# Patient Record
Sex: Male | Born: 1945 | Race: White | Hispanic: No | Marital: Married | Smoking: Former smoker
Health system: Southern US, Community
[De-identification: ages and names within clinical notes are randomized; demographics above are authoritative.]

## PROBLEM LIST (undated history)

## (undated) DIAGNOSIS — I509 Heart failure, unspecified: Secondary | ICD-10-CM

## (undated) DIAGNOSIS — E119 Type 2 diabetes mellitus without complications: Secondary | ICD-10-CM

## (undated) DIAGNOSIS — Z85528 Personal history of other malignant neoplasm of kidney: Secondary | ICD-10-CM

## (undated) DIAGNOSIS — N189 Chronic kidney disease, unspecified: Secondary | ICD-10-CM

## (undated) DIAGNOSIS — Z86718 Personal history of other venous thrombosis and embolism: Secondary | ICD-10-CM

## (undated) HISTORY — PX: NEPHRECTOMY: SHX65

## (undated) HISTORY — DX: Personal history of other venous thrombosis and embolism: Z86.718

## (undated) HISTORY — PX: APPENDECTOMY: SHX54

## (undated) HISTORY — DX: Chronic kidney disease, unspecified: N18.9

## (undated) HISTORY — DX: Personal history of other malignant neoplasm of kidney: Z85.528

## (undated) HISTORY — DX: Type 2 diabetes mellitus without complications: E11.9

---

## 1999-05-23 ENCOUNTER — Encounter: Payer: Self-pay | Admitting: Urology

## 1999-05-23 ENCOUNTER — Ambulatory Visit (HOSPITAL_COMMUNITY): Admission: RE | Admit: 1999-05-23 | Discharge: 1999-05-23 | Payer: Self-pay | Admitting: Urology

## 2002-04-09 ENCOUNTER — Encounter: Admission: RE | Admit: 2002-04-09 | Discharge: 2002-04-09 | Payer: Self-pay | Admitting: Urology

## 2002-04-09 ENCOUNTER — Encounter: Payer: Self-pay | Admitting: Urology

## 2005-12-22 ENCOUNTER — Emergency Department (HOSPITAL_COMMUNITY): Admission: EM | Admit: 2005-12-22 | Discharge: 2005-12-22 | Payer: Self-pay | Admitting: Emergency Medicine

## 2010-06-08 DEATH — deceased

## 2016-05-09 ENCOUNTER — Other Ambulatory Visit: Payer: Self-pay | Admitting: *Deleted

## 2016-05-09 DIAGNOSIS — R202 Paresthesia of skin: Secondary | ICD-10-CM

## 2016-05-29 ENCOUNTER — Ambulatory Visit (INDEPENDENT_AMBULATORY_CARE_PROVIDER_SITE_OTHER): Payer: 59 | Admitting: Neurology

## 2016-05-29 DIAGNOSIS — G629 Polyneuropathy, unspecified: Secondary | ICD-10-CM

## 2016-05-29 DIAGNOSIS — R202 Paresthesia of skin: Secondary | ICD-10-CM | POA: Diagnosis not present

## 2016-05-29 NOTE — Procedures (Signed)
[facility]  [place], [place]  [place], [place] [address] Tel: [phone] Fax:  [phone] Test Date:  05/29/2016  Patient: Melvin Ramirez DOB: [birth year] Physician: Narda Amber, DO  Sex: Male Height: 6\' 2"  Ref Phys: [name]  ID#: [id number] Temp: 32.0C Technician:    Patient Complaints: This is a 70 year-old male referred for paresthesias of the feet.   NCV & EMG Findings: Extensive electrodiagnostic testing of the right lower extremity and additional studies of the left shows: 1. Bilateral superficial peroneal sensory responses are absent. Bilateral sural sensory responses are within normal limits. 2. Right peroneal and tibial motor responses are within normal limits. Left peroneal and tibial motor responses show modest conduction velocity slowing; the left peroneal motor amplitude is also reduced. 3. Right tibial H reflex study is absent. Left tibial H reflex study is prolonged. 4. Modest chronic motor axon loss changes are seen affecting the tibialis anterior and flexor digitorum longus muscles, without accompanied active denervation.  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 Overall, these findings are moderate in degree electrically.    ___________________________ Narda Amber, DO    Nerve Conduction Studies Anti Sensory Summary Table   Site NR Peak (ms) Norm Peak (ms) P-T Amp (V) Norm P-T Amp  Left Sup Peroneal Anti Sensory (Ant Lat Mall)  12 cm NR  <4.6  >3  Right Sup Peroneal Anti Sensory (Ant Lat Mall)  12 cm NR  <4.6  >3  Left Sural Anti Sensory (Lat Mall)  Calf    4.8 <4.6 3.7 >3  Right Sural Anti Sensory (Lat Mall)  Calf    4.2 <4.6 3.5 >3   Motor Summary Table   Site NR Onset (ms) Norm Onset (ms) O-P Amp (mV) Norm O-P Amp Site1 Site2 Delta-0 (ms) Dist (cm) Vel (m/s) Norm Vel (m/s)    Left Peroneal Motor (Ext Dig Brev)  Ankle    4.6 <6.0 1.3 >2.5 B Fib Ankle 12.3 38.0 31 >40  B Fib    16.9  1.4  Poplt B Fib 1.9 8.0 42 >40  Poplt    18.8  1.1         Right Peroneal Motor (Ext Dig Brev)  Ankle    3.0 <6.0 5.2 >2.5 B Fib Ankle 10.4 38.0 37 >40  B Fib    13.4  4.2  Poplt B Fib 0.9 8.0 89 >40  Poplt    14.3  4.0         Left Tibial Motor (Abd Hall Brev)  Ankle    4.6 <6.0 2.5 >4 Knee Ankle 0.5 0.0  >40  Knee    4.1  5.0         Site 3    15.8  2.9         Right Tibial Motor (Abd Hall Brev)  Ankle    4.2 <6.0 4.3 >4 Knee Ankle 11.0 45.0 41 >40  Knee    15.2  2.8          H Reflex Studies   NR H-Lat (ms) Lat Norm (ms) L-R H-Lat (ms)  Left Tibial (Gastroc)     52.11 <35   Right Tibial (Gastroc)  NR  <35    EMG   Side Muscle Ins Act Fibs Psw Fasc Number Recrt Dur Dur. Amp Amp. Poly Poly. Comment  Right AntTibialis Nml Nml Nml Nml 1- Rapid  Few 1+ Few 1+ Nml Nml N/A  Right Gastroc Nml Nml Nml Nml Nml Nml Nml Nml Nml Nml Nml Nml N/A  Right Flex Dig Long Nml Nml Nml Nml 1- Rapid Some 1+ Some 1+ Nml Nml N/A  Right RectFemoris Nml Nml Nml Nml Nml Nml Nml Nml Nml Nml Nml Nml N/A  Right GluteusMed Nml Nml Nml Nml Nml Nml Nml Nml Nml Nml Nml Nml N/A      Waveforms:

## 2016-07-05 ENCOUNTER — Ambulatory Visit (INDEPENDENT_AMBULATORY_CARE_PROVIDER_SITE_OTHER): Payer: 59 | Admitting: Neurology

## 2016-07-05 ENCOUNTER — Encounter: Payer: Self-pay | Admitting: Neurology

## 2016-07-05 ENCOUNTER — Other Ambulatory Visit: Payer: Self-pay | Admitting: Neurology

## 2016-07-05 ENCOUNTER — Other Ambulatory Visit (INDEPENDENT_AMBULATORY_CARE_PROVIDER_SITE_OTHER): Payer: 59

## 2016-07-05 VITALS — BP 110/80 | HR 57 | Ht 74.0 in | Wt 256.5 lb

## 2016-07-05 DIAGNOSIS — E0842 Diabetes mellitus due to underlying condition with diabetic polyneuropathy: Secondary | ICD-10-CM | POA: Insufficient documentation

## 2016-07-05 LAB — VITAMIN B12: Vitamin B-12: 274 pg/mL (ref 211–911)

## 2016-07-05 NOTE — Progress Notes (Signed)
Note routed

## 2016-07-05 NOTE — Patient Instructions (Addendum)
1.  Check blood work  2.  Continue with your diet and exercise plan to control diabetes  Return to clinic as needed, if your symptoms get worse

## 2016-07-05 NOTE — Progress Notes (Signed)
[facility] Clinic Note - Initial Visit   Date: 07/05/16  Melvin Ramirez MRN: [id number] DOB: May 30, 1946   Dear Dr. Moreen Fowler:  Thank you for your kind referral of Melvin Ramirez for consultation of neuropathy. Although his history is well known to you, please allow Melvin Ramirez to reiterate it for the purpose of our medical record. The patient was accompanied to the clinic by wife who also provides collateral information.     History of Present Illness: Melvin Ramirez is a 70 y.o. right-handed Caucasian male with history of renal cell carcinoma s/p left nephrectomy (1999), former smoker, and diet controlled diabetes mellitus (HbA1c 6.5) presenting for evaluation of neuropathy.    Starting around 2015, he began feeling needle sensation over the soles which was worse in the morning.  Within about 15 minutes, they would improve.  He no longer has tingling sensation, but now has constant feeling of always wearing sock. Sensation of numbness does not involve the dorsum of the foot or above the ankles.  He does not have weakness or low back pain.  He walk independently, but endorses imbalance.  Fortunately, he has not had any stumbles or falls.  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 Overall, these findings are moderate in degree electrically.  Labs 05/07/2016:  TSH 1.98, HbA1c 6.5  Past Medical History:  Diagnosis Date  . Diabetes mellitus ([place])   . History of kidney cancer     Past Surgical History:  Procedure Laterality Date  . NEPHRECTOMY       Medications:  Outpatient Encounter Prescriptions as of 07/05/2016  Medication Sig Note  . Ascorbic Acid (VITAMIN C) 100 MG tablet as directed  07/05/2016: Received from: [facility]  . calcium gluconate 1 g, magnesium sulfate 1 g in sodium chloride 0.9 % 100 mL 1 tab 07/05/2016: Received from: [facility]  . esomeprazole (NEXIUM 24HR) 20 MG capsule one capsule 30 minutes before dinner 07/05/2016: Received from: [facility]  . Garlic 409 MG CAPS as directed 07/05/2016: Received from: [facility]  . Glucosamine 500 MG CAPS as directed 07/05/2016: Received from: [facility]  . Multiple Vitamin (MULTIVITAMIN) capsule as directed 07/05/2016: Received from: [facility]  . Omega-3 Fatty Acids (FISH OIL) 1000 MG CAPS 1 capsule 07/05/2016: Received from: [facility]  . Saw Palmetto 500 MG CAPS as directed 07/05/2016: Received from: [facility]   No facility-administered encounter medications on file as of 07/05/2016.      Allergies:  Allergies  Allergen Reactions  . Other     Other reaction(s): passes out    Family History: Family History  Problem Relation Age of Onset  . Cancer Mother   . Heart attack Father   . Heart attack Brother     Social History: Social History  Substance Use Topics  . Smoking status: Former Smoker    Packs/day: 2.00    Years: 35.00    Types: Cigarettes    Quit date: 2000  . Smokeless tobacco: Never Used  . Alcohol use Yes     Comment: Socially   Social History   Social History Narrative   Lives  with wife in a one story home.  Has 3 children.  Works as a Administrator.  Education: 12th grade.    Review of Systems:  CONSTITUTIONAL: No fevers, chills, night sweats, or weight loss.   EYES: No visual changes or eye pain ENT: No hearing changes.  No history of nose bleeds.   RESPIRATORY: No cough, wheezing and shortness of breath.   CARDIOVASCULAR: Negative for chest pain, and palpitations.   GI: Negative for abdominal  discomfort, blood in stools or black stools.  No recent change in bowel habits.   GU:  No history of incontinence.   MUSCLOSKELETAL: No history of joint pain or swelling.  No myalgias.   SKIN: Negative for lesions, rash, and itching.   HEMATOLOGY/ONCOLOGY: Negative for prolonged bleeding, bruising easily, and swollen nodes.  +history of cancer.   ENDOCRINE: Negative for cold or heat intolerance, polydipsia or goiter.   PSYCH:  No depression or anxiety symptoms.   NEURO: As Above.   Vital Signs:  BP 110/80   Pulse (!) 57   Ht 6\' 2"  (1.88 m) Comment: 2  Wt 256 lb 8 oz (116.3 kg)   SpO2 95%   BMI 32.93 kg/m    General Medical Exam:   General:  Well appearing, comfortable.   Eyes/ENT: see cranial nerve examination.   Neck: No masses appreciated.  Full range of motion without tenderness.  No carotid bruits. Respiratory:  Clear to auscultation, good air entry bilaterally.   Cardiac:  Regular rate and rhythm, no murmur.   Extremities:  No deformities, edema, or skin discoloration.  Skin:  No rashes or lesions.  Neurological Exam: MENTAL STATUS including orientation to time, place, person, recent and remote memory, attention span and concentration, language, and fund of knowledge is normal.  Speech is not dysarthric.  CRANIAL NERVES: II:  No visual field defects.  Unremarkable fundi.   III-IV-VI: Pupils equal round and reactive to light.  Normal conjugate, extra-ocular eye movements in all directions of gaze.  No nystagmus.  No ptosis.   V:  Normal facial sensation.    VII:  Normal facial symmetry and movements.  No pathologic facial reflexes.  VIII:  Normal hearing and vestibular function.   IX-X:  Normal palatal movement.   XI:  Normal shoulder shrug and head rotation.   XII:  Normal tongue strength and range of motion, no deviation or fasciculation.  MOTOR:  No atrophy, fasciculations or abnormal movements.  No pronator drift.  Tone is normal.    Right Upper Extremity:    Left  Upper Extremity:    Deltoid  5/5   Deltoid  5/5   Biceps  5/5   Biceps  5/5   Triceps  5/5   Triceps  5/5   Wrist extensors  5/5   Wrist extensors  5/5   Wrist flexors  5/5   Wrist flexors  5/5   Finger extensors  5/5   Finger extensors  5/5   Finger flexors  5/5   Finger flexors  5/5   Dorsal interossei  5/5   Dorsal interossei  5/5   Abductor pollicis  5/5   Abductor pollicis  5/5   Tone (Ashworth scale)  0  Tone (Ashworth scale)  0   Right Lower Extremity:    Left Lower Extremity:    Hip flexors  5/5   Hip flexors  5/5   Hip extensors  5/5   Hip extensors  5/5   Knee flexors  5/5  Knee flexors  5/5   Knee extensors  5/5   Knee extensors  5/5   Dorsiflexors  5/5   Dorsiflexors  5/5   Plantarflexors  5/5   Plantarflexors  5/5   Toe extensors  5/5   Toe extensors  5/5   Toe flexors  5/5   Toe flexors  5/5   Tone (Ashworth scale)  0  Tone (Ashworth scale)  0   MSRs:  Right                                                                 Left brachioradialis 2+  brachioradialis 2+  biceps 2+  biceps 2+  triceps 2+  triceps 2+  patellar 2+  patellar 2+  ankle jerk 1+  ankle jerk 1+  Hoffman no  Hoffman no  plantar response down  plantar response down   SENSORY:  Diminished temperature distal to ankles and markedly reduced vibration at the great toe (worse on the left).  Pin prick, light touch, and proprioception intact.  Romberg's sign is present.   COORDINATION/GAIT: Normal finger-to- nose-finger.  Intact rapid alternating movements bilaterally.  Able to rise from a chair without using arms.  Gait narrow based and stable. Mild unsteadiness with tandem gait, but able to perform.  Stressed gait intact.   IMPRESSION: Mr. Self is a 70 year-old male with bilateral feet paresthesias. His neurological examination shows a distal predominant large fiber peripheral neuropathy.  He was recently diagnosed with diabetes which he is trying to manage with diet and lifestyle modification, and I  suspect this is the primary cause for neuropathy. I had extensive discussion with the patient regarding the pathogenesis, etiology, management, and natural course of neuropathy. Neuropathy tends to be slowly progressive, especially if underlying etiology is adequately managed.  At this time, he does not have any painful paresthesias and gait is not severe where he needs therapy.  Certainly, if symptoms progress, this can be readdressed.   PLAN/RECOMMENDATIONS:  1.  Check vitamin B12, vitamin B1, copper, SPEP with IFE 2.  Lifestyle modification for diabetes encouraged and is scheduled to follow-up with his PCP next week 3.  Fall precautions discussed  Return to clinic as needed.   The duration of this appointment visit was 50 minutes of face-to-face time with the patient.  Greater than 50% of this time was spent in counseling, explanation of diagnosis, planning of further management, and coordination of care.   Thank you for allowing me to participate in patient's care.  If I can answer any additional questions, I would be pleased to do so.    Sincerely,    Donika K. Posey Pronto, DO

## 2016-07-07 LAB — COPPER, SERUM: COPPER: 78 ug/dL (ref 72–166)

## 2016-07-08 LAB — VITAMIN B1: VITAMIN B1 (THIAMINE): 13 nmol/L (ref 8–30)

## 2016-07-09 LAB — PROTEIN ELECTROPHORESIS, SERUM
ALBUMIN ELP: 4 g/dL (ref 3.8–4.8)
ALPHA-2-GLOBULIN: 0.6 g/dL (ref 0.5–0.9)
Alpha-1-Globulin: 0.2 g/dL (ref 0.2–0.3)
Beta 2: 0.3 g/dL (ref 0.2–0.5)
Beta Globulin: 0.4 g/dL (ref 0.4–0.6)
Gamma Globulin: 1.1 g/dL (ref 0.8–1.7)
Total Protein, Serum Electrophoresis: 6.6 g/dL (ref 6.1–8.1)

## 2016-07-10 LAB — IMMUNOFIXATION ELECTROPHORESIS
IGA: 269 mg/dL (ref 81–463)
IGG (IMMUNOGLOBIN G), SERUM: 1400 mg/dL (ref 694–1618)
IgM, Serum: 56 mg/dL (ref 48–271)

## 2016-07-11 ENCOUNTER — Telehealth: Payer: Self-pay | Admitting: Neurology

## 2016-07-11 NOTE — Telephone Encounter (Signed)
-----   Message from Alda Berthold, DO sent at 07/11/2016 12:47 PM EDT ----- Melvin Ramirez, please inform patient that his vitamin B12 level is low-normal at 274 (normal range 211-911) and with his neuropathy I would like for him to start vitamin B12 1079mcg oral supplements daily.  Thanks.

## 2016-07-11 NOTE — Telephone Encounter (Signed)
Left message on machine for patient to call back.

## 2016-07-12 NOTE — Telephone Encounter (Signed)
Mychart message sent to patient.

## 2016-07-13 NOTE — Telephone Encounter (Signed)
Wife called back and I made her aware.

## 2016-08-06 ENCOUNTER — Ambulatory Visit: Payer: 59 | Admitting: Neurology

## 2017-05-08 DEATH — deceased

## 2018-06-23 DIAGNOSIS — N401 Enlarged prostate with lower urinary tract symptoms: Secondary | ICD-10-CM | POA: Diagnosis not present

## 2018-06-23 DIAGNOSIS — Z0000 Encounter for general adult medical examination without abnormal findings: Secondary | ICD-10-CM | POA: Diagnosis not present

## 2018-06-23 DIAGNOSIS — Z125 Encounter for screening for malignant neoplasm of prostate: Secondary | ICD-10-CM | POA: Diagnosis not present

## 2018-06-23 DIAGNOSIS — E1140 Type 2 diabetes mellitus with diabetic neuropathy, unspecified: Secondary | ICD-10-CM | POA: Diagnosis not present

## 2018-06-23 DIAGNOSIS — E7800 Pure hypercholesterolemia, unspecified: Secondary | ICD-10-CM | POA: Diagnosis not present

## 2018-06-23 DIAGNOSIS — R202 Paresthesia of skin: Secondary | ICD-10-CM | POA: Diagnosis not present

## 2018-06-23 DIAGNOSIS — Z1211 Encounter for screening for malignant neoplasm of colon: Secondary | ICD-10-CM | POA: Diagnosis not present

## 2018-06-23 DIAGNOSIS — K219 Gastro-esophageal reflux disease without esophagitis: Secondary | ICD-10-CM | POA: Diagnosis not present

## 2018-06-23 DIAGNOSIS — Z85528 Personal history of other malignant neoplasm of kidney: Secondary | ICD-10-CM | POA: Diagnosis not present

## 2018-11-25 DIAGNOSIS — J0110 Acute frontal sinusitis, unspecified: Secondary | ICD-10-CM | POA: Diagnosis not present

## 2019-07-16 DIAGNOSIS — N529 Male erectile dysfunction, unspecified: Secondary | ICD-10-CM | POA: Diagnosis not present

## 2019-07-16 DIAGNOSIS — R202 Paresthesia of skin: Secondary | ICD-10-CM | POA: Diagnosis not present

## 2019-07-16 DIAGNOSIS — Z1211 Encounter for screening for malignant neoplasm of colon: Secondary | ICD-10-CM | POA: Diagnosis not present

## 2019-07-16 DIAGNOSIS — N401 Enlarged prostate with lower urinary tract symptoms: Secondary | ICD-10-CM | POA: Diagnosis not present

## 2019-07-16 DIAGNOSIS — Z85528 Personal history of other malignant neoplasm of kidney: Secondary | ICD-10-CM | POA: Diagnosis not present

## 2019-07-16 DIAGNOSIS — E1140 Type 2 diabetes mellitus with diabetic neuropathy, unspecified: Secondary | ICD-10-CM | POA: Diagnosis not present

## 2019-07-16 DIAGNOSIS — Z125 Encounter for screening for malignant neoplasm of prostate: Secondary | ICD-10-CM | POA: Diagnosis not present

## 2019-07-16 DIAGNOSIS — Z0000 Encounter for general adult medical examination without abnormal findings: Secondary | ICD-10-CM | POA: Diagnosis not present

## 2019-07-16 DIAGNOSIS — K219 Gastro-esophageal reflux disease without esophagitis: Secondary | ICD-10-CM | POA: Diagnosis not present

## 2019-07-16 DIAGNOSIS — D692 Other nonthrombocytopenic purpura: Secondary | ICD-10-CM | POA: Diagnosis not present

## 2019-07-16 DIAGNOSIS — E7800 Pure hypercholesterolemia, unspecified: Secondary | ICD-10-CM | POA: Diagnosis not present

## 2019-08-18 DIAGNOSIS — N401 Enlarged prostate with lower urinary tract symptoms: Secondary | ICD-10-CM | POA: Diagnosis not present

## 2019-08-18 DIAGNOSIS — R350 Frequency of micturition: Secondary | ICD-10-CM | POA: Diagnosis not present

## 2019-10-22 DIAGNOSIS — B349 Viral infection, unspecified: Secondary | ICD-10-CM | POA: Diagnosis not present

## 2019-10-23 ENCOUNTER — Other Ambulatory Visit: Payer: Self-pay | Admitting: Unknown Physician Specialty

## 2019-10-23 ENCOUNTER — Encounter (HOSPITAL_COMMUNITY): Payer: Self-pay

## 2019-10-23 ENCOUNTER — Telehealth: Payer: Self-pay | Admitting: Nurse Practitioner

## 2019-10-23 ENCOUNTER — Telehealth: Payer: Self-pay | Admitting: Unknown Physician Specialty

## 2019-10-23 ENCOUNTER — Ambulatory Visit (HOSPITAL_COMMUNITY)
Admission: RE | Admit: 2019-10-23 | Discharge: 2019-10-23 | Disposition: A | Discharge status: Home / Self Care | Payer: Commercial Managed Care - PPO | Source: Ambulatory Visit | Attending: Pulmonary Disease | Admitting: Pulmonary Disease

## 2019-10-23 DIAGNOSIS — E0842 Diabetes mellitus due to underlying condition with diabetic polyneuropathy: Secondary | ICD-10-CM

## 2019-10-23 DIAGNOSIS — E1169 Type 2 diabetes mellitus with other specified complication: Secondary | ICD-10-CM | POA: Insufficient documentation

## 2019-10-23 DIAGNOSIS — Z23 Encounter for immunization: Secondary | ICD-10-CM | POA: Diagnosis not present

## 2019-10-23 DIAGNOSIS — U071 COVID-19: Secondary | ICD-10-CM | POA: Insufficient documentation

## 2019-10-23 MED ORDER — DIPHENHYDRAMINE HCL 50 MG/ML IJ SOLN: 50.0000 mg | Freq: Once | INTRAMUSCULAR | Status: DC | PRN

## 2019-10-23 MED ORDER — FAMOTIDINE IN NACL 20-0.9 MG/50ML-% IV SOLN: 20.0000 mg | Freq: Once | INTRAVENOUS | Status: DC | PRN

## 2019-10-23 MED ORDER — METHYLPREDNISOLONE SODIUM SUCC 125 MG IJ SOLR: 125.0000 mg | Freq: Once | INTRAMUSCULAR | Status: DC | PRN

## 2019-10-23 MED ORDER — SODIUM CHLORIDE 0.9 % IV SOLN
700.0000 mg | Freq: Once | INTRAVENOUS | Status: AC
  Administered 2019-10-23: 700 mg via INTRAVENOUS
  Filled 2019-10-23: qty 20

## 2019-10-23 MED ORDER — EPINEPHRINE 0.3 MG/0.3ML IJ SOAJ: 0.3000 mg | Freq: Once | INTRAMUSCULAR | Status: DC | PRN

## 2019-10-23 MED ORDER — ALBUTEROL SULFATE HFA 108 (90 BASE) MCG/ACT IN AERS: 2.0000 | INHALATION_SPRAY | Freq: Once | RESPIRATORY_TRACT | Status: DC | PRN

## 2019-10-23 MED ORDER — ALBUTEROL SULFATE HFA 108 (90 BASE) MCG/ACT IN AERS
INHALATION_SPRAY | RESPIRATORY_TRACT | Status: AC
  Filled 2019-10-23: qty 6.7

## 2019-10-23 MED ORDER — SODIUM CHLORIDE 0.9 % IV SOLN
INTRAVENOUS | Status: DC | PRN
  Administered 2019-10-23: 250 mL via INTRAVENOUS

## 2019-10-23 MED ORDER — ACETAMINOPHEN 325 MG PO TABS
650.0000 mg | ORAL_TABLET | Freq: Once | ORAL | Status: AC
  Administered 2019-10-23: 650 mg via ORAL

## 2019-10-23 NOTE — Telephone Encounter (Signed)
Daughter Melvin Ramirez called to discuss with patient about Covid symptoms and the use of bamlanivimab, a monoclonal antibody infusion for those with mild to moderate Covid symptoms and at a high risk of hospitalization.  Pt is qualified for this infusion at the [facility] infusion center due to Age > 62 with co morbid conditions managed by primary care  Wife had infusion today and daughter would like infusion for daughter  Sx onset 10/14/2018.  Will put on the wait list for the weekend.

## 2019-10-23 NOTE — Progress Notes (Signed)
  I connected by phone with Melvin Ramirez on 10/23/2019 at 4:10 PM to discuss the potential use of an new treatment for mild to moderate COVID-19 viral infection in non-hospitalized patients.  This patient is a 74 y.o. male that meets the FDA criteria for Emergency Use Authorization of bamlanivimab or casirivimab\imdevimab.  Has a (+) direct SARS-CoV-2 viral test result  Has mild or moderate COVID-19   Is ? 72 years of age and weighs ? 40 kg  Is NOT hospitalized due to COVID-19  Is NOT requiring oxygen therapy or requiring an increase in baseline oxygen flow rate due to COVID-19  Is within 10 days of symptom onset  Has at least one of the high risk factor(s) for progression to severe COVID-19 and/or hospitalization as defined in EUA.  Specific high risk criteria : >/= 72 yo   I have spoken and communicated the following to the patient or parent/caregiver:  1. FDA has authorized the emergency use of bamlanivimab and casirivimab\imdevimab for the treatment of mild to moderate COVID-19 in adults and pediatric patients with positive results of direct SARS-CoV-2 viral testing who are 25 years of age and older weighing at least 40 kg, and who are at high risk for progressing to severe COVID-19 and/or hospitalization.  2. The significant known and potential risks and benefits of bamlanivimab and casirivimab\imdevimab, and the extent to which such potential risks and benefits are unknown.  3. Information on available alternative treatments and the risks and benefits of those alternatives, including clinical trials.  4. Patients treated with bamlanivimab and casirivimab\imdevimab should continue to self-isolate and use infection control measures (e.g., wear mask, isolate, social distance, avoid sharing personal items, clean and disinfect "high touch" surfaces, and frequent handwashing) according to CDC guidelines.   5. The patient or parent/caregiver has the option to accept or refuse  bamlanivimab or casirivimab\imdevimab .  After reviewing this information with the patient, The patient agreed to proceed with receiving the bamlanimivab infusion and will be provided a copy of the Fact sheet prior to receiving the infusion.[name] 10/23/2019 4:10 PM

## 2019-10-23 NOTE — Discharge Instructions (Signed)

## 2019-10-23 NOTE — Progress Notes (Signed)
  Diagnosis: COVID-19  Physician: Joya Gaskins  Procedure: Covid Infusion Clinic Med: bamlanivimab infusion - Provided patient with bamlanimivab fact sheet for patients, parents and caregivers prior to infusion.  Complications: No immediate complications noted.  Discharge: Discharged home   Mason, [place] C 10/23/2019

## 2019-10-23 NOTE — Telephone Encounter (Signed)
Called to Discuss with patient about Covid symptoms and the use of bamlanivimab, a monoclonal antibody infusion for those with mild to moderate Covid symptoms and at a high risk of hospitalization.     Placed patient on the cancellation list for infusion for 10/24/19. His symptoms started 10/15/19

## 2019-10-23 NOTE — Progress Notes (Signed)
  Diagnosis: COVID-19  Physician: Dr. Joya Gaskins  Procedure: Covid Infusion Clinic Med: bamlanivimab infusion - Provided patient with bamlanimivab fact sheet for patients, parents and caregivers prior to infusion.  Complications: No immediate complications noted.  Discharge: Discharged home   Melvin Ramirez 10/23/2019

## 2019-10-26 ENCOUNTER — Emergency Department (HOSPITAL_COMMUNITY): Payer: Commercial Managed Care - PPO

## 2019-10-26 ENCOUNTER — Encounter (HOSPITAL_COMMUNITY): Payer: Self-pay | Admitting: Emergency Medicine

## 2019-10-26 ENCOUNTER — Other Ambulatory Visit: Payer: Self-pay

## 2019-10-26 ENCOUNTER — Emergency Department (HOSPITAL_COMMUNITY)
Admission: EM | Admit: 2019-10-26 | Discharge: 2019-10-26 | Disposition: A | Discharge status: Home / Self Care | Payer: Commercial Managed Care - PPO | Attending: Emergency Medicine | Admitting: Emergency Medicine

## 2019-10-26 DIAGNOSIS — R079 Chest pain, unspecified: Secondary | ICD-10-CM | POA: Diagnosis not present

## 2019-10-26 DIAGNOSIS — U071 COVID-19: Secondary | ICD-10-CM | POA: Diagnosis not present

## 2019-10-26 DIAGNOSIS — Z20822 Contact with and (suspected) exposure to covid-19: Secondary | ICD-10-CM | POA: Diagnosis not present

## 2019-10-26 DIAGNOSIS — R0602 Shortness of breath: Secondary | ICD-10-CM | POA: Insufficient documentation

## 2019-10-26 DIAGNOSIS — Z85528 Personal history of other malignant neoplasm of kidney: Secondary | ICD-10-CM | POA: Diagnosis not present

## 2019-10-26 DIAGNOSIS — R000 Tachycardia, unspecified: Secondary | ICD-10-CM | POA: Diagnosis not present

## 2019-10-26 DIAGNOSIS — E119 Type 2 diabetes mellitus without complications: Secondary | ICD-10-CM | POA: Insufficient documentation

## 2019-10-26 DIAGNOSIS — Z87891 Personal history of nicotine dependence: Secondary | ICD-10-CM | POA: Diagnosis not present

## 2019-10-26 DIAGNOSIS — I493 Ventricular premature depolarization: Secondary | ICD-10-CM | POA: Diagnosis not present

## 2019-10-26 DIAGNOSIS — R05 Cough: Secondary | ICD-10-CM | POA: Diagnosis not present

## 2019-10-26 LAB — COMPREHENSIVE METABOLIC PANEL
ALT: 46 U/L — ABNORMAL HIGH (ref 0–44)
AST: 65 U/L — ABNORMAL HIGH (ref 15–41)
Albumin: 2.8 g/dL — ABNORMAL LOW (ref 3.5–5.0)
Alkaline Phosphatase: 91 U/L (ref 38–126)
Anion gap: 11 (ref 5–15)
BUN: 24 mg/dL — ABNORMAL HIGH (ref 8–23)
CO2: 21 mmol/L — ABNORMAL LOW (ref 22–32)
Calcium: 8.5 mg/dL — ABNORMAL LOW (ref 8.9–10.3)
Chloride: 105 mmol/L (ref 98–111)
Creatinine, Ser: 1.61 mg/dL — ABNORMAL HIGH (ref 0.61–1.24)
GFR calc Af Amer: 49 mL/min — ABNORMAL LOW (ref >60)
GFR calc non Af Amer: 42 mL/min — ABNORMAL LOW (ref >60)
Glucose, Bld: 150 mg/dL — ABNORMAL HIGH (ref 70–99)
Potassium: 4.4 mmol/L (ref 3.5–5.1)
Sodium: 137 mmol/L (ref 135–145)
Total Bilirubin: 1.6 mg/dL — ABNORMAL HIGH (ref 0.3–1.2)
Total Protein: 6.1 g/dL — ABNORMAL LOW (ref 6.5–8.1)

## 2019-10-26 LAB — CBC WITH DIFFERENTIAL/PLATELET
Abs Immature Granulocytes: 0.05 10*3/uL (ref 0.00–0.07)
Basophils Absolute: 0 10*3/uL (ref 0.0–0.1)
Basophils Relative: 0 %
Eosinophils Absolute: 0.1 10*3/uL (ref 0.0–0.5)
Eosinophils Relative: 1 %
HCT: 45.6 % (ref 39.0–52.0)
Hemoglobin: 15.4 g/dL (ref 13.0–17.0)
Immature Granulocytes: 1 %
Lymphocytes Relative: 9 %
Lymphs Abs: 0.7 10*3/uL (ref 0.7–4.0)
MCH: 31.4 pg (ref 26.0–34.0)
MCHC: 33.8 g/dL (ref 30.0–36.0)
MCV: 92.9 fL (ref 80.0–100.0)
Monocytes Absolute: 0.6 10*3/uL (ref 0.1–1.0)
Monocytes Relative: 8 %
Neutro Abs: 6.4 10*3/uL (ref 1.7–7.7)
Neutrophils Relative %: 81 %
Platelets: 169 10*3/uL (ref 150–400)
RBC: 4.91 MIL/uL (ref 4.22–5.81)
RDW: 12.5 % (ref 11.5–15.5)
WBC: 7.9 10*3/uL (ref 4.0–10.5)
nRBC: 0 % (ref 0.0–0.2)

## 2019-10-26 LAB — POC SARS CORONAVIRUS 2 AG -  ED: SARS Coronavirus 2 Ag: NEGATIVE

## 2019-10-26 LAB — TROPONIN I (HIGH SENSITIVITY)
Troponin I (High Sensitivity): 24 ng/L — ABNORMAL HIGH (ref <18)
Troponin I (High Sensitivity): 26 ng/L — ABNORMAL HIGH (ref <18)

## 2019-10-26 LAB — D-DIMER, QUANTITATIVE: D-Dimer, Quant: 1.34 ug/mL-FEU — ABNORMAL HIGH (ref 0.00–0.50)

## 2019-10-26 MED ORDER — IOHEXOL 350 MG/ML SOLN
80.0000 mL | Freq: Once | INTRAVENOUS | Status: AC | PRN
  Administered 2019-10-26: 80 mL via INTRAVENOUS

## 2019-10-26 MED ORDER — SODIUM CHLORIDE 0.9 % IV BOLUS
1000.0000 mL | Freq: Once | INTRAVENOUS | Status: AC
  Administered 2019-10-26: 21:00:00 1000 mL via INTRAVENOUS

## 2019-10-26 NOTE — ED Triage Notes (Signed)
Pt st's he tested positive for Covid on Fri.  St's he went back to [facility] on Friday evening and received a infusion.  Pt st's he has continued to have cough and shortness of breath and his MD told him to come to ED for CXR and possible steroids.

## 2019-10-26 NOTE — ED Provider Notes (Signed)
Medical screening examination/treatment/procedure(s) were conducted as a shared visit with non-physician practitioner(s) and myself.  I personally evaluated the patient during the encounter.    74 year old male with chest pain.  He is having chest pain on exhalation.  He has been coughing.  Recently tested positive for Covid.  He was treated with bamlanivimab on 10/23/2019.  Last few days she has developed this pain when he exhales or coughs. No fevers or chills.  No unusual leg pain or swelling.  Pain sounds pleuritic to me.  Not unsurprising given his recent Covid diagnosis.  CTA without evidence of PE.  Infiltrates as expected with Covid.  He is afebrile.  O2 sats are normal on room air.  He does not look distressed.  Troponin is just minimally elevated.  Will repeat.  He is having a lot of ectopy and sometimes short runs of nonsustained ventricular tachycardia.  He is asymptomatic in this regard though and does not require further management at this time.  Melvin Ramirez was evaluated in Emergency Department on 10/26/2019 for the symptoms described in the history of present illness. He was evaluated in the context of the global COVID-19 pandemic, which necessitated consideration that the patient might be at risk for infection with the SARS-CoV-2 virus that causes COVID-19. Institutional protocols and algorithms that pertain to the evaluation of patients at risk for COVID-19 are in a state of rapid change based on information released by regulatory bodies including the CDC and federal and state organizations. These policies and algorithms were followed during the patient's care in the ED.    Virgel Manifold, MD 10/26/19 [id number]

## 2019-10-26 NOTE — Discharge Instructions (Addendum)
Today your CT scan on your chest did not show any blood clots. Your heart enzyme is slightly elevated, however this is most likely due to Covid.   You did have multiple abnormal heart rhythms today.  Given that you do not have significant symptoms from this we will still discharge you however you need to schedule an appointment with cardiology to follow-up as an outpatient.  If your chest pain changes, you develop worsening symptoms, or have additional concerns please seek additional medical care and evaluation.  If you are using stimulants such as caffeine, decongesting medications, or energy drinks it is important that you slowly stop these over the next week.  Please make sure you are drinking plenty of water.  Your renal function is slightly low.  Please follow up with your primary care doctor.

## 2019-10-26 NOTE — ED Notes (Signed)
Melvin Ramirez wife [phone]

## 2019-10-26 NOTE — ED Notes (Signed)
Patient transported to CT 

## 2019-10-26 NOTE — ED Provider Notes (Signed)
[facility] EMERGENCY DEPARTMENT Provider Note   CSN: [id number] Arrival date & time: 10/26/19  1642     History Chief Complaint  Patient presents with  . Covid Positive  . Cough    Melvin Ramirez is a 74 y.o. male with past medical history of diabetes, kidney cancer, who presents today for evaluation of cough and shortness of breath.  He tested positive for coronavirus on Friday.  He developed symptoms on 10/15/2019.  On 10/23/2019 he received outpatient monoclonal antibody infusion for Covid. He reports that today he was on the phone with his doctor and after hearing and cough his doctor recommended he come in for a chest x-ray and "possibly steroids."  Patient denies any pulmonary or cardiac history.  He denies any nausea, vomiting, or diarrhea.  He reports that he has chest pain in the middle of his chest, primarily when he exhales.  No alleviating factors noted.   HPI     Past Medical History:  Diagnosis Date  . Diabetes mellitus ([place])   . History of kidney cancer     Patient Active Problem List   Diagnosis Date Noted  . Diabetic polyneuropathy associated with diabetes mellitus due to underlying condition ([place]) 07/05/2016    Past Surgical History:  Procedure Laterality Date  . NEPHRECTOMY         Family History  Problem Relation Age of Onset  . Cancer Mother   . Heart attack Father   . Heart attack Brother     Social History   Tobacco Use  . Smoking status: Former Smoker    Packs/day: 2.00    Years: 35.00    Pack years: 70.00    Types: Cigarettes    Quit date: 2000    Years since quitting: 21.0  . Smokeless tobacco: Never Used  Substance Use Topics  . Alcohol use: Yes    Comment: Socially  . Drug use: No    Home Medications Prior to Admission medications   Medication Sig Start Date End Date Taking? Authorizing Provider  Ascorbic Acid (VITAMIN C) 100 MG tablet as directed    [provider]  calcium gluconate 1 g,  magnesium sulfate 1 g in sodium chloride 0.9 % 100 mL 1 tab    [provider]  esomeprazole (NEXIUM 24HR) 20 MG capsule one capsule 30 minutes before dinner    [provider]  Garlic 620 MG CAPS as directed    [provider]  Glucosamine 500 MG CAPS as directed    [provider]  Multiple Vitamin (MULTIVITAMIN) capsule as directed    [provider]  Omega-3 Fatty Acids (FISH OIL) 1000 MG CAPS 1 capsule    [provider]  Saw Palmetto 500 MG CAPS as directed    [provider]    Allergies    Novocain [procaine] and Other  Review of Systems   Review of Systems  Constitutional: Positive for fatigue. Negative for chills and fever.  Respiratory: Positive for cough and shortness of breath.   Cardiovascular: Positive for chest pain. Negative for palpitations and leg swelling.  Gastrointestinal: Negative for abdominal pain, diarrhea, nausea and vomiting.  Genitourinary: Negative for dysuria.  Musculoskeletal: Negative for back pain and neck pain.  Skin: Negative for color change, rash and wound.  Neurological: Negative for light-headedness and headaches.  Psychiatric/Behavioral: Negative for confusion.  All other systems reviewed and are negative.   Physical Exam Updated Vital Signs BP 129/87   Pulse Marland Kitchen)  34   Temp 98.2 F (36.8 C) (Oral)   Resp 20   Ht 6\' 1"  (1.854 m)   Wt 115.7 kg   SpO2 97%   BMI 33.64 kg/m   Physical Exam Vitals and nursing note reviewed.  Constitutional:      General: He is not in acute distress.    Appearance: He is well-developed. He is not diaphoretic.  HENT:     Head: Normocephalic and atraumatic.  Eyes:     General: No scleral icterus.       Right eye: No discharge.        Left eye: No discharge.     Conjunctiva/sclera: Conjunctivae normal.  Cardiovascular:     Rate and Rhythm: Tachycardia present. Rhythm irregular.     Pulses: Normal pulses.     Heart sounds: Normal heart  sounds.  Pulmonary:     Effort: Pulmonary effort is normal. No respiratory distress.     Breath sounds: Normal breath sounds. No stridor. No wheezing, rhonchi or rales.  Chest:     Chest wall: No tenderness.     Comments: Unable to recreate or exacerbate his reported pain with palpation. Abdominal:     General: There is no distension.     Palpations: Abdomen is soft.     Tenderness: There is no abdominal tenderness. There is no guarding.  Musculoskeletal:        General: No deformity.     Cervical back: Normal range of motion and neck supple.     Right lower leg: No edema.     Left lower leg: No edema.  Skin:    General: Skin is warm and dry.  Neurological:     General: No focal deficit present.     Mental Status: He is alert. Mental status is at baseline.     Motor: No abnormal muscle tone.  Psychiatric:        Mood and Affect: Mood normal.        Behavior: Behavior normal.     ED Results / Procedures / Treatments   Labs (all labs ordered are listed, but only abnormal results are displayed) Labs Reviewed  COMPREHENSIVE METABOLIC PANEL - Abnormal; Notable for the following components:      Result Value   CO2 21 (*)    Glucose, Bld 150 (*)    BUN 24 (*)    Creatinine, Ser 1.61 (*)    Calcium 8.5 (*)    Total Protein 6.1 (*)    Albumin 2.8 (*)    AST 65 (*)    ALT 46 (*)    Total Bilirubin 1.6 (*)    GFR calc non Af Amer 42 (*)    GFR calc Af Amer 49 (*)    All other components within normal limits  D-DIMER, QUANTITATIVE (NOT AT [facility]) - Abnormal; Notable for the following components:   D-Dimer, Quant 1.34 (*)    All other components within normal limits  TROPONIN I (HIGH SENSITIVITY) - Abnormal; Notable for the following components:   Troponin I (High Sensitivity) 24 (*)    All other components within normal limits  TROPONIN I (HIGH SENSITIVITY) - Abnormal; Notable for the following components:   Troponin I (High Sensitivity) 26 (*)    All other components within  normal limits  CBC WITH DIFFERENTIAL/PLATELET  POC SARS CORONAVIRUS 2 AG -  ED    EKG None  Radiology CT Angio Chest PE W/Cm &/Or Wo Cm  Result Date: 10/26/2019 CLINICAL  DATA:  Tachycardia and fever. EXAM: CT ANGIOGRAPHY CHEST WITH CONTRAST TECHNIQUE: Multidetector CT imaging of the chest was performed using the standard protocol during bolus administration of intravenous contrast. Multiplanar CT image reconstructions and MIPs were obtained to evaluate the vascular anatomy. CONTRAST:  21mL OMNIPAQUE IOHEXOL 350 MG/ML SOLN COMPARISON:  None. FINDINGS: Cardiovascular: Satisfactory opacification of the pulmonary arteries to the segmental level. No evidence of pulmonary embolism. Normal heart size. No pericardial effusion. Mild coronary artery calcification is noted. Mediastinum/Nodes: There is mild pretracheal lymphadenopathy. A cluster of subcentimeter calcified right hilar lymph nodes is also seen. Lungs/Pleura: A 7 mm calcified lung nodule is seen within the posterolateral aspect of the right lower lobe. Mild-to-moderate severity partially ground-glass appearing infiltrates are seen throughout both lungs. Small bilateral pleural effusions are noted. No pneumothorax is identified. Upper Abdomen: No acute abnormality. Musculoskeletal: No chest wall abnormality. No acute or significant osseous findings. Review of the MIP images confirms the above findings. IMPRESSION: 1. No evidence of acute pulmonary embolism. 2. Mild-to-moderate severity partially ground-glass appearing infiltrates throughout both lungs, which may represent an infectious or inflammatory process. 3. Small bilateral pleural effusions. Electronically Signed   By: Virgina Norfolk M.D.   On: 10/26/2019 21:24   DG Chest Portable 1 View  Result Date: 10/26/2019 CLINICAL DATA:  COVID positive, cough and shortness of breath EXAM: PORTABLE CHEST 1 VIEW COMPARISON:  None FINDINGS: There are patchy bilateral opacities with a peripheral and lower  lung predominance. Left costophrenic angle is excluded. Otherwise, no pleural effusion. No pneumothorax. Cardiomediastinal contours are within normal limits. IMPRESSION: Patchy bilateral pulmonary opacities most consistent with COVID-19 pneumonia. Electronically Signed   By: Macy Mis M.D.   On: 10/26/2019 18:46    Procedures Procedures (including critical care time)  Medications Ordered in ED Medications  sodium chloride 0.9 % bolus 1,000 mL (1,000 mLs Intravenous New Bag/Given 10/26/19 2125)  iohexol (OMNIPAQUE) 350 MG/ML injection 80 mL (80 mLs Intravenous Contrast Given 10/26/19 2108)    ED Course  I have reviewed the triage vital signs and the nursing notes.  Pertinent labs & imaging results that were available during my care of the patient were reviewed by me and considered in my medical decision making (see chart for details). On room patient was noted to have multiple PVCs in various patterns including Bigeminy, and Trigeminy. When he stood to ambulate in place his oxygen remained 95% but his HR increased from 103 up to 125.  Clinical Course as of Oct 26 2303  Mon Oct 26, 2019  2048 Discussed plan with patient.    [EH]  2303 Inaccurate due to irregular rhythm.  Really 101  Pulse Rate(!): 34 [EH]    Clinical Course User Index [EH] Lorin Glass, PA-C   MDM Rules/Calculators/A&P                     Melvin Ramirez presents today for evaluation of continued cough and shortness of breath in the setting of Covid.  CXR shows findings consistent with Covid pneumonia. Labs are obtained and reviewed, CBC is unremarkable.  CMP significant for creatinine of 1.61 with a GFR of 42, uncertain what baseline is.  Mild transaminitis, suspect secondary to Covid.  Troponin x2 was obtained at 24 and 26.  Patient does report mild pleuritic type chest pain that has been going on for an extended period of time. Low suspicion for ACS.  He does show PVCs however he is asymptomatic with these.  No previous EKGs to compare to however I suspect this is patient's baseline. Recommended outpatient cardiology follow-up. D-dimer is slightly elevated.  Given his heart rate changes with position changes, known increased risk of VTE with covid, and elevated HR  CTA PE study was obtained without evidence of PE.  Lung changes consistent with Covid.  I ambulated patient in the room and he did not become hypoxic.  Patient will be discharged with recommendation to follow-up with both PCP and outpatient cardiology in addition to increasing p.o. fluid intake, especially over the next 1-2 days to help flush out the contrast.  He was given a 1 liter saline bolus also for this.   This patient was seen as a shared visit with Dr. Wilson Singer.   Patient has already been treated with monoclonal antibodies as an outpatient.   Return precautions were discussed with patient who states their understanding.  At the time of discharge patient denied any unaddressed complaints or concerns.  Patient is agreeable for discharge home.  Note: Portions of this report may have been transcribed using voice recognition software. Every effort was made to ensure accuracy; however, inadvertent computerized transcription errors may be present  Final Clinical Impression(s) / ED Diagnoses Final diagnoses:  COVID-19  Asymptomatic PVCs    Rx / DC Orders ED Discharge Orders    None       Ollen Gross 10/26/19 2311    Virgel Manifold, MD 10/26/19 2328

## 2019-10-26 NOTE — ED Notes (Signed)
Pt's wife would like MD to call her when he goes to give MD an update. Pt's wife is also requesting a CRP be drawn.

## 2019-11-02 DIAGNOSIS — I493 Ventricular premature depolarization: Secondary | ICD-10-CM | POA: Diagnosis not present

## 2019-11-02 DIAGNOSIS — J1282 Pneumonia due to coronavirus disease 2019: Secondary | ICD-10-CM | POA: Diagnosis not present

## 2019-11-02 DIAGNOSIS — U071 COVID-19: Secondary | ICD-10-CM | POA: Diagnosis not present

## 2019-11-11 ENCOUNTER — Telehealth: Payer: Self-pay | Admitting: Cardiology

## 2019-11-11 NOTE — Telephone Encounter (Signed)
New Message  Patient's wife is calling in to get approval to come in to the appointment with Dr. Gardiner Rhyme on 11/13/19. States that patient has a hard times remembering things and sometimes panics. Patient's wife states that she need to be there with patient because of this. Please give patient's wife a call back to confirm.

## 2019-11-12 NOTE — Telephone Encounter (Signed)
Left message (ok per DPR)-advised ok to come with patient to appt.

## 2019-11-13 ENCOUNTER — Ambulatory Visit: Payer: Medicare Other | Admitting: Cardiology

## 2019-11-15 ENCOUNTER — Emergency Department (HOSPITAL_COMMUNITY): Payer: Commercial Managed Care - PPO

## 2019-11-15 ENCOUNTER — Encounter (HOSPITAL_COMMUNITY): Payer: Self-pay | Admitting: Emergency Medicine

## 2019-11-15 ENCOUNTER — Other Ambulatory Visit: Payer: Self-pay

## 2019-11-15 ENCOUNTER — Inpatient Hospital Stay (HOSPITAL_COMMUNITY)
Admission: EM | Admit: 2019-11-15 | Discharge: 2019-11-26 | DRG: 291 | Disposition: A | Discharge status: Home / Self Care | Payer: Commercial Managed Care - PPO | Attending: Cardiovascular Disease | Admitting: Cardiovascular Disease

## 2019-11-15 ENCOUNTER — Inpatient Hospital Stay (HOSPITAL_COMMUNITY): Payer: Commercial Managed Care - PPO

## 2019-11-15 DIAGNOSIS — R05 Cough: Secondary | ICD-10-CM | POA: Diagnosis not present

## 2019-11-15 DIAGNOSIS — R0602 Shortness of breath: Secondary | ICD-10-CM | POA: Diagnosis present

## 2019-11-15 DIAGNOSIS — E875 Hyperkalemia: Secondary | ICD-10-CM | POA: Diagnosis not present

## 2019-11-15 DIAGNOSIS — I82409 Acute embolism and thrombosis of unspecified deep veins of unspecified lower extremity: Secondary | ICD-10-CM | POA: Diagnosis not present

## 2019-11-15 DIAGNOSIS — E669 Obesity, unspecified: Secondary | ICD-10-CM | POA: Diagnosis present

## 2019-11-15 DIAGNOSIS — I499 Cardiac arrhythmia, unspecified: Secondary | ICD-10-CM | POA: Diagnosis not present

## 2019-11-15 DIAGNOSIS — D638 Anemia in other chronic diseases classified elsewhere: Secondary | ICD-10-CM | POA: Diagnosis present

## 2019-11-15 DIAGNOSIS — I472 Ventricular tachycardia: Secondary | ICD-10-CM | POA: Diagnosis not present

## 2019-11-15 DIAGNOSIS — J9601 Acute respiratory failure with hypoxia: Secondary | ICD-10-CM | POA: Diagnosis present

## 2019-11-15 DIAGNOSIS — I429 Cardiomyopathy, unspecified: Secondary | ICD-10-CM | POA: Diagnosis present

## 2019-11-15 DIAGNOSIS — U071 COVID-19: Secondary | ICD-10-CM | POA: Diagnosis not present

## 2019-11-15 DIAGNOSIS — Z8616 Personal history of COVID-19: Secondary | ICD-10-CM

## 2019-11-15 DIAGNOSIS — I2510 Atherosclerotic heart disease of native coronary artery without angina pectoris: Secondary | ICD-10-CM | POA: Diagnosis present

## 2019-11-15 DIAGNOSIS — N179 Acute kidney failure, unspecified: Secondary | ICD-10-CM | POA: Diagnosis present

## 2019-11-15 DIAGNOSIS — I82431 Acute embolism and thrombosis of right popliteal vein: Secondary | ICD-10-CM | POA: Diagnosis present

## 2019-11-15 DIAGNOSIS — D500 Iron deficiency anemia secondary to blood loss (chronic): Secondary | ICD-10-CM | POA: Diagnosis not present

## 2019-11-15 DIAGNOSIS — K254 Chronic or unspecified gastric ulcer with hemorrhage: Secondary | ICD-10-CM | POA: Diagnosis not present

## 2019-11-15 DIAGNOSIS — K922 Gastrointestinal hemorrhage, unspecified: Secondary | ICD-10-CM | POA: Diagnosis not present

## 2019-11-15 DIAGNOSIS — I5021 Acute systolic (congestive) heart failure: Principal | ICD-10-CM | POA: Diagnosis present

## 2019-11-15 DIAGNOSIS — D509 Iron deficiency anemia, unspecified: Secondary | ICD-10-CM | POA: Diagnosis present

## 2019-11-15 DIAGNOSIS — D62 Acute posthemorrhagic anemia: Secondary | ICD-10-CM | POA: Diagnosis not present

## 2019-11-15 DIAGNOSIS — K761 Chronic passive congestion of liver: Secondary | ICD-10-CM | POA: Diagnosis present

## 2019-11-15 DIAGNOSIS — R5383 Other fatigue: Secondary | ICD-10-CM | POA: Diagnosis not present

## 2019-11-15 DIAGNOSIS — I4891 Unspecified atrial fibrillation: Secondary | ICD-10-CM | POA: Diagnosis not present

## 2019-11-15 DIAGNOSIS — I5082 Biventricular heart failure: Secondary | ICD-10-CM | POA: Diagnosis present

## 2019-11-15 DIAGNOSIS — N1831 Chronic kidney disease, stage 3a: Secondary | ICD-10-CM | POA: Diagnosis not present

## 2019-11-15 DIAGNOSIS — I4821 Permanent atrial fibrillation: Secondary | ICD-10-CM | POA: Diagnosis not present

## 2019-11-15 DIAGNOSIS — J849 Interstitial pulmonary disease, unspecified: Secondary | ICD-10-CM | POA: Diagnosis not present

## 2019-11-15 DIAGNOSIS — B9681 Helicobacter pylori [H. pylori] as the cause of diseases classified elsewhere: Secondary | ICD-10-CM | POA: Diagnosis present

## 2019-11-15 DIAGNOSIS — E1165 Type 2 diabetes mellitus with hyperglycemia: Secondary | ICD-10-CM | POA: Diagnosis present

## 2019-11-15 DIAGNOSIS — I50810 Right heart failure, unspecified: Secondary | ICD-10-CM | POA: Diagnosis not present

## 2019-11-15 DIAGNOSIS — R1312 Dysphagia, oropharyngeal phase: Secondary | ICD-10-CM | POA: Diagnosis present

## 2019-11-15 DIAGNOSIS — E1122 Type 2 diabetes mellitus with diabetic chronic kidney disease: Secondary | ICD-10-CM | POA: Diagnosis present

## 2019-11-15 DIAGNOSIS — Z85528 Personal history of other malignant neoplasm of kidney: Secondary | ICD-10-CM | POA: Diagnosis not present

## 2019-11-15 DIAGNOSIS — I9589 Other hypotension: Secondary | ICD-10-CM | POA: Diagnosis not present

## 2019-11-15 DIAGNOSIS — K920 Hematemesis: Secondary | ICD-10-CM | POA: Diagnosis not present

## 2019-11-15 DIAGNOSIS — K7689 Other specified diseases of liver: Secondary | ICD-10-CM | POA: Diagnosis present

## 2019-11-15 DIAGNOSIS — R7989 Other specified abnormal findings of blood chemistry: Secondary | ICD-10-CM | POA: Diagnosis not present

## 2019-11-15 DIAGNOSIS — Z87891 Personal history of nicotine dependence: Secondary | ICD-10-CM | POA: Diagnosis not present

## 2019-11-15 DIAGNOSIS — I340 Nonrheumatic mitral (valve) insufficiency: Secondary | ICD-10-CM | POA: Diagnosis not present

## 2019-11-15 DIAGNOSIS — K921 Melena: Secondary | ICD-10-CM | POA: Diagnosis not present

## 2019-11-15 DIAGNOSIS — N184 Chronic kidney disease, stage 4 (severe): Secondary | ICD-10-CM | POA: Diagnosis present

## 2019-11-15 DIAGNOSIS — E8809 Other disorders of plasma-protein metabolism, not elsewhere classified: Secondary | ICD-10-CM | POA: Diagnosis present

## 2019-11-15 DIAGNOSIS — R002 Palpitations: Secondary | ICD-10-CM | POA: Diagnosis not present

## 2019-11-15 DIAGNOSIS — I420 Dilated cardiomyopathy: Secondary | ICD-10-CM | POA: Diagnosis not present

## 2019-11-15 DIAGNOSIS — E871 Hypo-osmolality and hyponatremia: Secondary | ICD-10-CM | POA: Diagnosis present

## 2019-11-15 DIAGNOSIS — E1142 Type 2 diabetes mellitus with diabetic polyneuropathy: Secondary | ICD-10-CM | POA: Diagnosis present

## 2019-11-15 DIAGNOSIS — K259 Gastric ulcer, unspecified as acute or chronic, without hemorrhage or perforation: Secondary | ICD-10-CM | POA: Diagnosis not present

## 2019-11-15 DIAGNOSIS — I82401 Acute embolism and thrombosis of unspecified deep veins of right lower extremity: Secondary | ICD-10-CM | POA: Diagnosis not present

## 2019-11-15 DIAGNOSIS — R0600 Dyspnea, unspecified: Secondary | ICD-10-CM | POA: Diagnosis not present

## 2019-11-15 DIAGNOSIS — R040 Epistaxis: Secondary | ICD-10-CM | POA: Diagnosis not present

## 2019-11-15 DIAGNOSIS — E0842 Diabetes mellitus due to underlying condition with diabetic polyneuropathy: Secondary | ICD-10-CM | POA: Diagnosis not present

## 2019-11-15 DIAGNOSIS — J8410 Pulmonary fibrosis, unspecified: Secondary | ICD-10-CM | POA: Diagnosis present

## 2019-11-15 DIAGNOSIS — Z905 Acquired absence of kidney: Secondary | ICD-10-CM | POA: Diagnosis not present

## 2019-11-15 DIAGNOSIS — R931 Abnormal findings on diagnostic imaging of heart and coronary circulation: Secondary | ICD-10-CM | POA: Diagnosis not present

## 2019-11-15 LAB — CBC
HCT: 43.1 % (ref 39.0–52.0)
Hemoglobin: 14.5 g/dL (ref 13.0–17.0)
MCH: 31.3 pg (ref 26.0–34.0)
MCHC: 33.6 g/dL (ref 30.0–36.0)
MCV: 93.1 fL (ref 80.0–100.0)
Platelets: 163 10*3/uL (ref 150–400)
RBC: 4.63 MIL/uL (ref 4.22–5.81)
RDW: 14.1 % (ref 11.5–15.5)
WBC: 12 10*3/uL — ABNORMAL HIGH (ref 4.0–10.5)
nRBC: 0 % (ref 0.0–0.2)

## 2019-11-15 LAB — BASIC METABOLIC PANEL
Anion gap: 13 (ref 5–15)
BUN: 35 mg/dL — ABNORMAL HIGH (ref 8–23)
CO2: 16 mmol/L — ABNORMAL LOW (ref 22–32)
Calcium: 8.7 mg/dL — ABNORMAL LOW (ref 8.9–10.3)
Chloride: 104 mmol/L (ref 98–111)
Creatinine, Ser: 1.8 mg/dL — ABNORMAL HIGH (ref 0.61–1.24)
GFR calc Af Amer: 43 mL/min — ABNORMAL LOW (ref >60)
GFR calc non Af Amer: 37 mL/min — ABNORMAL LOW (ref >60)
Glucose, Bld: 184 mg/dL — ABNORMAL HIGH (ref 70–99)
Potassium: 4.7 mmol/L (ref 3.5–5.1)
Sodium: 133 mmol/L — ABNORMAL LOW (ref 135–145)

## 2019-11-15 LAB — RESPIRATORY PANEL BY PCR

## 2019-11-15 LAB — GLUCOSE, CAPILLARY
Glucose-Capillary: 140 mg/dL — ABNORMAL HIGH (ref 70–99)
Glucose-Capillary: 163 mg/dL — ABNORMAL HIGH (ref 70–99)

## 2019-11-15 LAB — HEPATIC FUNCTION PANEL
ALT: 61 U/L — ABNORMAL HIGH (ref 0–44)
AST: 29 U/L (ref 15–41)
Albumin: 2.5 g/dL — ABNORMAL LOW (ref 3.5–5.0)
Alkaline Phosphatase: 191 U/L — ABNORMAL HIGH (ref 38–126)
Bilirubin, Direct: 0.4 mg/dL — ABNORMAL HIGH (ref 0.0–0.2)
Indirect Bilirubin: 0.9 mg/dL (ref 0.3–0.9)
Total Bilirubin: 1.3 mg/dL — ABNORMAL HIGH (ref 0.3–1.2)
Total Protein: 5.8 g/dL — ABNORMAL LOW (ref 6.5–8.1)

## 2019-11-15 LAB — APTT: aPTT: 31 seconds (ref 24–36)

## 2019-11-15 LAB — MRSA PCR SCREENING: MRSA by PCR: NEGATIVE

## 2019-11-15 LAB — HEMOGLOBIN A1C
Hgb A1c MFr Bld: 7.3 % — ABNORMAL HIGH (ref 4.8–5.6)
Mean Plasma Glucose: 162.81 mg/dL

## 2019-11-15 LAB — LACTIC ACID, PLASMA: Lactic Acid, Venous: 1.9 mmol/L (ref 0.5–1.9)

## 2019-11-15 LAB — TROPONIN I (HIGH SENSITIVITY)
Troponin I (High Sensitivity): 38 ng/L — ABNORMAL HIGH (ref <18)
Troponin I (High Sensitivity): 39 ng/L — ABNORMAL HIGH (ref <18)

## 2019-11-15 LAB — TSH: TSH: 1.647 u[IU]/mL (ref 0.350–4.500)

## 2019-11-15 LAB — POC SARS CORONAVIRUS 2 AG -  ED: SARS Coronavirus 2 Ag: NEGATIVE

## 2019-11-15 LAB — PROTIME-INR
INR: 1.3 — ABNORMAL HIGH (ref 0.8–1.2)
Prothrombin Time: 16 seconds — ABNORMAL HIGH (ref 11.4–15.2)

## 2019-11-15 MED ORDER — METOPROLOL TARTRATE 25 MG PO TABS
25.0000 mg | ORAL_TABLET | Freq: Four times a day (QID) | ORAL | Status: DC
  Administered 2019-11-15 (×2): 25 mg via ORAL
  Filled 2019-11-15 (×3): qty 1

## 2019-11-15 MED ORDER — SODIUM CHLORIDE 0.9% FLUSH
3.0000 mL | Freq: Two times a day (BID) | INTRAVENOUS | Status: DC
  Administered 2019-11-15 – 2019-11-18 (×8): 3 mL via INTRAVENOUS

## 2019-11-15 MED ORDER — SAW PALMETTO 500 MG PO CAPS: 500.0000 mg | ORAL_CAPSULE | Freq: Every day | ORAL | Status: DC

## 2019-11-15 MED ORDER — TAMSULOSIN HCL 0.4 MG PO CAPS
0.4000 mg | ORAL_CAPSULE | Freq: Every day | ORAL | Status: DC
  Filled 2019-11-15: qty 1

## 2019-11-15 MED ORDER — HEPARIN BOLUS VIA INFUSION
5000.0000 [IU] | Freq: Once | INTRAVENOUS | Status: AC
  Administered 2019-11-15: 17:00:00 5000 [IU] via INTRAVENOUS
  Filled 2019-11-15: qty 5000

## 2019-11-15 MED ORDER — AMIODARONE HCL 200 MG PO TABS
200.0000 mg | ORAL_TABLET | Freq: Two times a day (BID) | ORAL | Status: DC
  Administered 2019-11-15 (×2): 200 mg via ORAL
  Filled 2019-11-15 (×3): qty 1

## 2019-11-15 MED ORDER — SODIUM CHLORIDE 0.9 % IV SOLN: 250.0000 mL | INTRAVENOUS | Status: DC | PRN

## 2019-11-15 MED ORDER — SODIUM CHLORIDE 0.9% FLUSH
3.0000 mL | Freq: Once | INTRAVENOUS | Status: AC
  Administered 2019-11-15: 3 mL via INTRAVENOUS

## 2019-11-15 MED ORDER — ATORVASTATIN CALCIUM 10 MG PO TABS: 10.0000 mg | ORAL_TABLET | Freq: Every day | ORAL | Status: DC

## 2019-11-15 MED ORDER — HEPARIN (PORCINE) 25000 UT/250ML-% IV SOLN
1600.0000 [IU]/h | INTRAVENOUS | Status: DC
  Administered 2019-11-15: 17:00:00 1400 [IU]/h via INTRAVENOUS
  Administered 2019-11-16: 1500 [IU]/h via INTRAVENOUS
  Administered 2019-11-16 – 2019-11-17 (×2): 1700 [IU]/h via INTRAVENOUS
  Administered 2019-11-18: 1600 [IU]/h via INTRAVENOUS
  Administered 2019-11-18: 1700 [IU]/h via INTRAVENOUS
  Filled 2019-11-15 (×6): qty 250

## 2019-11-15 MED ORDER — TECHNETIUM TC 99M DIETHYLENETRIAME-PENTAACETIC ACID
39.0000 | Freq: Once | INTRAVENOUS | Status: AC | PRN
  Administered 2019-11-15: 18:00:00 39 via INTRAVENOUS

## 2019-11-15 MED ORDER — DILTIAZEM HCL-DEXTROSE 125-5 MG/125ML-% IV SOLN (PREMIX)
5.0000 mg/h | INTRAVENOUS | Status: DC
  Administered 2019-11-15: 7.5 mg/h via INTRAVENOUS
  Administered 2019-11-15: 14:00:00 5 mg/h via INTRAVENOUS
  Filled 2019-11-15 (×2): qty 125

## 2019-11-15 MED ORDER — OMEGA-3-ACID ETHYL ESTERS 1 G PO CAPS
1.0000 g | ORAL_CAPSULE | Freq: Every day | ORAL | Status: DC
  Administered 2019-11-15 – 2019-11-26 (×11): 1 g via ORAL
  Filled 2019-11-15 (×11): qty 1

## 2019-11-15 MED ORDER — SODIUM CHLORIDE 0.9 % IV BOLUS
500.0000 mL | Freq: Once | INTRAVENOUS | Status: AC
  Administered 2019-11-15: 14:00:00 500 mL via INTRAVENOUS

## 2019-11-15 MED ORDER — DILTIAZEM HCL 25 MG/5ML IV SOLN
15.0000 mg | Freq: Once | INTRAVENOUS | Status: AC
  Administered 2019-11-15: 15 mg via INTRAVENOUS
  Filled 2019-11-15: qty 5

## 2019-11-15 MED ORDER — ADULT MULTIVITAMIN W/MINERALS CH
1.0000 | ORAL_TABLET | Freq: Every day | ORAL | Status: DC
  Administered 2019-11-16 – 2019-11-26 (×9): 1 via ORAL
  Filled 2019-11-15 (×10): qty 1

## 2019-11-15 MED ORDER — TECHNETIUM TO 99M ALBUMIN AGGREGATED
1.6000 | Freq: Once | INTRAVENOUS | Status: AC | PRN
  Administered 2019-11-15: 18:00:00 1.6 via INTRAVENOUS

## 2019-11-15 MED ORDER — ATORVASTATIN CALCIUM 10 MG PO TABS
10.0000 mg | ORAL_TABLET | Freq: Every day | ORAL | Status: DC
  Administered 2019-11-16 – 2019-11-25 (×10): 10 mg via ORAL
  Filled 2019-11-15 (×11): qty 1

## 2019-11-15 MED ORDER — DIGOXIN 0.25 MG/ML IJ SOLN
0.2500 mg | Freq: Every day | INTRAMUSCULAR | Status: AC
  Administered 2019-11-15 – 2019-11-16 (×2): 0.25 mg via INTRAVENOUS
  Filled 2019-11-15 (×2): qty 2

## 2019-11-15 MED ORDER — SODIUM CHLORIDE 0.9 % IV BOLUS
500.0000 mL | Freq: Once | INTRAVENOUS | Status: AC
  Administered 2019-11-15: 12:00:00 500 mL via INTRAVENOUS

## 2019-11-15 MED ORDER — ASCORBIC ACID 500 MG PO TABS
250.0000 mg | ORAL_TABLET | Freq: Every day | ORAL | Status: DC
  Administered 2019-11-15 – 2019-11-26 (×11): 250 mg via ORAL
  Filled 2019-11-15 (×11): qty 1

## 2019-11-15 MED ORDER — SODIUM CHLORIDE 0.9% FLUSH: 3.0000 mL | INTRAVENOUS | Status: DC | PRN

## 2019-11-15 MED ORDER — INSULIN ASPART 100 UNIT/ML ~~LOC~~ SOLN
0.0000 [IU] | Freq: Three times a day (TID) | SUBCUTANEOUS | Status: DC
  Administered 2019-11-15: 2 [IU] via SUBCUTANEOUS
  Administered 2019-11-17 (×2): 3 [IU] via SUBCUTANEOUS
  Administered 2019-11-18 – 2019-11-19 (×4): 2 [IU] via SUBCUTANEOUS
  Administered 2019-11-20: 3 [IU] via SUBCUTANEOUS
  Administered 2019-11-21 (×2): 2 [IU] via SUBCUTANEOUS
  Administered 2019-11-22: 12:00:00 3 [IU] via SUBCUTANEOUS
  Administered 2019-11-23 – 2019-11-24 (×2): 2 [IU] via SUBCUTANEOUS
  Administered 2019-11-24 – 2019-11-25 (×2): 3 [IU] via SUBCUTANEOUS
  Administered 2019-11-25 – 2019-11-26 (×2): 2 [IU] via SUBCUTANEOUS

## 2019-11-15 NOTE — ED Triage Notes (Signed)
Pt went to [facility] today for cough and sore throat since Wednesday.  Covid + on 1/2.  Sent to ED due to elevated HR.  Pt denies chest pain.

## 2019-11-15 NOTE — ED Notes (Signed)
Attempted to call nursing report.  

## 2019-11-15 NOTE — [place] Notes (Signed)
ANTICOAGULATION CONSULT NOTE - Initial Consult  Pharmacy Consult for heparin Indication: atrial fibrillation  Allergies  Allergen Reactions  . Novocain [Procaine] Other (See Comments)    Syncope   . Other     Other reaction(s): passes out    Patient Measurements: Weight: 240 lb (108.9 kg) Heparin Dosing Weight: 104kg  Vital Signs: Temp: 97.3 F (36.3 C) (02/07 1057) Temp Source: Oral (02/07 1057) BP: 111/79 (02/07 1622) Pulse Rate: 116 (02/07 1622)  Labs: Recent Labs    11/15/19 1118 11/15/19 1321  HGB 14.5  --   HCT 43.1  --   PLT 163  --   CREATININE 1.80*  --   TROPONINIHS 38* 39*    Estimated Creatinine Clearance: 48.7 mL/min (A) (by C-G formula based on SCr of 1.8 mg/dL (H)).   Medical History: Past Medical History:  Diagnosis Date  . Diabetes mellitus ([place])   . History of kidney cancer    Assessment: 56 YOM presenting with cough, palpitations, now with AFib w/RVR.  Not on anticoagulation PTA, CBC wnl.    Goal of Therapy:  Heparin level 0.3-0.7 units/ml Monitor platelets by anticoagulation protocol: Yes   Plan:  Heparin 5000 units IV x1, and gtt at 1400 units/hr F/u 8 hour heparin level  Bertis Ruddy, PharmD Clinical Pharmacist Please check AMION for all [facility] [place] numbers 11/15/2019 4:34 PM

## 2019-11-15 NOTE — ED Provider Notes (Signed)
[facility] EMERGENCY DEPARTMENT Provider Note   CSN: [id number] Arrival date & time: 11/15/19  1043     History Chief Complaint  Patient presents with  . Tachycardia  . Cough    Melvin Ramirez is a 74 y.o. male.  Melvin Ramirez is a 74 y.o. male with history of diabetes and kidney cancer s/p nephrectomy, who presents to the emergency department from [facility] physicians clinic today for evaluation of cough and sore throat since Wednesday, when patient arrived in clinic he was found to be tachycardic to the 160s and was brought to the ED for further evaluation.  No prior history of the same, no known history of A. fib.  Patient reports that he has been feeling increasingly short of breath but does not feel palpitations or heart racing sensation.  Patient was diagnosed with Covid on 1/2 and had fairly mild symptoms, was found to have Covid pneumonia but was never hospitalized, was cleared from quarantine and seem to be improving until 4 days ago he developed a cough and sore throat again, no fevers or chills.  Over the past 3 days patient has developed increasing shortness of breath, called his PCP yesterday who recommended that he come into the clinic this morning for reevaluation.  Patient denies having any chest pain.  Reports shortness of breath is present and worse with activity.  Denies pleuritic pain.  No lightheadedness or syncope.  No associated abdominal pain, nausea, vomiting or diarrhea.  Patient denies history of heart problems has never seen a cardiologist or been diagnosed with any arrhythmia that he knows of.  He is not on any blood thinners.  No other aggravating or alleviating factors.  No new sick contacts noted.        Past Medical History:  Diagnosis Date  . Diabetes mellitus ([place])   . History of kidney cancer     Patient Active Problem List   Diagnosis Date Noted  . Atrial fibrillation with RVR ([place]) 11/15/2019  . Diabetic polyneuropathy associated  with diabetes mellitus due to underlying condition ([place]) 07/05/2016    Past Surgical History:  Procedure Laterality Date  . NEPHRECTOMY         Family History  Problem Relation Age of Onset  . Cancer Mother   . Heart attack Father   . Heart attack Brother     Social History   Tobacco Use  . Smoking status: Former Smoker    Packs/day: 2.00    Years: 35.00    Pack years: 70.00    Types: Cigarettes    Quit date: 2000    Years since quitting: 21.1  . Smokeless tobacco: Never Used  Substance Use Topics  . Alcohol use: Yes    Comment: Socially  . Drug use: No    Home Medications Prior to Admission medications   Medication Sig Start Date End Date Taking? Authorizing Provider  Ascorbic Acid (VITAMIN C) 100 MG tablet as directed   Yes [provider]  atorvastatin (LIPITOR) 10 MG tablet Take 10 mg by mouth daily. 10/27/19  Yes [provider]  Garlic 062 MG CAPS as directed   Yes [provider]  Glucosamine 500 MG CAPS as directed   Yes [provider]  Multiple Vitamin (MULTIVITAMIN) capsule as directed   Yes [provider]  Omega-3 Fatty Acids (FISH OIL) 1000 MG CAPS Take 1,000 mg by mouth daily.    Yes [provider]  Saw Palmetto 500 MG CAPS  as directed   Yes [provider]  tamsulosin (FLOMAX) 0.4 MG CAPS capsule Take 0.4 mg by mouth daily. 10/23/19  Yes [provider]    Allergies    Novocain [procaine] and Other  Review of Systems   Review of Systems  Constitutional: Negative for chills and fever.  HENT: Positive for sore throat. Negative for congestion, ear pain and rhinorrhea.   Respiratory: Positive for cough and shortness of breath.   Cardiovascular: Negative for chest pain, palpitations and leg swelling.  Gastrointestinal: Negative for abdominal pain, nausea and vomiting.  Genitourinary: Negative for dysuria and frequency.  Musculoskeletal: Negative for arthralgias and myalgias.    Skin: Negative for color change and rash.  Neurological: Negative for dizziness, syncope and light-headedness.  All other systems reviewed and are negative.   Physical Exam Updated Vital Signs BP (!) 110/98 (BP Location: Right Arm)   Pulse (!) 164   Temp (!) 97.3 F (36.3 C) (Oral)   Resp 16   SpO2 97%   Physical Exam Vitals and nursing note reviewed.  Constitutional:      General: He is not in acute distress.    Appearance: Normal appearance. He is well-developed and normal weight. He is not diaphoretic.  HENT:     Head: Normocephalic and atraumatic.     Nose: Nose normal.     Mouth/Throat:     Mouth: Mucous membranes are moist.     Pharynx: Oropharynx is clear.     Comments: Posterior oropharynx is mildly erythematous but with no edema or exudates, uvula midline Eyes:     General:        Right eye: No discharge.        Left eye: No discharge.  Cardiovascular:     Rate and Rhythm: Tachycardia present. Rhythm irregular.     Pulses: Normal pulses.     Heart sounds: Normal heart sounds. No murmur. No friction rub. No gallop.      Comments: Tachycardia to the 160s with irregularly irregular rhythm Pulmonary:     Effort: Pulmonary effort is normal. No respiratory distress.     Breath sounds: Rhonchi present. No wheezing or rales.     Comments: Respirations equal and unlabored, patient able to speak in full sentences, satting well on room air, patient with a few scattered faint rhonchi noted in bilateral lung fields Abdominal:     General: Bowel sounds are normal. There is no distension.     Palpations: Abdomen is soft. There is no mass.     Tenderness: There is no abdominal tenderness. There is no guarding.     Comments: Abdomen soft, nondistended, nontender to palpation in all quadrants without guarding or peritoneal signs  Musculoskeletal:        General: No deformity.     Cervical back: Neck supple.  Lymphadenopathy:     Cervical: No cervical adenopathy.  Skin:     General: Skin is warm and dry.     Capillary Refill: Capillary refill takes less than 2 seconds.  Neurological:     Mental Status: He is alert.     Coordination: Coordination normal.     Comments: Speech is clear, able to follow commands Moves extremities without ataxia, coordination intact  Psychiatric:        Mood and Affect: Mood normal.        Behavior: Behavior normal.     ED Results / Procedures / Treatments   Labs (all labs ordered are listed, but only abnormal  results are displayed) Labs Reviewed  BASIC METABOLIC PANEL - Abnormal; Notable for the following components:      Result Value   Sodium 133 (*)    CO2 16 (*)    Glucose, Bld 184 (*)    BUN 35 (*)    Creatinine, Ser 1.80 (*)    Calcium 8.7 (*)    GFR calc non Af Amer 37 (*)    GFR calc Af Amer 43 (*)    All other components within normal limits  CBC - Abnormal; Notable for the following components:   WBC 12.0 (*)    All other components within normal limits  HEPATIC FUNCTION PANEL - Abnormal; Notable for the following components:   Total Protein 5.8 (*)    Albumin 2.5 (*)    ALT 61 (*)    Alkaline Phosphatase 191 (*)    Total Bilirubin 1.3 (*)    Bilirubin, Direct 0.4 (*)    All other components within normal limits  PROTIME-INR - Abnormal; Notable for the following components:   Prothrombin Time 16.0 (*)    INR 1.3 (*)    All other components within normal limits  HEMOGLOBIN A1C - Abnormal; Notable for the following components:   Hgb A1c MFr Bld 7.3 (*)    All other components within normal limits  CBC WITH DIFFERENTIAL/PLATELET - Abnormal; Notable for the following components:   WBC 13.2 (*)    Platelets 103 (*)    Neutro Abs 11.0 (*)    Abs Immature Granulocytes 0.37 (*)    All other components within normal limits  BASIC METABOLIC PANEL - Abnormal; Notable for the following components:   Potassium 5.6 (*)    CO2 17 (*)    Glucose, Bld 123 (*)    BUN 44 (*)    Creatinine, Ser 2.75 (*)     Calcium 8.7 (*)    GFR calc non Af Amer 22 (*)    GFR calc Af Amer 26 (*)    Anion gap 21 (*)    All other components within normal limits  GLUCOSE, CAPILLARY - Abnormal; Notable for the following components:   Glucose-Capillary 140 (*)    All other components within normal limits  GLUCOSE, CAPILLARY - Abnormal; Notable for the following components:   Glucose-Capillary 163 (*)    All other components within normal limits  HEPARIN LEVEL (UNFRACTIONATED) - Abnormal; Notable for the following components:   Heparin Unfractionated <0.10 (*)    All other components within normal limits  GLUCOSE, CAPILLARY - Abnormal; Notable for the following components:   Glucose-Capillary 120 (*)    All other components within normal limits  TROPONIN I (HIGH SENSITIVITY) - Abnormal; Notable for the following components:   Troponin I (High Sensitivity) 38 (*)    All other components within normal limits  TROPONIN I (HIGH SENSITIVITY) - Abnormal; Notable for the following components:   Troponin I (High Sensitivity) 39 (*)    All other components within normal limits  RESPIRATORY PANEL BY PCR  MRSA PCR SCREENING  LACTIC ACID, PLASMA  TSH  APTT  URINALYSIS, ROUTINE W REFLEX MICROSCOPIC  CBC  HEPARIN LEVEL (UNFRACTIONATED)  POC SARS CORONAVIRUS 2 AG -  ED    EKG None  Radiology DG Chest Port 1 View  Result Date: 11/15/2019 CLINICAL DATA:  74 year old male with a history of shortness of breath EXAM: PORTABLE CHEST 1 VIEW COMPARISON:  10/26/2019, CT chest 10/26/2019 FINDINGS: Cardiomediastinal silhouette unchanged in size and contour. No pneumothorax.  Similar appearance of reticulonodular opacities throughout the lungs with no significant change to the x-ray 10/26/2019. No large pleural effusion. No displaced fracture IMPRESSION: Similar appearance of bilateral reticulonodular opacities to the comparison chest x-rays, compatible with COVID pneumonia. Electronically Signed   By: Corrie Mckusick D.O.   On:  11/15/2019 12:48    Procedures .Critical Care Performed by: Jacqlyn Larsen, PA-C Authorized by: Jacqlyn Larsen, PA-C   Critical care provider statement:    Critical care time (minutes):  45   Critical care was necessary to treat or prevent imminent or life-threatening deterioration of the following conditions:  Cardiac failure (New onset A. fib with RVR)   Critical care was time spent personally by me on the following activities:  Discussions with consultants, evaluation of patient's response to treatment, examination of patient, ordering and performing treatments and interventions, ordering and review of laboratory studies, ordering and review of radiographic studies, pulse oximetry, re-evaluation of patient's condition, obtaining history from patient or surrogate and review of old charts   (including critical care time)  Medications Ordered in ED Medications  diltiazem (CARDIZEM) 125 mg in dextrose 5% 125 mL (1 mg/mL) infusion (10 mg/hr Intravenous Rate/Dose Change 11/15/19 1908)  digoxin (LANOXIN) 0.25 MG/ML injection 0.25 mg (0.25 mg Intravenous Given 11/15/19 1512)  sodium chloride flush (NS) 0.9 % injection 3 mL (3 mLs Intravenous Given 11/15/19 2139)  sodium chloride flush (NS) 0.9 % injection 3 mL (has no administration in time range)  0.9 %  sodium chloride infusion (has no administration in time range)  ascorbic acid (VITAMIN C) tablet 250 mg (250 mg Oral Given 11/15/19 1909)  multivitamin with minerals tablet 1 tablet (has no administration in time range)  omega-3 acid ethyl esters (LOVAZA) capsule 1 g (1 g Oral Given 11/15/19 1910)  tamsulosin (FLOMAX) capsule 0.4 mg (has no administration in time range)  atorvastatin (LIPITOR) tablet 10 mg (has no administration in time range)  insulin aspart (novoLOG) injection 0-15 Units (0 Units Subcutaneous Not Given 11/16/19 0718)  metoprolol tartrate (LOPRESSOR) tablet 25 mg (25 mg Oral Not Given 11/15/19 2141)  amiodarone (PACERONE) tablet 200 mg  (200 mg Oral Given 11/15/19 2128)  heparin ADULT infusion 100 units/mL (25000 units/210mL sodium chloride 0.45%) (1,500 Units/hr Intravenous New Bag/Given 11/16/19 0747)  phenol (CHLORASEPTIC) mouth spray 1 spray (has no administration in time range)  sodium chloride flush (NS) 0.9 % injection 3 mL (3 mLs Intravenous Given 11/15/19 1200)  sodium chloride 0.9 % bolus 500 mL (0 mLs Intravenous Stopped 11/15/19 1350)  diltiazem (CARDIZEM) injection 15 mg (15 mg Intravenous Given by Other 11/15/19 1209)  sodium chloride 0.9 % bolus 500 mL (0 mLs Intravenous Stopping Infusion hung by another clincian 11/16/19 0718)  heparin bolus via infusion 5,000 Units (5,000 Units Intravenous Bolus from Bag 11/15/19 1724)  technetium TC 2M diethylenetriame-pentaacetic acid (DTPA) injection 39 millicurie (39 millicuries Intravenous Given 11/15/19 1827)  technetium albumin aggregated (MAA) injection solution 1.6 millicurie (1.6 millicuries Intravenous Contrast Given 11/15/19 1826)  sodium chloride 0.9 % bolus 1,000 mL (0 mLs Intravenous Stopping Infusion hung by another clincian 11/16/19 0962)    ED Course  I have reviewed the triage vital signs and the nursing notes.  Pertinent labs & imaging results that were available during my care of the patient were reviewed by me and considered in my medical decision making (see chart for details).    MDM Rules/Calculators/A&P  74 year old male presents from PCP office with heart rate of 160, on initial EKG due to rapid rate it is difficult to tell the rhythm, but suspect A. fib.  No prior history.  Patient reports he had Covid at the beginning of January and symptoms improved but then he began experiencing a cough and sore throat 3 to 4 days ago with worsening dyspnea, primarily with exertion, denies chest pain or palpitations.  Despite heart rate of 160, aside from shortness of breath patient is not symptomatic and he is in no respiratory distress satting well on room air  with a few rhonchi noted throughout bilateral lung fields.  He has no lower extremity edema.  Basic labs ordered as well as lactic acid, troponin, and chest x-ray.  Concern for potential PE given recent Covid infection, tachycardia and worsening shortness of breath.  I suspect D-dimer will still be elevated, would like to proceed with CTA.  Will give bolus dose of diltiazem, hopefully this will slow down rate enough that we will be able to determine rhythm.  After bolus dose of diltiazem rate slowed to the 110s, and it appears patient is in A. fib with intermittent PVCs.  Patient's blood pressure dropped some with bolus dose of diltiazem, 500 cc fluid bolus given.  Lab work shows mild leukocytosis of 12.0, normal hemoglobin.  Patient's creatinine is elevated at 1.80 with a GFR of 37, he is not a good candidate for CTA but I am still concerned for PE, will order VQ scan.  No other electrolyte derangements that warrant immediate intervention.  Troponins mildly elevated at 38.  Chest x-ray still shows evidence of Covid pneumonia.  Given new onset A. fib with RVR, after initial dose of diltiazem patient immediately went back to heart rates in the 140s.  Will start patient on diltiazem drip, IV fluids given as well.  Will discuss with cardiology.  Case discussed with Dr. Doylene Canard with cardiology who will see and admit the patient for new onset A. fib with RVR, agrees with IV diltiazem drip and VQ study.  Heart rate improving into the 110s on diltiazem and blood pressure remaining stable.  No previous echo available for heart function  Melvin Ramirez was evaluated in Emergency Department on 11/16/2019 for the symptoms described in the history of present illness. He was evaluated in the context of the global COVID-19 pandemic, which necessitated consideration that the patient might be at risk for infection with the SARS-CoV-2 virus that causes COVID-19. Institutional protocols and algorithms that pertain to the  evaluation of patients at risk for COVID-19 are in a state of rapid change based on information released by regulatory bodies including the CDC and federal and state organizations. These policies and algorithms were followed during the patient's care in the ED.  Final Clinical Impression(s) / ED Diagnoses Final diagnoses:  Atrial fibrillation with RVR [facility])    Rx / DC Orders ED Discharge Orders    None       Janet Berlin 11/16/19 2633    Wyvonnia Dusky, MD 11/16/19 0930

## 2019-11-15 NOTE — Plan of Care (Signed)
Initiating care Plan Problem: Education: Goal: Knowledge of General Education information will improve Description: Including pain rating scale, medication(s)/side effects and non-pharmacologic comfort measures Outcome: Progressing   Problem: Health Behavior/Discharge Planning: Goal: Ability to manage health-related needs will improve Outcome: Progressing   Problem: Clinical Measurements: Goal: Ability to maintain clinical measurements within normal limits will improve Outcome: Progressing Goal: Will remain free from infection Outcome: Progressing Goal: Diagnostic test results will improve Outcome: Progressing Goal: Respiratory complications will improve Outcome: Progressing Goal: Cardiovascular complication will be avoided Outcome: Progressing   Problem: Activity: Goal: Risk for activity intolerance will decrease Outcome: Progressing   Problem: Nutrition: Goal: Adequate nutrition will be maintained Outcome: Progressing   Problem: Coping: Goal: Level of anxiety will decrease Outcome: Progressing   Problem: Elimination: Goal: Will not experience complications related to bowel motility Outcome: Progressing Goal: Will not experience complications related to urinary retention Outcome: Progressing   Problem: Pain Managment: Goal: General experience of comfort will improve Outcome: Progressing   Problem: Safety: Goal: Ability to remain free from injury will improve Outcome: Progressing   Problem: Skin Integrity: Goal: Risk for impaired skin integrity will decrease Outcome: Progressing   Problem: Respiratory: Goal: Will maintain a patent airway Outcome: Progressing Goal: Complications related to the disease process, condition or treatment will be avoided or minimized Outcome: Progressing

## 2019-11-15 NOTE — Progress Notes (Signed)
Cardizem titrated down to 5mg /hr from 10mg /hr

## 2019-11-15 NOTE — H&P (Signed)
Referring Physician: Dakarai Mcglocklin is an 74 y.o. male.                       Chief Complaint: Palpitations and cough  HPI: 74 years old white male with COVID-19 pneumonia 1 month ago has recurrence of cough and palpitations. He has h/o type 2 DM and left nephrectomy for kidney cancer. His EKG shows atrial fibrillation with RVR. His CXR is positive for bilateral pneumonia consistent with COVD pneumonia. His blood sugar is 184 mg; creatinine is 1.80, Troponin I is 38 ng. His LFT shows albumin level of 2.5 g, mildly elevated ALT and alkaline phosphatase.  Past Medical History:  Diagnosis Date  . Diabetes mellitus ([place])   . History of kidney cancer       Past Surgical History:  Procedure Laterality Date  . NEPHRECTOMY      Family History  Problem Relation Age of Onset  . Cancer Mother   . Heart attack Father   . Heart attack Brother    Social History:  reports that he quit smoking about 21 years ago. His smoking use included cigarettes. He has a 70.00 pack-year smoking history. He has never used smokeless tobacco. He reports current alcohol use. He reports that he does not use drugs.  Allergies:  Allergies  Allergen Reactions  . Novocain [Procaine] Other (See Comments)    Syncope   . Other     Other reaction(s): passes out    (Not in a hospital admission)   Results for orders placed or performed during the hospital encounter of 11/15/19 (from the past 48 hour(s))  Basic metabolic panel     Status: Abnormal   Collection Time: 11/15/19 11:18 AM  Result Value Ref Range   Sodium 133 (L) 135 - 145 mmol/L   Potassium 4.7 3.5 - 5.1 mmol/L   Chloride 104 98 - 111 mmol/L   CO2 16 (L) 22 - 32 mmol/L   Glucose, Bld 184 (H) 70 - 99 mg/dL   BUN 35 (H) 8 - 23 mg/dL   Creatinine, Ser 1.80 (H) 0.61 - 1.24 mg/dL   Calcium 8.7 (L) 8.9 - 10.3 mg/dL   GFR calc non Af Amer 37 (L) >60 mL/min   GFR calc Af Amer 43 (L) >60 mL/min   Anion gap 13 5 - 15    Comment: Performed at [facility], [address], [place], [place] [address]  CBC     Status: Abnormal   Collection Time: 11/15/19 11:18 AM  Result Value Ref Range   WBC 12.0 (H) 4.0 - 10.5 K/uL   RBC 4.63 4.22 - 5.81 MIL/uL   Hemoglobin 14.5 13.0 - 17.0 g/dL   HCT 43.1 39.0 - 52.0 %   MCV 93.1 80.0 - 100.0 fL   MCH 31.3 26.0 - 34.0 pg   MCHC 33.6 30.0 - 36.0 g/dL   RDW 14.1 11.5 - 15.5 %   Platelets 163 150 - 400 K/uL   nRBC 0.0 0.0 - 0.2 %    Comment: Performed at [facility], [place] [address] [place], [place] [address]  Troponin I (High Sensitivity)     Status: Abnormal   Collection Time: 11/15/19 11:18 AM  Result Value Ref Range   Troponin I (High Sensitivity) 38 (H) <18 ng/L    Comment: (NOTE) Elevated high sensitivity troponin I (hsTnI) values and significant  changes across serial measurements may suggest ACS but many  other  chronic and acute conditions are known to elevate hsTnI results.  Refer to the "Links" section for chest pain algorithms and additional  guidance. Performed at [facility], [place] [address], [place], [place] [address]   Hepatic function panel     Status: Abnormal   Collection Time: 11/15/19  1:21 PM  Result Value Ref Range   Total Protein 5.8 (L) 6.5 - 8.1 g/dL   Albumin 2.5 (L) 3.5 - 5.0 g/dL   AST 29 15 - 41 U/L   ALT 61 (H) 0 - 44 U/L   Alkaline Phosphatase 191 (H) 38 - 126 U/L   Total Bilirubin 1.3 (H) 0.3 - 1.2 mg/dL   Bilirubin, Direct 0.4 (H) 0.0 - 0.2 mg/dL   Indirect Bilirubin 0.9 0.3 - 0.9 mg/dL    Comment: Performed at [facility] [address], [place], [place] [address]  Troponin I (High Sensitivity)     Status: Abnormal   Collection Time: 11/15/19  1:21 PM  Result Value Ref Range   Troponin I (High Sensitivity) 39 (H) <18 ng/L    Comment: (NOTE) Elevated high sensitivity troponin I (hsTnI) values and significant  changes across serial measurements may suggest ACS but many other  chronic and acute conditions are known to elevate  hsTnI results.  Refer to the "Links" section for chest pain algorithms and additional  guidance. Performed at [facility], [place] [address], [place], [place] [address]   Lactic acid, plasma     Status: None   Collection Time: 11/15/19  1:22 PM  Result Value Ref Range   Lactic Acid, Venous 1.9 0.5 - 1.9 mmol/L    Comment: Performed at [facility] [address], [place], [place] [address]   DG Chest Port 1 View  Result Date: 11/15/2019 CLINICAL DATA:  74 year old male with a history of shortness of breath EXAM: PORTABLE CHEST 1 VIEW COMPARISON:  10/26/2019, CT chest 10/26/2019 FINDINGS: Cardiomediastinal silhouette unchanged in size and contour. No pneumothorax. Similar appearance of reticulonodular opacities throughout the lungs with no significant change to the x-ray 10/26/2019. No large pleural effusion. No displaced fracture IMPRESSION: Similar appearance of bilateral reticulonodular opacities to the comparison chest x-rays, compatible with COVID pneumonia. Electronically Signed   By: Corrie Mckusick D.O.   On: 11/15/2019 12:48    Review Of Systems Constitutional: No fever, chills, weight loss or gain. Eyes: No vision change, wears glasses. No discharge or pain. Ears: No hearing loss, No tinnitus. Respiratory: No asthma, COPD, positive pneumonias, shortness of breath. No hemoptysis. Cardiovascular: No chest pain, positive palpitation, leg edema. Gastrointestinal: No nausea, vomiting, diarrhea, constipation. No GI bleed. No hepatitis. Genitourinary: No dysuria, hematuria, kidney stone. No incontinance. Neurological: No headache, stroke, seizures.  Psychiatry: No psych facility admission for anxiety, depression, suicide. No detox. Skin: No rash. Musculoskeletal: Positive joint pain, no fibromyalgia. No neck pain, back pain. Lymphadenopathy: No lymphadenopathy. Hematology: No anemia or easy bruising.   Blood pressure 93/73, pulse 69, temperature (!) 97.3 F (36.3 C),  temperature source Oral, resp. rate (!) 33, SpO2 90 %. There is no height or weight on file to calculate BMI. General appearance: alert, cooperative, appears stated age and moderate respiratory distress Head: Normocephalic, atraumatic. Eyes: Blue eyes, pink conjunctiva, corneas clear. PERRL, EOM's intact. Neck: No adenopathy, no carotid bruit, no JVD, supple, symmetrical, trachea midline and thyroid not enlarged. Resp: basal crackles to auscultation bilaterally. Cardio: Rapid, irregular rate and rhythm, S1, S2 normal, II/VI  systolic murmur, no click, rub or gallop GI: Soft, non-tender; bowel sounds normal; no organomegaly. Extremities: 1 + lower leg edema, cyanosis or clubbing. Skin: Warm and dry.  Neurologic: Alert and oriented X 3, normal strength. Normal coordination.  Assessment/Plan Atrial fibrillation with RVR Recent COVID pneumonia Type 2 DM with hyperglycemia CKD, III S/P Left nephrectomy Obesity  Admit IV heparin per pharmacy. IV diltiazem. IV lanoxin for rate control as BP is low. Small dose B-blocker or amiodarone as tolerated and needed. Home medications.  Time spent: Review of old records, Lab, x-rays, EKG, other cardiac tests, examination, discussion with patient over 70 minutes.  Birdie Riddle, MD  11/15/2019, 3:05 PM

## 2019-11-16 ENCOUNTER — Inpatient Hospital Stay (HOSPITAL_COMMUNITY): Payer: Commercial Managed Care - PPO

## 2019-11-16 LAB — CBC WITH DIFFERENTIAL/PLATELET
Abs Immature Granulocytes: 0.37 10*3/uL — ABNORMAL HIGH (ref 0.00–0.07)
Basophils Absolute: 0.1 10*3/uL (ref 0.0–0.1)
Basophils Relative: 0 %
Eosinophils Absolute: 0 10*3/uL (ref 0.0–0.5)
Eosinophils Relative: 0 %
HCT: 42.2 % (ref 39.0–52.0)
Hemoglobin: 13.9 g/dL (ref 13.0–17.0)
Immature Granulocytes: 3 %
Lymphocytes Relative: 7 %
Lymphs Abs: 0.9 10*3/uL (ref 0.7–4.0)
MCH: 31.1 pg (ref 26.0–34.0)
MCHC: 32.9 g/dL (ref 30.0–36.0)
MCV: 94.4 fL (ref 80.0–100.0)
Monocytes Absolute: 0.8 10*3/uL (ref 0.1–1.0)
Monocytes Relative: 6 %
Neutro Abs: 11 10*3/uL — ABNORMAL HIGH (ref 1.7–7.7)
Neutrophils Relative %: 84 %
Platelets: 103 10*3/uL — ABNORMAL LOW (ref 150–400)
RBC: 4.47 MIL/uL (ref 4.22–5.81)
RDW: 14.2 % (ref 11.5–15.5)
WBC: 13.2 10*3/uL — ABNORMAL HIGH (ref 4.0–10.5)
nRBC: 0.2 % (ref 0.0–0.2)

## 2019-11-16 LAB — GLUCOSE, CAPILLARY
Glucose-Capillary: 120 mg/dL — ABNORMAL HIGH (ref 70–99)
Glucose-Capillary: 93 mg/dL (ref 70–99)
Glucose-Capillary: 96 mg/dL (ref 70–99)
Glucose-Capillary: 143 mg/dL — ABNORMAL HIGH (ref 70–99)

## 2019-11-16 LAB — BASIC METABOLIC PANEL
Anion gap: 21 — ABNORMAL HIGH (ref 5–15)
BUN: 44 mg/dL — ABNORMAL HIGH (ref 8–23)
CO2: 17 mmol/L — ABNORMAL LOW (ref 22–32)
Calcium: 8.7 mg/dL — ABNORMAL LOW (ref 8.9–10.3)
Chloride: 98 mmol/L (ref 98–111)
Creatinine, Ser: 2.75 mg/dL — ABNORMAL HIGH (ref 0.61–1.24)
GFR calc Af Amer: 26 mL/min — ABNORMAL LOW (ref >60)
GFR calc non Af Amer: 22 mL/min — ABNORMAL LOW (ref >60)
Glucose, Bld: 123 mg/dL — ABNORMAL HIGH (ref 70–99)
Potassium: 5.6 mmol/L — ABNORMAL HIGH (ref 3.5–5.1)
Sodium: 136 mmol/L (ref 135–145)

## 2019-11-16 LAB — HEPARIN LEVEL (UNFRACTIONATED)
Heparin Unfractionated: <0.1 IU/mL — ABNORMAL LOW (ref 0.30–0.70)
Heparin Unfractionated: 0.24 IU/mL — ABNORMAL LOW (ref 0.30–0.70)
Heparin Unfractionated: 0.47 IU/mL (ref 0.30–0.70)

## 2019-11-16 LAB — ECHOCARDIOGRAM COMPLETE
Height: 73 in
Weight: 3788.38 oz

## 2019-11-16 MED ORDER — SODIUM CHLORIDE 0.9 % IV BOLUS
500.0000 mL | Freq: Once | INTRAVENOUS | Status: AC
  Administered 2019-11-16: 10:00:00 500 mL via INTRAVENOUS

## 2019-11-16 MED ORDER — PHENOL 1.4 % MT LIQD
1.0000 | OROMUCOSAL | Status: DC | PRN
  Filled 2019-11-16: qty 177

## 2019-11-16 MED ORDER — PREDNISONE 20 MG PO TABS
20.0000 mg | ORAL_TABLET | Freq: Two times a day (BID) | ORAL | Status: DC
  Administered 2019-11-16 – 2019-11-17 (×2): 20 mg via ORAL
  Filled 2019-11-16 (×2): qty 1

## 2019-11-16 MED ORDER — DIGOXIN 125 MCG PO TABS
0.1250 mg | ORAL_TABLET | Freq: Every day | ORAL | Status: DC
  Administered 2019-11-16 – 2019-11-18 (×3): 0.125 mg via ORAL
  Filled 2019-11-16 (×3): qty 1

## 2019-11-16 MED ORDER — SODIUM CHLORIDE 0.9 % IV BOLUS
500.0000 mL | Freq: Once | INTRAVENOUS | Status: AC
  Administered 2019-11-16: 500 mL via INTRAVENOUS

## 2019-11-16 MED ORDER — SODIUM CHLORIDE 0.9 % IV BOLUS
1000.0000 mL | Freq: Once | INTRAVENOUS | Status: AC
  Administered 2019-11-16: 1000 mL via INTRAVENOUS

## 2019-11-16 MED ORDER — METOPROLOL TARTRATE 25 MG PO TABS
25.0000 mg | ORAL_TABLET | Freq: Three times a day (TID) | ORAL | Status: DC
  Administered 2019-11-16 – 2019-11-18 (×7): 25 mg via ORAL
  Filled 2019-11-16 (×7): qty 1

## 2019-11-16 MED ORDER — METOPROLOL TARTRATE 12.5 MG HALF TABLET
12.5000 mg | ORAL_TABLET | Freq: Four times a day (QID) | ORAL | Status: DC
  Administered 2019-11-16 (×3): 12.5 mg via ORAL
  Filled 2019-11-16 (×4): qty 1

## 2019-11-16 MED ORDER — AMIODARONE HCL 100 MG PO TABS
100.0000 mg | ORAL_TABLET | Freq: Two times a day (BID) | ORAL | Status: DC
  Administered 2019-11-16 (×2): 100 mg via ORAL
  Filled 2019-11-16 (×2): qty 1

## 2019-11-16 NOTE — Plan of Care (Signed)
  Problem: Education: Goal: Knowledge of General Education information will improve Description: Including pain rating scale, medication(s)/side effects and non-pharmacologic comfort measures 11/16/2019 0727 by Shanon Ace, RN Outcome: Progressing 11/15/2019 1900 by Shanon Ace, RN Outcome: Progressing   Problem: Health Behavior/Discharge Planning: Goal: Ability to manage health-related needs will improve 11/16/2019 0727 by Shanon Ace, RN Outcome: Progressing 11/15/2019 1900 by Shanon Ace, RN Outcome: Progressing   Problem: Clinical Measurements: Goal: Ability to maintain clinical measurements within normal limits will improve 11/16/2019 0727 by Shanon Ace, RN Outcome: Progressing 11/15/2019 1900 by Shanon Ace, RN Outcome: Progressing Goal: Will remain free from infection 11/16/2019 0727 by Shanon Ace, RN Outcome: Progressing 11/15/2019 1900 by Shanon Ace, RN Outcome: Progressing Goal: Diagnostic test results will improve 11/16/2019 0727 by Shanon Ace, RN Outcome: Progressing 11/15/2019 1900 by Shanon Ace, RN Outcome: Progressing Goal: Respiratory complications will improve 11/16/2019 0727 by Shanon Ace, RN Outcome: Progressing 11/15/2019 1900 by Shanon Ace, RN Outcome: Progressing Goal: Cardiovascular complication will be avoided 11/16/2019 0727 by Shanon Ace, RN Outcome: Progressing 11/15/2019 1900 by Shanon Ace, RN Outcome: Progressing   Problem: Activity: Goal: Risk for activity intolerance will decrease 11/16/2019 0727 by Shanon Ace, RN Outcome: Progressing 11/15/2019 1900 by Shanon Ace, RN Outcome: Progressing   Problem: Nutrition: Goal: Adequate nutrition will be maintained 11/16/2019 0727 by Shanon Ace, RN Outcome: Progressing 11/15/2019 1900 by Shanon Ace, RN Outcome: Progressing   Problem: Coping: Goal: Level of anxiety will decrease 11/16/2019 0727 by Shanon Ace, RN Outcome: Progressing 11/15/2019 1900 by Shanon Ace,  RN Outcome: Progressing   Problem: Elimination: Goal: Will not experience complications related to bowel motility 11/16/2019 0727 by Shanon Ace, RN Outcome: Progressing 11/15/2019 1900 by Shanon Ace, RN Outcome: Progressing Goal: Will not experience complications related to urinary retention 11/16/2019 0727 by Shanon Ace, RN Outcome: Progressing 11/15/2019 1900 by Shanon Ace, RN Outcome: Progressing   Problem: Pain Managment: Goal: General experience of comfort will improve 11/16/2019 0727 by Shanon Ace, RN Outcome: Progressing 11/15/2019 1900 by Shanon Ace, RN Outcome: Progressing   Problem: Safety: Goal: Ability to remain free from injury will improve 11/16/2019 0727 by Shanon Ace, RN Outcome: Progressing 11/15/2019 1900 by Shanon Ace, RN Outcome: Progressing   Problem: Skin Integrity: Goal: Risk for impaired skin integrity will decrease 11/16/2019 0727 by Shanon Ace, RN Outcome: Progressing 11/15/2019 1900 by Shanon Ace, RN Outcome: Progressing   Problem: Respiratory: Goal: Will maintain a patent airway 11/16/2019 0727 by Shanon Ace, RN Outcome: Progressing 11/15/2019 1900 by Shanon Ace, RN Outcome: Progressing Goal: Complications related to the disease process, condition or treatment will be avoided or minimized 11/16/2019 0727 by Shanon Ace, RN Outcome: Progressing 11/15/2019 1900 by Shanon Ace, RN Outcome: Progressing

## 2019-11-16 NOTE — Progress Notes (Signed)
[place] for heparin Indication: atrial fibrillation  Allergies  Allergen Reactions  . Novocain [Procaine] Other (See Comments)    Syncope   . Other     Other reaction(s): passes out    Patient Measurements: Height: 6\' 1"  (185.4 cm) Weight: 236 lb 12.4 oz (107.4 kg) IBW/kg (Calculated) : 79.9 Heparin Dosing Weight: 104kg  Vital Signs: Temp: 97.6 F (36.4 C) (02/08 2031) Temp Source: Oral (02/08 2031) BP: 101/63 (02/08 2031) Pulse Rate: 46 (02/08 2031)  Labs: Recent Labs    11/15/19 1118 11/15/19 1321 11/15/19 1700 11/16/19 0241 11/16/19 1015 11/16/19 2020  HGB 14.5  --   --  13.9  --   --   HCT 43.1  --   --  42.2  --   --   PLT 163  --   --  103*  --   --   APTT  --   --  31  --   --   --   LABPROT  --   --  16.0*  --   --   --   INR  --   --  1.3*  --   --   --   HEPARINUNFRC  --   --   --  <0.10* 0.24* 0.47  CREATININE 1.80*  --   --  2.75*  --   --   TROPONINIHS 38* 39*  --   --   --   --     Estimated Creatinine Clearance: 31.7 mL/min (A) (by C-G formula based on SCr of 2.75 mg/dL (H)).  Assessment: Melvin Ramirez presenting with cough, palpitations, now with AFib w/RVR.  Not on anticoagulation PTA, CBC wnl.    Hep lvl within goal this evening  Goal of Therapy:  Heparin level 0.3-0.7 units/ml Monitor platelets by anticoagulation protocol: Yes   Plan:  Continue heparin 1700 units/hr Daily hep lvl cbc  Barth Kirks, PharmD, BCPS, BCCCP Clinical Pharmacist [phone]  Please check AMION for all [place] numbers  11/16/2019 8:49 PM

## 2019-11-16 NOTE — Progress Notes (Signed)
[place] for heparin Indication: atrial fibrillation  Allergies  Allergen Reactions  . Novocain [Procaine] Other (See Comments)    Syncope   . Other     Other reaction(s): passes out    Patient Measurements: Height: 6\' 1"  (185.4 cm) Weight: 236 lb 12.4 oz (107.4 kg) IBW/kg (Calculated) : 79.9 Heparin Dosing Weight: 104kg  Vital Signs: Temp: 97.7 F (36.5 C) (02/08 1000) Temp Source: Oral (02/08 1000) BP: 101/72 (02/08 1045) Pulse Rate: 48 (02/08 1045)  Labs: Recent Labs    11/15/19 1118 11/15/19 1321 11/15/19 1700 11/16/19 0241 11/16/19 1015  HGB 14.5  --   --  13.9  --   HCT 43.1  --   --  42.2  --   PLT 163  --   --  103*  --   APTT  --   --  31  --   --   LABPROT  --   --  16.0*  --   --   INR  --   --  1.3*  --   --   HEPARINUNFRC  --   --   --  <0.10* 0.24*  CREATININE 1.80*  --   --  2.75*  --   TROPONINIHS 38* 39*  --   --   --     Estimated Creatinine Clearance: 31.7 mL/min (A) (by C-G formula based on SCr of 2.75 mg/dL (H)).   Medical History: Past Medical History:  Diagnosis Date  . Diabetes mellitus ([place])   . History of kidney cancer    Assessment: 75 YOM presenting with cough, palpitations, now with AFib w/RVR.  Not on anticoagulation PTA, CBC wnl.    Heparin level just below goal this morning. Bleeding from IV site noted overnight after patient pulled out IV, new IV in place. No further bleeding noted.   Goal of Therapy:  Heparin level 0.3-0.7 units/ml Monitor platelets by anticoagulation protocol: Yes   Plan:  Increase heparin to 1700 units/hr Recheck heparin level this evening to confirm  Erin Hearing PharmD., BCPS Clinical Pharmacist 11/16/2019 11:12 AM

## 2019-11-16 NOTE — TOC Benefit Eligibility Note (Signed)
Transition of Care [facility]) Benefit Eligibility Note    Patient Details  Name: Melvin Ramirez MRN: [id number] Date of Birth: [birth year]   Medication/Dose: Eliquis 2.5 bid or  Covered?: Yes  Tier: Other(No available)  Prescription Coverage Preferred Pharmacy: [facility]  Spoke with Person/Company/Phone Number:: Arbie Cookey / [employer]/ [phone]  Co-Pay: 482.65 for a 30 day supply  Prior Approval: No  Deductible: Unmet(1500.00)  Additional Notes: Xarelto 952.01 for a 30 day supply No prior Auth  Needed patient has not met his [place] Phone Number: 11/16/2019, 4:28 PM

## 2019-11-16 NOTE — Progress Notes (Signed)
Ref: [name], MD   Subjective:  Atrial fibrillation continues with moderate improvement in heart rate control. Echocardiogram shows moderate to severe LV systolic dysfunction with 25-30 % EF, moderate MR and TR. Creatinine increased to 2.75 mg. Patient had exertional dyspnea and weakness starting 6 months before COVID pneumonia. Blood pressure improving post fluid bolus and IV lanoxin use. Diltiazem discontinued for CHF. 10-15 pounds weight loss since COVID infection due to swallowing trouble and lack of appetite. VQ scan negative (less likely) for PE. EKG suggestive of CAD.  Objective:  Vital Signs in the last 24 hours: Temp:  [97.4 F (36.3 C)-98.4 F (36.9 C)] 97.9 F (36.6 C) (02/08 1546) Pulse Rate:  [34-112] 48 (02/08 1546) Cardiac Rhythm: Atrial fibrillation (02/08 0820) Resp:  [18-35] 25 (02/08 1200) BP: (67-126)/(39-89) 123/78 (02/08 1738) SpO2:  [87 %-98 %] 95 % (02/08 1200) Weight:  [107.4 kg] 107.4 kg (02/08 0246)  Physical Exam: BP Readings from Last 1 Encounters:  11/16/19 123/78     Wt Readings from Last 1 Encounters:  11/16/19 107.4 kg    Weight change:  Body mass index is 31.24 kg/m. HEENT: [place]/AT, Eyes-Blue, PERL, EOMI, Conjunctiva-Pink, Sclera-Non-icteric Neck: No JVD, No bruit, Trachea midline. Lungs:  Clearing, Bilateral. Cardiac:  Regular rhythm, normal S1 and S2, no S3. II/VI systolic murmur. Abdomen:  Soft, non-tender. BS present. Extremities:  1 + edema present. No cyanosis. No clubbing. CNS: AxOx3, Cranial nerves grossly intact, moves all 4 extremities.  Skin: Warm and dry.   Intake/Output from previous day: 02/07 0701 - 02/08 0700 In: 1006 [I.V.:1006] Out: -     Lab Results: BMET    Component Value Date/Time   NA 136 11/16/2019 0241   NA 133 (L) 11/15/2019 1118   NA 137 10/26/2019 1913   K 5.6 (H) 11/16/2019 0241   K 4.7 11/15/2019 1118   K 4.4 10/26/2019 1913   CL 98 11/16/2019 0241   CL 104 11/15/2019 1118   CL 105  10/26/2019 1913   CO2 17 (L) 11/16/2019 0241   CO2 16 (L) 11/15/2019 1118   CO2 21 (L) 10/26/2019 1913   GLUCOSE 123 (H) 11/16/2019 0241   GLUCOSE 184 (H) 11/15/2019 1118   GLUCOSE 150 (H) 10/26/2019 1913   BUN 44 (H) 11/16/2019 0241   BUN 35 (H) 11/15/2019 1118   BUN 24 (H) 10/26/2019 1913   CREATININE 2.75 (H) 11/16/2019 0241   CREATININE 1.80 (H) 11/15/2019 1118   CREATININE 1.61 (H) 10/26/2019 1913   CALCIUM 8.7 (L) 11/16/2019 0241   CALCIUM 8.7 (L) 11/15/2019 1118   CALCIUM 8.5 (L) 10/26/2019 1913   GFRNONAA 22 (L) 11/16/2019 0241   GFRNONAA 37 (L) 11/15/2019 1118   GFRNONAA 42 (L) 10/26/2019 1913   GFRAA 26 (L) 11/16/2019 0241   GFRAA 43 (L) 11/15/2019 1118   GFRAA 49 (L) 10/26/2019 1913   CBC    Component Value Date/Time   WBC 13.2 (H) 11/16/2019 0241   RBC 4.47 11/16/2019 0241   HGB 13.9 11/16/2019 0241   HCT 42.2 11/16/2019 0241   PLT 103 (L) 11/16/2019 0241   MCV 94.4 11/16/2019 0241   MCH 31.1 11/16/2019 0241   MCHC 32.9 11/16/2019 0241   RDW 14.2 11/16/2019 0241   LYMPHSABS 0.9 11/16/2019 0241   MONOABS 0.8 11/16/2019 0241   EOSABS 0.0 11/16/2019 0241   BASOSABS 0.1 11/16/2019 0241   HEPATIC Function Panel Recent Labs    10/26/19 1913 11/15/19 1321  PROT 6.1* 5.8*   HEMOGLOBIN  A1C No components found for: HGA1C,  MPG CARDIAC ENZYMES No results found for: CKTOTAL, CKMB, CKMBINDEX, TROPONINI BNP No results for input(s): PROBNP in the last 8760 hours. TSH Recent Labs    11/15/19 1700  TSH 1.647   CHOLESTEROL No results for input(s): CHOL in the last 8760 hours.  Scheduled Meds: . amiodarone  100 mg Oral BID  . vitamin C  250 mg Oral Daily  . atorvastatin  10 mg Oral q1800  . insulin aspart  0-15 Units Subcutaneous TID WC  . metoprolol tartrate  12.5 mg Oral QID  . multivitamin with minerals  1 tablet Oral Daily  . omega-3 acid ethyl esters  1 g Oral Daily  . predniSONE  20 mg Oral BID WC  . sodium chloride flush  3 mL Intravenous Q12H    Continuous Infusions: . sodium chloride    . heparin 1,700 Units/hr (11/16/19 1500)   PRN Meds:.sodium chloride, phenol, sodium chloride flush  Assessment/Plan: Acute systolic left heart failure Moderate MR and TR Moderate RV systolic dysfunction Atrial fibrillation, recent, [id number] score of 3 Type 2 DM with hyperglycemia CKD, IV S/P left nephrectomy Obesity Recent COVID pneumonia Possible myocarditis due to COVID infection. Possible CAD  Discussed need for rate control followed by possible cardioversion, IP or OP. Discussed fluid restriction and salt restriction for acute LV systolic dysfunction. Discussed EP evaluation for atrial fibrillation ablation. Discussed pulmonary doctor consult. Discussed heart failure doctor consult. Discussed need for cardiac catheterization for CAD and right and left heart failure. Patient and wife prefer medical therapy for now.  Add Prednisone for pulmonary and heart function.    LOS: 1 day   Time spent including chart review, lab review, examination, discussion with patient : 50 min   Dixie Dials  MD  11/16/2019, 7:12 PM

## 2019-11-16 NOTE — Progress Notes (Signed)
On call provider gave verbal order to d/c Cardizem drip. Pt currently resting, vital signs stable.

## 2019-11-16 NOTE — Progress Notes (Signed)
ANTICOAGULATION CONSULT NOTE - Follow Up Consult  Pharmacy Consult for heparin Indication: atrial fibrillation  Labs: Recent Labs    11/15/19 1118 11/15/19 1321 11/15/19 1700 11/16/19 0241  HGB 14.5  --   --  13.9  HCT 43.1  --   --  42.2  PLT 163  --   --  PENDING  APTT  --   --  31  --   LABPROT  --   --  16.0*  --   INR  --   --  1.3*  --   HEPARINUNFRC  --   --   --  <0.10*  CREATININE 1.80*  --   --   --   TROPONINIHS 38* 39*  --   --     Assessment: 73yo male subtherapeutic on heparin with initial dosing for Afib; no gtt issues per RN though she does note some bleeding at IV site, pressure dressing now applied.  Goal of Therapy:  Heparin level 0.3-0.7 units/ml   Plan:  Will increase heparin gtt cautiously to 1500 units/hr and check level in 6 hours.    Wynona Neat, PharmD, BCPS  11/16/2019,4:12 AM

## 2019-11-16 NOTE — Progress Notes (Signed)
  Echocardiogram 2D Echocardiogram has been performed.  Melvin Ramirez 11/16/2019, 10:40 AM

## 2019-11-17 ENCOUNTER — Inpatient Hospital Stay (HOSPITAL_COMMUNITY): Payer: Commercial Managed Care - PPO

## 2019-11-17 DIAGNOSIS — R7989 Other specified abnormal findings of blood chemistry: Secondary | ICD-10-CM

## 2019-11-17 DIAGNOSIS — J9601 Acute respiratory failure with hypoxia: Secondary | ICD-10-CM

## 2019-11-17 LAB — URINALYSIS, ROUTINE W REFLEX MICROSCOPIC
Bilirubin Urine: NEGATIVE
Glucose, UA: NEGATIVE mg/dL
Hgb urine dipstick: NEGATIVE
Ketones, ur: 5 mg/dL — AB
Leukocytes,Ua: NEGATIVE
Nitrite: NEGATIVE
Protein, ur: NEGATIVE mg/dL
Specific Gravity, Urine: 1.017 (ref 1.005–1.030)
pH: 5 (ref 5.0–8.0)

## 2019-11-17 LAB — CBC
HCT: 40.1 % (ref 39.0–52.0)
Hemoglobin: 13.6 g/dL (ref 13.0–17.0)
MCH: 31.7 pg (ref 26.0–34.0)
MCHC: 33.9 g/dL (ref 30.0–36.0)
MCV: 93.5 fL (ref 80.0–100.0)
Platelets: 149 10*3/uL — ABNORMAL LOW (ref 150–400)
RBC: 4.29 MIL/uL (ref 4.22–5.81)
RDW: 14.5 % (ref 11.5–15.5)
WBC: 11.8 10*3/uL — ABNORMAL HIGH (ref 4.0–10.5)
nRBC: 0 % (ref 0.0–0.2)

## 2019-11-17 LAB — STREP PNEUMONIAE URINARY ANTIGEN: Strep Pneumo Urinary Antigen: NEGATIVE

## 2019-11-17 LAB — GLUCOSE, CAPILLARY
Glucose-Capillary: 120 mg/dL — ABNORMAL HIGH (ref 70–99)
Glucose-Capillary: 182 mg/dL — ABNORMAL HIGH (ref 70–99)
Glucose-Capillary: 156 mg/dL — ABNORMAL HIGH (ref 70–99)
Glucose-Capillary: 165 mg/dL — ABNORMAL HIGH (ref 70–99)

## 2019-11-17 LAB — D-DIMER, QUANTITATIVE: D-Dimer, Quant: 7.43 ug/mL-FEU — ABNORMAL HIGH (ref 0.00–0.50)

## 2019-11-17 LAB — BASIC METABOLIC PANEL
Anion gap: 12 (ref 5–15)
BUN: 55 mg/dL — ABNORMAL HIGH (ref 8–23)
CO2: 18 mmol/L — ABNORMAL LOW (ref 22–32)
Calcium: 8 mg/dL — ABNORMAL LOW (ref 8.9–10.3)
Chloride: 104 mmol/L (ref 98–111)
Creatinine, Ser: 2.69 mg/dL — ABNORMAL HIGH (ref 0.61–1.24)
GFR calc Af Amer: 26 mL/min — ABNORMAL LOW (ref >60)
GFR calc non Af Amer: 23 mL/min — ABNORMAL LOW (ref >60)
Glucose, Bld: 151 mg/dL — ABNORMAL HIGH (ref 70–99)
Potassium: 4.9 mmol/L (ref 3.5–5.1)
Sodium: 134 mmol/L — ABNORMAL LOW (ref 135–145)

## 2019-11-17 LAB — SODIUM, URINE, RANDOM: Sodium, Ur: 50 mmol/L

## 2019-11-17 LAB — PROCALCITONIN: Procalcitonin: 2.83 ng/mL

## 2019-11-17 LAB — HEPARIN LEVEL (UNFRACTIONATED): Heparin Unfractionated: 0.48 IU/mL (ref 0.30–0.70)

## 2019-11-17 LAB — CREATININE, URINE, RANDOM: Creatinine, Urine: 136.6 mg/dL

## 2019-11-17 LAB — SEDIMENTATION RATE: Sed Rate: 20 mm/hr — ABNORMAL HIGH (ref 0–16)

## 2019-11-17 MED ORDER — AMIODARONE HCL 200 MG PO TABS
200.0000 mg | ORAL_TABLET | Freq: Two times a day (BID) | ORAL | Status: DC
  Administered 2019-11-17 – 2019-11-18 (×2): 200 mg via ORAL
  Filled 2019-11-17 (×2): qty 1

## 2019-11-17 NOTE — Progress Notes (Signed)
[place] for heparin Indication: atrial fibrillation  Allergies  Allergen Reactions  . Novocain [Procaine] Other (See Comments)    Syncope   . Other     Other reaction(s): passes out    Patient Measurements: Height: 6\' 1"  (185.4 cm) Weight: 239 lb 6.7 oz (108.6 kg) IBW/kg (Calculated) : 79.9 Heparin Dosing Weight: 104kg  Vital Signs: Temp: 97.4 F (36.3 C) (02/09 0749) Temp Source: Oral (02/09 0749) BP: 113/76 (02/09 0924) Pulse Rate: 67 (02/09 0800)  Labs: Recent Labs    11/15/19 1118 11/15/19 1118 11/15/19 1321 11/15/19 1700 11/16/19 0241 11/16/19 0241 11/16/19 1015 11/16/19 2020 11/17/19 0204  HGB 14.5   < >  --   --  13.9  --   --   --  13.6  HCT 43.1  --   --   --  42.2  --   --   --  40.1  PLT 163  --   --   --  103*  --   --   --  149*  APTT  --   --   --  31  --   --   --   --   --   LABPROT  --   --   --  16.0*  --   --   --   --   --   INR  --   --   --  1.3*  --   --   --   --   --   HEPARINUNFRC  --   --   --   --  <0.10*   < > 0.24* 0.47 0.48  CREATININE 1.80*  --   --   --  2.75*  --   --   --  2.69*  TROPONINIHS 38*  --  39*  --   --   --   --   --   --    < > = values in this interval not displayed.    Estimated Creatinine Clearance: 32.6 mL/min (A) (by C-G formula based on SCr of 2.69 mg/dL (H)).  Assessment: 41 YOM presenting with cough, palpitations, now with AFib w/RVR.  Not on anticoagulation PTA.  Heparin level continues to be at goal this morning on 1700 units/hr. No bleeding noted, cbc has remained stable.   Goal of Therapy:  Heparin level 0.3-0.7 units/ml Monitor platelets by anticoagulation protocol: Yes   Plan:  Continue heparin 1700 units/hr Daily heparin level, cbc  Erin Hearing PharmD., BCPS Clinical Pharmacist 11/17/2019 10:38 AM

## 2019-11-17 NOTE — Progress Notes (Signed)
Ref: [name], MD   Subjective:  Shortness of breath continues at rest and increases with any activity. Heart rate control improving and BP appears stable. Repeat Portable CXR appears stable with Sequela of COVID pneumonia. No pulmonary edema. Creatinine essentially unchanged at 2.69 mg. WBC count 11.8K.   Objective:  Vital Signs in the last 24 hours: Temp:  [97.4 F (36.3 C)-98 F (36.7 C)] 98 F (36.7 C) (02/09 1100) Pulse Rate:  [40-100] 75 (02/09 1100) Cardiac Rhythm: Atrial fibrillation (02/09 0803) Resp:  [16-27] 16 (02/09 1100) BP: (98-123)/(57-82) 113/75 (02/09 1100) SpO2:  [90 %-99 %] 94 % (02/09 1100) Weight:  [108.6 kg] 108.6 kg (02/09 0353)  Physical Exam: BP Readings from Last 1 Encounters:  11/17/19 113/75     Wt Readings from Last 1 Encounters:  11/17/19 108.6 kg    Weight change: -0.263 kg Body mass index is 31.59 kg/m. HEENT: [place]/AT, Eyes-Blue, PERL, EOMI, Conjunctiva-Pink, Sclera-Non-icteric Neck: No JVD, No bruit, Trachea midline. Lungs:  Clear, Bilateral. Cardiac:  Regular rhythm, normal S1 and S2, no S3. II/VI systolic murmur. Abdomen:  Soft, non-tender. BS present. Extremities:  1 + edema present. No cyanosis. No clubbing. CNS: AxOx3, Cranial nerves grossly intact, moves all 4 extremities.  Skin: Warm and dry.   Intake/Output from previous day: 02/08 0701 - 02/09 0700 In: 871.8 [I.V.:871.8] Out: 175 [Urine:175]    Lab Results: BMET    Component Value Date/Time   NA 134 (L) 11/17/2019 0204   NA 136 11/16/2019 0241   NA 133 (L) 11/15/2019 1118   K 4.9 11/17/2019 0204   K 5.6 (H) 11/16/2019 0241   K 4.7 11/15/2019 1118   CL 104 11/17/2019 0204   CL 98 11/16/2019 0241   CL 104 11/15/2019 1118   CO2 18 (L) 11/17/2019 0204   CO2 17 (L) 11/16/2019 0241   CO2 16 (L) 11/15/2019 1118   GLUCOSE 151 (H) 11/17/2019 0204   GLUCOSE 123 (H) 11/16/2019 0241   GLUCOSE 184 (H) 11/15/2019 1118   BUN 55 (H) 11/17/2019 0204   BUN 44 (H)  11/16/2019 0241   BUN 35 (H) 11/15/2019 1118   CREATININE 2.69 (H) 11/17/2019 0204   CREATININE 2.75 (H) 11/16/2019 0241   CREATININE 1.80 (H) 11/15/2019 1118   CALCIUM 8.0 (L) 11/17/2019 0204   CALCIUM 8.7 (L) 11/16/2019 0241   CALCIUM 8.7 (L) 11/15/2019 1118   GFRNONAA 23 (L) 11/17/2019 0204   GFRNONAA 22 (L) 11/16/2019 0241   GFRNONAA 37 (L) 11/15/2019 1118   GFRAA 26 (L) 11/17/2019 0204   GFRAA 26 (L) 11/16/2019 0241   GFRAA 43 (L) 11/15/2019 1118   CBC    Component Value Date/Time   WBC 11.8 (H) 11/17/2019 0204   RBC 4.29 11/17/2019 0204   HGB 13.6 11/17/2019 0204   HCT 40.1 11/17/2019 0204   PLT 149 (L) 11/17/2019 0204   MCV 93.5 11/17/2019 0204   MCH 31.7 11/17/2019 0204   MCHC 33.9 11/17/2019 0204   RDW 14.5 11/17/2019 0204   LYMPHSABS 0.9 11/16/2019 0241   MONOABS 0.8 11/16/2019 0241   EOSABS 0.0 11/16/2019 0241   BASOSABS 0.1 11/16/2019 0241   HEPATIC Function Panel Recent Labs    10/26/19 1913 11/15/19 1321  PROT 6.1* 5.8*   HEMOGLOBIN A1C No components found for: HGA1C,  MPG CARDIAC ENZYMES No results found for: CKTOTAL, CKMB, CKMBINDEX, TROPONINI BNP No results for input(s): PROBNP in the last 8760 hours. TSH Recent Labs    11/15/19 1700  TSH  1.647   CHOLESTEROL No results for input(s): CHOL in the last 8760 hours.  Scheduled Meds: . amiodarone  200 mg Oral BID  . vitamin C  250 mg Oral Daily  . atorvastatin  10 mg Oral q1800  . digoxin  0.125 mg Oral Daily  . insulin aspart  0-15 Units Subcutaneous TID WC  . metoprolol tartrate  25 mg Oral TID  . multivitamin with minerals  1 tablet Oral Daily  . omega-3 acid ethyl esters  1 g Oral Daily  . predniSONE  20 mg Oral BID WC  . sodium chloride flush  3 mL Intravenous Q12H   Continuous Infusions: . sodium chloride    . heparin 1,700 Units/hr (11/17/19 0800)   PRN Meds:.sodium chloride, phenol, sodium chloride flush  Assessment/Plan: Acute systolic left heart failure, HFrEF Moderate MR  and TR Moderate RV systolic dysfunction Atrial fibrillation, recent Type 2 DM with hyperglycemia CKD, IV S/P left nephrectomy Obesity Recent COVID pneumonia with pulmonary fibrosis Possible myocarditis Possible CAD  Pulmonary consult. Nephrology consult.   LOS: 2 days   Time spent including chart review, lab review, examination, discussion with patient and nurse and consultants : 40 min   Dixie Dials  MD  11/17/2019, 11:02 AM

## 2019-11-17 NOTE — Consult Note (Signed)
[facility] Consult Note    Assessment/ Plan:   1.AKI on CKD IIIa, nonoliguric, solitary kidney: Cr 1.8 >2.75 > 2.69 today. Baseline 1.4. Suspect may be prerenal 2/2 intravascular depletion with total body fluid overload vs mild tubular ischemia/ATN in the setting of poor perfusion in afib w/ RVR. U/A unremarkable. Will obtain renal U/S and urine electrolytes (Na/Cr) to further assess. Hopeful creatinine will continue to downtrend, if not may consider lasix trial tomorrow, 2/10.     2.  Acute hypoxemic respiratory failure: On 2L [place], up 1.7L since admit. Unclear, 2/2 to ILD vs acute CHF. Mildly hypervolemic on exam. Pulmonary on board, pursuing high res CT w/ autoimmune/serology workup.  3. 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 Management per cardiology.   4. Hyperglycemia induced-hyponatremia: Na 134. Corrected 135 for glucose. Will monitor, likely component of hypervolemic hyponatremia as well.    5. T2DM: A1c 7.3. Diet controlled, SSI per primary.   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 fib with RVR and acute hypoxemic respiratory failure requiring 2-3L [place]. Managed with heparin, dilt, and now on po amiodarone. Currently being managed by cardiology primary team, pulmonology was also consulted due to respiratory status with in the context of recent COVID-19 infection, concerns for interstitial lung disease given worsening respiratory status several months prior to presentation and groundglass opacities on CT. Proceeding with further imaging and autoimmune/serologic w/u. Hospital course thus far complicated by newfound HFrEF with an EF of 25-30% and worsening renal function.  Nephrology was consulted due to AKI on CKD,  request for optimization to preserve renal function.  Creatinine 1.8 >2.75 > 2.69 today. BUN 55. Baseline around 1.4, reviewed previous labs from 2018-on with wife at bedside through his [place] online portal, however Cr in Jan 1.6. Follows with urology on an annual basis due to previous nephrectomy. Does not see a nephrologist. Has not received any contrast during this hospitalization, but had a CTA in mid Jan. I&O up 1.7L since admit. Weight stable since admit around 109kg. Making urine, yellow in color, reports frequent nocturia at home. Taking saw palmetto and flomax for his BPH.    Objective:   BP 113/75 (BP Location: Left Arm)   Pulse 75   Temp 98 F (36.7 C) (Oral)   Resp 16   Ht 6\' 1"  (1.854 m)   Wt 108.6 kg   SpO2 94%   BMI 31.59 kg/m   Intake/Output Summary (Last 24 hours) at 11/17/2019 1217 Last data filed at 11/17/2019 0800 Gross per 24 hour  Intake 882.8 ml  Output 175 ml  Net 707.8 ml   Weight change: -0.263 kg  Physical Exam: General: Older gentleman, NAD, laying comfortably with [place] on, wife at bedside  HEENT: NCAT, no JVD seen Cardiac: Irregularly irregular, no murmur appreciated  Lungs: Bibasilar crackles. Unlabored breathing on 2L Abdomen: soft, non-tender Msk: Moves all extremities spontaneously  Ext: Warm, dry, 2+ distal pulses, 1+ pitting edema to midshin bilaterally, no asterixis b/l    Imaging: NM PULMONARY VENT AND PERF (V/Q Scan)  Result Date: 11/15/2019 CLINICAL DATA:  Cough, palpitations EXAM: NUCLEAR MEDICINE VENTILATION - PERFUSION LUNG SCAN TECHNIQUE: Ventilation images were obtained in multiple projections using inhaled aerosol Tc-12m DTPA. Perfusion images were  obtained in multiple projections after intravenous injection of Tc-78m MAA. RADIOPHARMACEUTICALS:  38.5 mCi of Tc-26m DTPA aerosol inhalation and 1.6 mCi Tc73m MAA IV COMPARISON:  Chest x-ray today FINDINGS: Ventilation: Patchy nonsegmental ventilation defects. Perfusion: Matching patchy  nonsegmental perfusion defects. IMPRESSION: Patchy nonsegmental matched ventilation and perfusion defects. No miss matched perfusion defects. Study is low probability for pulmonary embolus. Electronically Signed   By: Rolm Baptise M.D.   On: 11/15/2019 18:57   DG Chest Port 1 View  Result Date: 11/17/2019 CLINICAL DATA:  Shortness of breath EXAM: PORTABLE CHEST 1 VIEW COMPARISON:  11/15/2019 FINDINGS: Cardiac shadow is within normal limits. Lungs demonstrate patchy opacities slightly increased when compared with prior exam. These are consistent with atypical pneumonia. No sizable effusion is seen. No bony abnormality is noted. IMPRESSION: Persistent and slightly increased opacities bilaterally consistent with atypical pneumonia. Electronically Signed   By: Inez Catalina M.D.   On: 11/17/2019 10:44   DG Chest Port 1 View  Result Date: 11/15/2019 CLINICAL DATA:  74 year old male with a history of shortness of breath EXAM: PORTABLE CHEST 1 VIEW COMPARISON:  10/26/2019, CT chest 10/26/2019 FINDINGS: Cardiomediastinal silhouette unchanged in size and contour. No pneumothorax. Similar appearance of reticulonodular opacities throughout the lungs with no significant change to the x-ray 10/26/2019. No large pleural effusion. No displaced fracture IMPRESSION: Similar appearance of bilateral reticulonodular opacities to the comparison chest x-rays, compatible with COVID pneumonia. Electronically Signed   By: Corrie Mckusick D.O.   On: 11/15/2019 12:48   ECHOCARDIOGRAM COMPLETE  Result Date: 11/16/2019    ECHOCARDIOGRAM REPORT   Patient Name:   Melvin Ramirez Date of Exam: 11/16/2019 Medical Rec #:  [id number]     Height:       73.0 in Accession #:    [id number]    Weight:       236.8 lb Date of Birth:  [birth year]     BSA:          2.31 m Patient Age:    43 years      BP:           103/68 mmHg Patient Gender: M             HR:           99 bpm. Exam Location:  Inpatient Procedure: 2D Echo, Cardiac Doppler and Color Doppler  Indications:     Atrial Fibrillation 427.31  History:         Patient has no prior history of Echocardiogram examinations.                  Arrythmias:Atrial Fibrillation; Risk Factors:Diabetes and                  Former Smoker.  Sonographer:     Vickie Epley RDCS Referring Phys:  [place] Diagnosing Phys: Dixie Dials MD IMPRESSIONS  1. Left ventricular ejection fraction, by estimation, is 25 to 30%. The left ventricle has severely decreased function. The left ventrical demonstrates global hypokinesis. The left ventricular internal cavity size was mildly dilated. Indeterminate diastolic filling due to E-A fusion. There is severe hypokinesis of the left ventricular, entire anterior wall. There is mild hypokinesis of the left ventricular, entire inferior wall.  2. Right ventricular systolic function is mildly reduced. The right ventricular size is mildly enlarged. There is moderately elevated pulmonary artery systolic pressure.  3. Moderate mitral valve regurgitation.  4. Tricuspid valve regurgitation is moderate.  5. The aortic valve  is tricuspid. Aortic valve regurgitation is mild. Conclusion(s)/Recomendation(s): Findings consistent with dilated cardiomyopathy. FINDINGS  Left Ventricle: Left ventricular ejection fraction, by estimation, is 25 to 30%. The left ventricle has severely decreased function. The left ventricle demonstrates global hypokinesis. Severe hypokinesis of the left ventricular, entire anterior wall. Mild hypokinesis of the left ventricular, entire inferior wall. The left ventricular internal cavity size was mildly dilated. There is no left ventricular hypertrophy.  LV Wall Scoring: The mid and distal anterior wall, apical lateral segment, and apex are akinetic. The antero-lateral wall, entire septum, entire inferior wall, posterior wall, and basal anterior segment are hypokinetic. Right Ventricle: The right ventricular size is mildly enlarged. No increase in right ventricular wall  thickness. Right ventricular systolic function is mildly reduced. There is moderately elevated pulmonary artery systolic pressure. The tricuspid regurgitant velocity is 3.00 m/s, and with an assumed right atrial pressure of 15 mmHg, the estimated right ventricular systolic pressure is 68.3 mmHg. Left Atrium: Left atrial size was moderately dilated. Right Atrium: Right atrial size was moderately dilated. Pericardium: There is no evidence of pericardial effusion. Mitral Valve: The mitral valve is normal in structure and function. Moderate mitral valve regurgitation. Tricuspid Valve: The tricuspid valve is normal in structure. Tricuspid valve regurgitation is moderate. Aortic Valve: The aortic valve is tricuspid. . There is mild thickening and mild calcification of the aortic valve. Aortic valve regurgitation is mild. Mild aortic valve annular calcification. There is mild thickening of the aortic valve. There is mild calcification of the aortic valve. Pulmonic Valve: The pulmonic valve was grossly normal. Pulmonic valve regurgitation is mild. Aorta: The aortic root and ascending aorta are structurally normal, with no evidence of dilitation. There is mild (Grade II) atheroma plaque involving the ascending aorta. Venous: The inferior vena cava is dilated in size with less than 50% respiratory variability, suggesting right atrial pressure of 15 mmHg. IAS/Shunts: No atrial level shunt detected by color flow Doppler.  LEFT VENTRICLE PLAX 2D LVIDd:         6.31 cm LVIDs:         5.33 cm LV PW:         0.89 cm LV IVS:        0.89 cm LVOT diam:     2.30 cm LV SV:         41.86 ml LV SV Index:   27.24 LVOT Area:     4.15 cm  LV Volumes (MOD) LV area d, A2C:    51.60 cm LV area d, A4C:    46.70 cm LV area s, A2C:    44.70 cm LV area s, A4C:    42.00 cm LV major d, A2C:   9.26 cm LV major d, A4C:   9.29 cm LV major s, A2C:   8.93 cm LV major s, A4C:   9.32 cm LV vol d, MOD A2C: 245.0 ml LV vol d, MOD A4C: 194.0 ml LV vol s,  MOD A2C: 187.0 ml LV vol s, MOD A4C: 158.0 ml LV SV MOD A2C:     58.0 ml LV SV MOD A4C:     194.0 ml LV SV MOD BP:      42.7 ml LEFT ATRIUM             Index       RIGHT ATRIUM           Index LA diam:        5.10 cm 2.21 cm/m  RA Area:  25.00 cm LA Vol (A2C):   88.5 ml 38.28 ml/m RA Volume:   82.50 ml  35.69 ml/m LA Vol (A4C):   59.2 ml 25.61 ml/m LA Biplane Vol: 74.5 ml 32.23 ml/m  AORTIC VALVE LVOT Vmax:   57.70 cm/s LVOT Vmean:  41.050 cm/s LVOT VTI:    0.101 m  AORTA Ao Root diam: 3.70 cm TRICUSPID VALVE TR Peak grad:   36.0 mmHg TR Vmax:        300.00 cm/s  SHUNTS Systemic VTI:  0.10 m Systemic Diam: 2.30 cm Dixie Dials MD Electronically signed by Dixie Dials MD Signature Date/Time: 11/16/2019/11:08:05 AM    Final     Labs: BMET Recent Labs  Lab 11/15/19 1118 11/16/19 0241 11/17/19 0204  NA 133* 136 134*  K 4.7 5.6* 4.9  CL 104 98 104  CO2 16* 17* 18*  GLUCOSE 184* 123* 151*  BUN 35* 44* 55*  CREATININE 1.80* 2.75* 2.69*  CALCIUM 8.7* 8.7* 8.0*   CBC Recent Labs  Lab 11/15/19 1118 11/16/19 0241 11/17/19 0204  WBC 12.0* 13.2* 11.8*  NEUTROABS  --  11.0*  --   HGB 14.5 13.9 13.6  HCT 43.1 42.2 40.1  MCV 93.1 94.4 93.5  PLT 163 103* 149*    Medications:    . amiodarone  200 mg Oral BID  . vitamin C  250 mg Oral Daily  . atorvastatin  10 mg Oral q1800  . digoxin  0.125 mg Oral Daily  . insulin aspart  0-15 Units Subcutaneous TID WC  . metoprolol tartrate  25 mg Oral TID  . multivitamin with minerals  1 tablet Oral Daily  . omega-3 acid ethyl esters  1 g Oral Daily  . sodium chloride flush  3 mL Intravenous Q12H     Patriciaann Clan, DO  Family Medicine PGY-2  11/17/2019, 12:17 PM

## 2019-11-17 NOTE — Progress Notes (Signed)
Bilateral lower extremity venous duplex complete.  Please see CV Proc tab for preliminary results.   Critical findings reported to Dr. Doylene Canard @ 15:45. Lita Mains- RDMS, RVT 3:54 PM  11/17/2019

## 2019-11-17 NOTE — Progress Notes (Signed)
Transported to nuclear  Med by bed awake and alert

## 2019-11-17 NOTE — Consult Note (Signed)
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  He is also status post nephrectomy for cancer many years ago which apparently was his previous hospitalization [but his creatinine in January 2021 was 1.6 mg percent; no prior creatinine.  Also 70 pack smoking history but quit 20 years ago.  In the early part of January 2021 he suffered from COVID-19 along with his wife.  He thinks he is occupationally exposed.  Review of the chart indicates that on October 23, 2019 he is status post monoclonal antibody infusion against COVID-19.  Then 3 days later on October 26, 2019 he ended up in the emergency department with worsening respiratory symptoms.  D-dimer was high.  CT angiogram ruled out pulmonary embolism but did show patchy groundglass opacities [that I personally visualized and interpreted].  But despite this he has had progressive worsening shortness of breath and fatigue and therefore admitted and found to be in acute hypoxemic respiratory failure requiring a few liters of nasal cannula.  Due to patchy infiltrates that seem worse on the chest x-ray this admission compared to mid January 2021 pulmonary has been consulted particularly in the post COVID-19 setting.  According to the wife he has had fatigue for 9-12 months and shortness of breath that has been getting worse for the last 3-4 months.  And everything was accentuated after COVID-19 in January 2021.  There is no clear-cut history of orthopnea proximal nocturnal dyspnea.  Not known to have any autoimmune conditions.  Post admission noticed to have atrial fibrillation and started on amiodarone.  Is on IV  heparin as well.  In addition his creatinine has gotten worse and is now greater than 2.7 mg percent   His echocardiogram shows new onset of reduced ejection fraction and systolic heart failure  Past Medical History     has a past medical history of Diabetes mellitus ([place]) and History of kidney cancer.   reports that he quit smoking about 21 years ago. His smoking use included cigarettes. He has a 70.00 pack-year smoking history. He has never used smokeless tobacco.  Past Surgical History:  Procedure Laterality Date  . NEPHRECTOMY      Allergies  Allergen Reactions  . Novocain [Procaine] Other (See Comments)    Syncope   . Other     Other reaction(s): passes out     There is no immunization history on file for this patient.  Family History  Problem Relation Age of Onset  . Cancer Mother   . Heart attack Father   . Heart attack Brother      Current Facility-Administered Medications:  .  0.9 %  sodium chloride infusion, 250 mL, Intravenous, PRN, Dixie Dials, MD .  amiodarone (PACERONE) tablet 200 mg, 200 mg, Oral, BID, Dixie Dials, MD .  ascorbic acid (VITAMIN C) tablet 250 mg, 250 mg, Oral, Daily, Dixie Dials, MD, 250 mg at 11/17/19 0924 .  atorvastatin (LIPITOR) tablet 10 mg, 10 mg, Oral, q1800, Dixie Dials, MD, 10 mg at 11/16/19 1739 .  digoxin (LANOXIN) tablet 0.125 mg, 0.125 mg, Oral, Daily, Dixie Dials, MD, 0.125 mg at 11/17/19 0924 .  heparin ADULT  infusion 100 units/mL (25000 units/225m sodium chloride 0.45%), 1,700 Units/hr, Intravenous, Continuous, WLyndee Leo RPH, Last Rate: 17 mL/hr at 11/17/19 0800, 1,700 Units/hr at 11/17/19 0800 .  insulin aspart (novoLOG) injection 0-15 Units, 0-15 Units, Subcutaneous, TID WC, KDixie Dials MD, 2 Units at 11/15/19 1726 .  metoprolol tartrate (LOPRESSOR) tablet 25 mg, 25 mg, Oral, TID, KDixie Dials MD, 25 mg at 11/17/19 0924 .  multivitamin with minerals tablet 1 tablet, 1 tablet, Oral, Daily, KDixie Dials MD, 1 tablet at 11/17/19 0924 .  omega-3 acid ethyl esters (LOVAZA) capsule 1 g, 1 g, Oral, Daily, KDixie Dials MD, 1 g at 11/17/19 0923 .  phenol (CHLORASEPTIC) mouth spray 1 spray, 1 spray, Mouth/Throat, PRN, KDoylene Canard Ajay, MD .  sodium chloride flush (NS) 0.9 % injection 3 mL, 3 mL, Intravenous, Q12H, KDixie Dials MD, 3 mL at 11/16/19 2224 .  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 rate 16, height 6' 1"  (1.854 m), weight 108.6 kg, SpO2 94 %.        Intake/Output Summary (Last 24 hours) at 11/17/2019 1135 Last data filed at 11/17/2019 0800 Gross per 24 hour  Intake 882.8 ml  Output 175 ml  Net 707.8 ml   Filed Weights   11/15/19 1836 11/16/19 0246 11/17/19 0353  Weight: 108.9 kg 107.4 kg 108.6 kg    Examination: General: Well-built somewhat deconditioned male lying in the bed appears comfortable HENT: No elevated JVP.  No neck nodes.  Oral cavity appears normal Lungs: No respiratory distress.  Mild crackles present bilaterally and scattered Cardiovascular: Atrial fibrillation Abdomen: Soft nontender no organomegaly Extremities: Mild pedal edema present otherwise no cyanosis no clubbing Neuro: Alert and oriented x3 speech normal GU: Not examined  Resolved Hospital Problem list   X  Assessment & Plan:  Acute hypoxemic respiratory failure with pulmonary infiltrates-differential diagnosis includes systolic heart failure versus interstitial lung disease  [acute on chronic not otherwise specified is a possibility].  Based on history he is either had onset of cardiac or pulmonary or both problems.  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 KDoylene Canardat the bedside and also the patient and his wife at the bedside.  Best practice:  According to Dr. KDoylene Canard  LABS    PULMONARY No results for input(s): PHART, PCO2ART, PO2ART, HCO3, TCO2, O2SAT in the last 168 hours.  Invalid input(s): PCO2, PO2  CBC  Recent Labs  Lab 11/15/19 1118 11/16/19 0241 11/17/19 0204  HGB 14.5 13.9 13.6  HCT 43.1 42.2 40.1  WBC 12.0* 13.2* 11.8*  PLT 163 103* 149*    COAGULATION Recent Labs  Lab 11/15/19 1700  INR 1.3*    CARDIAC  No results for input(s): TROPONINI in the last 168 hours. No results for input(s): PROBNP in the last 168 hours.   CHEMISTRY Recent Labs  Lab 11/15/19 1118 11/15/19 1118 11/16/19 0241 11/17/19 0204  NA 133*  --  136 134*  K 4.7   < > 5.6* 4.9  CL 104  --  98 104  CO2 16*  --  17* 18*  GLUCOSE 184*  --  123* 151*  BUN 35*  --  44* 55*  CREATININE 1.80*  --  2.75* 2.69*  CALCIUM 8.7*  --  8.7* 8.0*   < > = values in this interval not displayed.   Estimated Creatinine Clearance: 32.6 mL/min (A) (by C-G formula based on SCr of 2.69 mg/dL (H)).   LIVER Recent Labs  Lab 11/15/19 1321  11/15/19 1700  AST 29  --   ALT 61*  --   ALKPHOS 191*  --   BILITOT 1.3*  --   PROT 5.8*  --   ALBUMIN 2.5*  --   INR  --  1.3*     INFECTIOUS Recent Labs  Lab 11/15/19 1322  LATICACIDVEN 1.9     ENDOCRINE CBG (last 3)  Recent Labs    11/16/19 2114 11/17/19 0557 11/17/19 1104  GLUCAP 143* 120* 182*         IMAGING x48h  - image(s) personally visualized  -   highlighted in bold NM PULMONARY VENT AND PERF (V/Q Scan)  Result Date: 11/15/2019 CLINICAL DATA:  Cough, palpitations EXAM: NUCLEAR MEDICINE VENTILATION - PERFUSION LUNG SCAN TECHNIQUE: Ventilation images were obtained in multiple projections using inhaled aerosol Tc-14mDTPA. Perfusion images were obtained in multiple projections after intravenous injection of Tc-963mAA. RADIOPHARMACEUTICALS:  38.5 mCi of Tc-9949mPA aerosol inhalation and 1.6 mCi Tc99m50m IV COMPARISON:  Chest x-ray today FINDINGS: Ventilation: Patchy nonsegmental ventilation defects. Perfusion: Matching patchy nonsegmental perfusion defects. IMPRESSION: Patchy nonsegmental matched ventilation and perfusion defects. No miss matched perfusion defects. Study is low probability for pulmonary embolus. Electronically Signed   By: KeviRolm Baptise.   On: 11/15/2019 18:57   DG Chest Port 1 View  Result Date: 11/17/2019 CLINICAL DATA:  Shortness of breath EXAM: PORTABLE CHEST 1 VIEW COMPARISON:  11/15/2019 FINDINGS: Cardiac shadow is within normal limits. Lungs demonstrate patchy opacities slightly increased when compared with prior exam. These are consistent with atypical pneumonia. No sizable effusion is seen. No bony abnormality is noted. IMPRESSION: Persistent and slightly increased opacities bilaterally consistent with atypical pneumonia. Electronically Signed   By: MarkInez Catalina.   On: 11/17/2019 10:44   DG Chest Port 1 View  Result Date: 11/15/2019 CLINICAL DATA:  73 y32r old male with a history of shortness of breath EXAM: PORTABLE CHEST 1 VIEW  COMPARISON:  10/26/2019, CT chest 10/26/2019 FINDINGS: Cardiomediastinal silhouette unchanged in size and contour. No pneumothorax. Similar appearance of reticulonodular opacities throughout the lungs with no significant change to the x-ray 10/26/2019. No large pleural effusion. No displaced fracture IMPRESSION: Similar appearance of bilateral reticulonodular opacities to the comparison chest x-rays, compatible with COVID pneumonia. Electronically Signed   By: JaimCorrie Mckusick.   On: 11/15/2019 12:48   ECHOCARDIOGRAM COMPLETE  Result Date: 11/16/2019  ECHOCARDIOGRAM REPORT   Patient Name:   KARTEL WOLBERT Date of Exam: 11/16/2019 Medical Rec #:  [id number]     Height:       73.0 in Accession #:    [id number]    Weight:       236.8 lb Date of Birth:  [birth year]     BSA:          2.31 m Patient Age:    89 years      BP:           103/68 mmHg Patient Gender: M             HR:           99 bpm. Exam Location:  Inpatient Procedure: 2D Echo, Cardiac Doppler and Color Doppler Indications:     Atrial Fibrillation 427.31  History:         Patient has no prior history of Echocardiogram examinations.                  Arrythmias:Atrial Fibrillation; Risk Factors:Diabetes and                  Former Smoker.  Sonographer:     Vickie Epley RDCS Referring Phys:  [place] Diagnosing Phys: Dixie Dials MD IMPRESSIONS  1. Left ventricular ejection fraction, by estimation, is 25 to 30%. The left ventricle has severely decreased function. The left ventrical demonstrates global hypokinesis. The left ventricular internal cavity size was mildly dilated. Indeterminate diastolic filling due to E-A fusion. There is severe hypokinesis of the left ventricular, entire anterior wall. There is mild hypokinesis of the left ventricular, entire inferior wall.  2. Right ventricular systolic function is mildly reduced. The right ventricular size is mildly enlarged. There is moderately elevated pulmonary artery systolic pressure.  3. Moderate  mitral valve regurgitation.  4. Tricuspid valve regurgitation is moderate.  5. The aortic valve is tricuspid. Aortic valve regurgitation is mild. Conclusion(s)/Recomendation(s): Findings consistent with dilated cardiomyopathy. FINDINGS  Left Ventricle: Left ventricular ejection fraction, by estimation, is 25 to 30%. The left ventricle has severely decreased function. The left ventricle demonstrates global hypokinesis. Severe hypokinesis of the left ventricular, entire anterior wall. Mild hypokinesis of the left ventricular, entire inferior wall. The left ventricular internal cavity size was mildly dilated. There is no left ventricular hypertrophy.  LV Wall Scoring: The mid and distal anterior wall, apical lateral segment, and apex are akinetic. The antero-lateral wall, entire septum, entire inferior wall, posterior wall, and basal anterior segment are hypokinetic. Right Ventricle: The right ventricular size is mildly enlarged. No increase in right ventricular wall thickness. Right ventricular systolic function is mildly reduced. There is moderately elevated pulmonary artery systolic pressure. The tricuspid regurgitant velocity is 3.00 m/s, and with an assumed right atrial pressure of 15 mmHg, the estimated right ventricular systolic pressure is 36.6 mmHg. Left Atrium: Left atrial size was moderately dilated. Right Atrium: Right atrial size was moderately dilated. Pericardium: There is no evidence of pericardial effusion. Mitral Valve: The mitral valve is normal in structure and function. Moderate mitral valve regurgitation. Tricuspid Valve: The tricuspid valve is normal in structure. Tricuspid valve regurgitation is moderate. Aortic Valve: The aortic valve is tricuspid. . There is mild thickening and mild calcification of the aortic valve. Aortic valve regurgitation is mild. Mild aortic valve annular calcification. There is mild thickening of the aortic valve. There is mild calcification of the aortic valve.  Pulmonic Valve: The pulmonic valve was grossly  normal. Pulmonic valve regurgitation is mild. Aorta: The aortic root and ascending aorta are structurally normal, with no evidence of dilitation. There is mild (Grade II) atheroma plaque involving the ascending aorta. Venous: The inferior vena cava is dilated in size with less than 50% respiratory variability, suggesting right atrial pressure of 15 mmHg. IAS/Shunts: No atrial level shunt detected by color flow Doppler.  LEFT VENTRICLE PLAX 2D LVIDd:         6.31 cm LVIDs:         5.33 cm LV PW:         0.89 cm LV IVS:        0.89 cm LVOT diam:     2.30 cm LV SV:         41.86 ml LV SV Index:   27.24 LVOT Area:     4.15 cm  LV Volumes (MOD) LV area d, A2C:    51.60 cm LV area d, A4C:    46.70 cm LV area s, A2C:    44.70 cm LV area s, A4C:    42.00 cm LV major d, A2C:   9.26 cm LV major d, A4C:   9.29 cm LV major s, A2C:   8.93 cm LV major s, A4C:   9.32 cm LV vol d, MOD A2C: 245.0 ml LV vol d, MOD A4C: 194.0 ml LV vol s, MOD A2C: 187.0 ml LV vol s, MOD A4C: 158.0 ml LV SV MOD A2C:     58.0 ml LV SV MOD A4C:     194.0 ml LV SV MOD BP:      42.7 ml LEFT ATRIUM             Index       RIGHT ATRIUM           Index LA diam:        5.10 cm 2.21 cm/m  RA Area:     25.00 cm LA Vol (A2C):   88.5 ml 38.28 ml/m RA Volume:   82.50 ml  35.69 ml/m LA Vol (A4C):   59.2 ml 25.61 ml/m LA Biplane Vol: 74.5 ml 32.23 ml/m  AORTIC VALVE LVOT Vmax:   57.70 cm/s LVOT Vmean:  41.050 cm/s LVOT VTI:    0.101 m  AORTA Ao Root diam: 3.70 cm TRICUSPID VALVE TR Peak grad:   36.0 mmHg TR Vmax:        300.00 cm/s  SHUNTS Systemic VTI:  0.10 m Systemic Diam: 2.30 cm Dixie Dials MD Electronically signed by Dixie Dials MD Signature Date/Time: 11/16/2019/11:08:05 AM    Final

## 2019-11-18 DIAGNOSIS — J849 Interstitial pulmonary disease, unspecified: Secondary | ICD-10-CM

## 2019-11-18 LAB — BASIC METABOLIC PANEL
Anion gap: 9 (ref 5–15)
BUN: 51 mg/dL — ABNORMAL HIGH (ref 8–23)
CO2: 22 mmol/L (ref 22–32)
Calcium: 8.5 mg/dL — ABNORMAL LOW (ref 8.9–10.3)
Chloride: 105 mmol/L (ref 98–111)
Creatinine, Ser: 2.27 mg/dL — ABNORMAL HIGH (ref 0.61–1.24)
GFR calc Af Amer: 32 mL/min — ABNORMAL LOW (ref >60)
GFR calc non Af Amer: 28 mL/min — ABNORMAL LOW (ref >60)
Glucose, Bld: 165 mg/dL — ABNORMAL HIGH (ref 70–99)
Potassium: 5.3 mmol/L — ABNORMAL HIGH (ref 3.5–5.1)
Sodium: 136 mmol/L (ref 135–145)

## 2019-11-18 LAB — GLOMERULAR BASEMENT MEMBRANE ANTIBODIES: GBM Ab: 4 units (ref 0–20)

## 2019-11-18 LAB — GLUCOSE, CAPILLARY
Glucose-Capillary: 121 mg/dL — ABNORMAL HIGH (ref 70–99)
Glucose-Capillary: 138 mg/dL — ABNORMAL HIGH (ref 70–99)
Glucose-Capillary: 110 mg/dL — ABNORMAL HIGH (ref 70–99)
Glucose-Capillary: 128 mg/dL — ABNORMAL HIGH (ref 70–99)

## 2019-11-18 LAB — CBC
HCT: 42.5 % (ref 39.0–52.0)
Hemoglobin: 13.8 g/dL (ref 13.0–17.0)
MCH: 31.2 pg (ref 26.0–34.0)
MCHC: 32.5 g/dL (ref 30.0–36.0)
MCV: 95.9 fL (ref 80.0–100.0)
Platelets: 199 10*3/uL (ref 150–400)
RBC: 4.43 MIL/uL (ref 4.22–5.81)
RDW: 14.5 % (ref 11.5–15.5)
WBC: 14.9 10*3/uL — ABNORMAL HIGH (ref 4.0–10.5)
nRBC: 0.3 % — ABNORMAL HIGH (ref 0.0–0.2)

## 2019-11-18 LAB — ANTINUCLEAR ANTIBODIES, IFA: ANA Ab, IFA: NEGATIVE

## 2019-11-18 LAB — CYCLIC CITRUL PEPTIDE ANTIBODY, IGG/IGA: CCP Antibodies IgG/IgA: 3 units (ref 0–19)

## 2019-11-18 LAB — HEPARIN LEVEL (UNFRACTIONATED): Heparin Unfractionated: 0.71 IU/mL — ABNORMAL HIGH (ref 0.30–0.70)

## 2019-11-18 LAB — ANTI-SCLERODERMA ANTIBODY: Scleroderma (Scl-70) (ENA) Antibody, IgG: <0.2 AI (ref 0.0–0.9)

## 2019-11-18 LAB — LEGIONELLA PNEUMOPHILA SEROGP 1 UR AG: L. pneumophila Serogp 1 Ur Ag: NEGATIVE

## 2019-11-18 LAB — SJOGRENS SYNDROME-B EXTRACTABLE NUCLEAR ANTIBODY: SSB (La) (ENA) Antibody, IgG: <0.2 AI (ref 0.0–0.9)

## 2019-11-18 LAB — MPO/PR-3 (ANCA) ANTIBODIES
ANCA Proteinase 3: <3.5 U/mL (ref 0.0–3.5)
Myeloperoxidase Abs: <9 U/mL (ref 0.0–9.0)

## 2019-11-18 LAB — RHEUMATOID FACTOR: Rheumatoid fact SerPl-aCnc: <10 IU/mL (ref 0.0–13.9)

## 2019-11-18 LAB — ANTI-DNA ANTIBODY, DOUBLE-STRANDED: ds DNA Ab: 2 IU/mL (ref 0–9)

## 2019-11-18 LAB — SJOGRENS SYNDROME-A EXTRACTABLE NUCLEAR ANTIBODY: SSA (Ro) (ENA) Antibody, IgG: <0.2 AI (ref 0.0–0.9)

## 2019-11-18 MED ORDER — SODIUM ZIRCONIUM CYCLOSILICATE 10 G PO PACK
10.0000 g | PACK | Freq: Once | ORAL | Status: AC
  Administered 2019-11-18: 10 g via ORAL
  Filled 2019-11-18: qty 1

## 2019-11-18 MED ORDER — DILTIAZEM HCL 60 MG PO TABS
30.0000 mg | ORAL_TABLET | Freq: Four times a day (QID) | ORAL | Status: DC
  Administered 2019-11-18 (×3): 30 mg via ORAL
  Filled 2019-11-18 (×4): qty 1

## 2019-11-18 MED ORDER — SODIUM CHLORIDE 0.9 % IV SOLN: INTRAVENOUS | Status: DC

## 2019-11-18 MED ORDER — INSULIN GLARGINE 100 UNIT/ML ~~LOC~~ SOLN
6.0000 [IU] | Freq: Every day | SUBCUTANEOUS | Status: DC
  Administered 2019-11-18 – 2019-11-25 (×8): 6 [IU] via SUBCUTANEOUS
  Filled 2019-11-18 (×10): qty 0.06

## 2019-11-18 NOTE — Progress Notes (Signed)
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  He is also status post nephrectomy for cancer many years ago which apparently was his previous hospitalization [but his creatinine in January 2021 was 1.6 mg percent; no prior creatinine.  Also 70 pack smoking history but quit 20 years ago.  In the early part of January 2021 he suffered from COVID-19 along with his wife.  He thinks he is occupationally exposed.  Review of the chart indicates that on October 23, 2019 he is status post monoclonal antibody infusion against COVID-19.  Then 3 days later on October 26, 2019 he ended up in the emergency department with worsening respiratory symptoms.  D-dimer was high.    Adm with  progressive worsening shortness of breath and fatigue and therefore admitted and found to be in acute hypoxemic respiratory failure requiring a few liters of nasal cannula.  Due to patchy infiltrates that seem worse on the chest x-ray this admission compared to mid January 2021 pulmonary has been consulted particularly in the post COVID-19 setting.   Post admission noticed to have atrial fibrillation and started on amiodarone.  Is on IV heparin as well.  In addition his creatinine has gotten worse and is now greater than 2.7 mg percent   His echocardiogram shows new onset of reduced ejection fraction and systolic heart failure   Significant Hospital Events   11/15/2019 - admit 2/9 - pccm consult  Consults:  2/9 pccm  Procedures:  CT angiogram 1/18 neg pulmonary embolism but did show patchy groundglass opacities   Significant Diagnostic Tests:  Duplex BLE 2/9 >> right popliteal vein  age-indeterminate DVT VQ scan 2/7 low probability HRCT 2/9 patchy GGO, consolidation and septal thickening -GGO decreased compared to 1/18,, small effusions  Micro Data:  11/15/2019 - RVP neg, COVID neg 2/9 - urine strep, urine leg 2.9 - autoimmune, 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 rate 18, height 6' 1"  (1.854 m), weight 108.3 kg, SpO2 91 %.        Intake/Output Summary (Last 24 hours) at 11/18/2019 1455 Last data filed at 11/18/2019 1400 Gross per 24 hour  Intake 359 ml  Output 1500 ml  Net -1141 ml   Filed Weights   11/16/19 0246 11/17/19 0353 11/18/19 0446  Weight: 107.4 kg 108.6 kg 108.3 kg    Examination: General: Well-built man able to sit up in bed, no distress HENT: No elevated JVP.  No neck nodes.  No pallor or icterus Lungs: No accessory muscle use mild crackles right base Cardiovascular: S1-S2 regular Abdomen: Soft nontender no organomegaly Extremities:1+  pedal edema present otherwise no cyanosis no clubbing Neuro: Alert and oriented x3 speech normal GU: Not examined    Resolved Hospital Problem list   X  Assessment & Plan:  Acute hypoxemic respiratory failure with pulmonary  infiltrates-CT personally reviewed, appears to be resolving Covid pneumonia -Unclear whether he has underlying ILD, no prior imaging, doubt this -He does seem to have emphysema and history of smoking heavily in the past 2 to 3 packs/day-history verified from wife   Plan -Hypoxia appears to have resolved, he does not need oxygen -He probably needs PFTs and another follow-up high-resolution CT in 3 months after acute issues resolve to check for underlying  ILD -Autoimmune serology has been sent but clinically he has no evidence of collagen vascular disease -and can be followed as outpatient, he has an appointment with Dr. 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 FCCP. [facility]  If no response to pager , please call [phone]   11/18/2019

## 2019-11-18 NOTE — Progress Notes (Addendum)
[facility] for heparin Indication: atrial fibrillation/new DVT  Allergies  Allergen Reactions  . Novocain [Procaine] Other (See Comments)    Syncope   . Other     Other reaction(s): passes out    Patient Measurements: Height: 6\' 1"  (185.4 cm) Weight: 238 lb 12.1 oz (108.3 kg) IBW/kg (Calculated) : 79.9 Heparin Dosing Weight: 104kg  Vital Signs: Temp: 97.4 F (36.3 C) (02/10 0800) Temp Source: Oral (02/10 0800) BP: 95/68 (02/10 0446) Pulse Rate: 103 (02/10 0952)  Labs: Recent Labs    11/15/19 1118 11/15/19 1118 11/15/19 1321 11/15/19 1700 11/16/19 0241 11/16/19 0241 11/16/19 1015 11/16/19 2020 11/17/19 0204 11/18/19 0209  HGB 14.5   < >  --   --  13.9   < >  --   --  13.6 13.8  HCT 43.1   < >  --   --  42.2  --   --   --  40.1 42.5  PLT 163   < >  --   --  103*  --   --   --  149* 199  APTT  --   --   --  31  --   --   --   --   --   --   LABPROT  --   --   --  16.0*  --   --   --   --   --   --   INR  --   --   --  1.3*  --   --   --   --   --   --   HEPARINUNFRC  --   --   --   --  <0.10*  --    < > 0.47 0.48 0.71*  CREATININE 1.80*   < >  --   --  2.75*  --   --   --  2.69* 2.27*  TROPONINIHS 38*  --  39*  --   --   --   --   --   --   --    < > = values in this interval not displayed.    Estimated Creatinine Clearance: 38.5 mL/min (A) (by C-G formula based on SCr of 2.27 mg/dL (H)).  Assessment: 50 YOM presenting with cough, palpitations, now with AFib w/RVR.  Not on anticoagulation PTA.  Heparin level just above goal this morning on 1700 units/hr, has previously been stable. No bleeding noted, cbc has remained stable.   Dopplers 2/9 positive for right age indeterminate dvt in popliteal vein.   Goal of Therapy:  Heparin level 0.3-0.7 units/ml Monitor platelets by anticoagulation protocol: Yes   Plan:  Reduce heparin to 1600 units/hr Daily heparin level, cbc Follow up transition to oral anticoagulation once  procedures are completed  Erin Hearing PharmD., BCPS Clinical Pharmacist 11/18/2019 11:09 AM

## 2019-11-18 NOTE — Progress Notes (Signed)
Ambulated along the hallway on room air, about 500 feet  tolerated well, denied any discomfort. Sat on a chair after ambulation.

## 2019-11-18 NOTE — Consult Note (Addendum)
[name] Progress Note    Assessment/ Plan:   1.AKI on CKD IIIa, nonoliguric, solitary kidney: Cr 1.8 >2.75 > 2.69 >2.25 today. Baseline 1.4-1.6. Suspect hemodynamically mediated 2/2 poor renal perfusion, FeNa of 0.7% further supports this. Renal U/S without hydronephrosis, right renal cysts present but unlikely contributing to current AKI.  Will continue to monitor for now.   2.  Acute hypoxemic respiratory failure: On 2-3L [place]. CT favoring slowly resolving COVID-19 pneumonia, possible postinflammatory fibrosis.  Plan per pulmonology.  3. 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 Management per cardiology.  Recommend elective coronary angiogram after further improvement in renal function.  4. Mild Hyperkalemia: K 5.3, in setting of heparin ggt. Will give lokelma 10g x1.   5. Hyponatremia: Resolved, Na 136.    6. T2DM: A1c 7.3. Diet controlled, SSI per primary.   7. Right popliteal vein DVT: Indeterminate age.  On IV heparin currently.  Subjective:   No acute events overnight. Net +425ml since admit. Renal function improving, hoping that this was his "get out of jail free card" so that he can go home.   Objective:   BP 95/68 (BP Location: Right Arm)   Pulse 63   Temp (!) 97.4 F (36.3 C) (Oral)   Resp (!) 21   Ht 6\' 1"  (1.854 m)   Wt 108.3 kg   SpO2 94%   BMI 31.50 kg/m   Intake/Output Summary (Last 24 hours) at 11/18/2019 0820 Last data filed at 11/18/2019 0457 Gross per 24 hour  Intake 410 ml  Output 1650 ml  Net -1240 ml   Weight change: -0.3 kg  Physical Exam: General: Older gentleman in no acute distress, resting comfortably HEENT: NCAT Cardiac: Regular rate and rhythm this a.m. Lungs: Bibasilar crackles, breathing comfortably on 2L Abdomen: soft Ext: Warm, dry, trace pitting edema to mid shin bilaterally  Imaging: US RENAL  Result Date: 11/17/2019 CLINICAL DATA:  Left nephrectomy for renal cell carcinoma,  acute renal insufficiency EXAM: RENAL / URINARY TRACT ULTRASOUND COMPLETE COMPARISON:  09/28/2016 FINDINGS: Right Kidney: Renal measurements: 14.7 x 7.5 by 8.8 cm = volume: 509 mL. There is a 7.9 x 6.4 x 7.8 cm simple appearing cyst within the lower pole right kidney. In the lateral interpolar region there is a minimally complex 6.1 x 5.3 x 5.0 cm cyst, with a single septation identified. No solid renal lesions. No hydronephrosis. Echotexture is normal. Left Kidney: Surgically absent.  Unremarkable nephrectomy bed. Bladder: Minimally distended without gross abnormality. Evaluation severely limited. Other: None. IMPRESSION: 1. Right renal cysts as above, increased in size since prior study. If further evaluation is desired, dedicated renal CT or MRI could be considered. 2. Left nephrectomy for history of renal cell carcinoma. Electronically Signed   By: Randa Ngo M.D.   On: 11/17/2019 19:34   CT Chest High Resolution  Result Date: 11/17/2019 CLINICAL DATA:  Inpatient. Respiratory failure. COVID-19 pneumonia in early January 2021. Former smoker. History of nephrectomy for renal cancer. EXAM: CT CHEST WITHOUT CONTRAST TECHNIQUE: Multidetector CT imaging of the chest was performed following the standard protocol without intravenous contrast. High resolution imaging of the lungs, as well as inspiratory and expiratory imaging, was performed. COMPARISON:  10/26/2019 chest CT angiogram. FINDINGS: Cardiovascular: Top-normal heart size. No significant pericardial effusion/thickening. Left anterior descending coronary atherosclerosis. Mildly atherosclerotic nonaneurysmal thoracic aorta. Dilated main pulmonary artery (3.5 cm diameter). Mediastinum/Nodes: No discrete thyroid nodules. Unremarkable esophagus. No pathologically enlarged axillary, mediastinal or hilar lymph nodes, noting  limited sensitivity for the detection of hilar adenopathy on this noncontrast study. Nonenlarged coarsely calcified granulomatous right  hilar nodes are unchanged. Lungs/Pleura: No pneumothorax. Small dependent bilateral pleural effusions with mild passive atelectasis in the dependent lungs. There is patchy peripheral septal thickening, perilobular consolidation and ground-glass opacity throughout both lungs involving all lung lobes. The ground-glass component is decreased in the interval since 10/26/2019. no significant regions traction bronchiectasis or frank honeycombing. Mild paraseptal emphysema. Mild diffuse bronchial wall thickening. No central airway stenoses. No lung masses or significant pulmonary nodules. These findings persist on the prone sequence. Upper abdomen: Cholelithiasis. New small wedge-shaped hypoattenuating region in the spleen suggestive of a small splenic infarct. Musculoskeletal: No aggressive appearing focal osseous lesions. Moderate thoracic spondylosis. IMPRESSION: 1. Patchy peripheral septal thickening, perilobular consolidation and ground-glass opacity throughout both lungs, with decreased ground-glass component since 10/26/2019 chest CT. Favor slowly resolving COVID-19 pneumonia, with early postinflammatory fibrosis not excluded. Consider follow-up high-resolution chest CT study in 6 months, as clinically warranted. 2. Small dependent bilateral pleural effusions with mild passive atelectasis in the dependent lungs. 3. Dilated main pulmonary artery, suggesting pulmonary arterial hypertension. 4. One vessel coronary atherosclerosis. 5. Cholelithiasis. 6. New small wedge-shaped hypoattenuating region in the spleen suggestive of a small splenic infarct. 7. Aortic Atherosclerosis (ICD10-I70.0) and Emphysema (ICD10-J43.9). Electronically Signed   By: Ilona Sorrel M.D.   On: 11/17/2019 18:11   DG Chest Port 1 View  Result Date: 11/17/2019 CLINICAL DATA:  Shortness of breath EXAM: PORTABLE CHEST 1 VIEW COMPARISON:  11/15/2019 FINDINGS: Cardiac shadow is within normal limits. Lungs demonstrate patchy opacities slightly  increased when compared with prior exam. These are consistent with atypical pneumonia. No sizable effusion is seen. No bony abnormality is noted. IMPRESSION: Persistent and slightly increased opacities bilaterally consistent with atypical pneumonia. Electronically Signed   By: Inez Catalina M.D.   On: 11/17/2019 10:44   ECHOCARDIOGRAM COMPLETE  Result Date: 11/16/2019    ECHOCARDIOGRAM REPORT   Patient Name:   Melvin Ramirez Date of Exam: 11/16/2019 Medical Rec #:  [id number]     Height:       73.0 in Accession #:    [id number]    Weight:       236.8 lb Date of Birth:  07-01-1946     BSA:          2.31 m Patient Age:    55 years      BP:           103/68 mmHg Patient Gender: M             HR:           99 bpm. Exam Location:  Inpatient Procedure: 2D Echo, Cardiac Doppler and Color Doppler Indications:     Atrial Fibrillation 427.31  History:         Patient has no prior history of Echocardiogram examinations.                  Arrythmias:Atrial Fibrillation; Risk Factors:Diabetes and                  Former Smoker.  Sonographer:     Vickie Epley RDCS Referring Phys:  [place] Diagnosing Phys: Dixie Dials MD IMPRESSIONS  1. Left ventricular ejection fraction, by estimation, is 25 to 30%. The left ventricle has severely decreased function. The left ventrical demonstrates global hypokinesis. The left ventricular internal cavity size was mildly dilated. Indeterminate diastolic filling due to E-A fusion.  There is severe hypokinesis of the left ventricular, entire anterior wall. There is mild hypokinesis of the left ventricular, entire inferior wall.  2. Right ventricular systolic function is mildly reduced. The right ventricular size is mildly enlarged. There is moderately elevated pulmonary artery systolic pressure.  3. Moderate mitral valve regurgitation.  4. Tricuspid valve regurgitation is moderate.  5. The aortic valve is tricuspid. Aortic valve regurgitation is mild. Conclusion(s)/Recomendation(s): Findings  consistent with dilated cardiomyopathy. FINDINGS  Left Ventricle: Left ventricular ejection fraction, by estimation, is 25 to 30%. The left ventricle has severely decreased function. The left ventricle demonstrates global hypokinesis. Severe hypokinesis of the left ventricular, entire anterior wall. Mild hypokinesis of the left ventricular, entire inferior wall. The left ventricular internal cavity size was mildly dilated. There is no left ventricular hypertrophy.  LV Wall Scoring: The mid and distal anterior wall, apical lateral segment, and apex are akinetic. The antero-lateral wall, entire septum, entire inferior wall, posterior wall, and basal anterior segment are hypokinetic. Right Ventricle: The right ventricular size is mildly enlarged. No increase in right ventricular wall thickness. Right ventricular systolic function is mildly reduced. There is moderately elevated pulmonary artery systolic pressure. The tricuspid regurgitant velocity is 3.00 m/s, and with an assumed right atrial pressure of 15 mmHg, the estimated right ventricular systolic pressure is 58.5 mmHg. Left Atrium: Left atrial size was moderately dilated. Right Atrium: Right atrial size was moderately dilated. Pericardium: There is no evidence of pericardial effusion. Mitral Valve: The mitral valve is normal in structure and function. Moderate mitral valve regurgitation. Tricuspid Valve: The tricuspid valve is normal in structure. Tricuspid valve regurgitation is moderate. Aortic Valve: The aortic valve is tricuspid. . There is mild thickening and mild calcification of the aortic valve. Aortic valve regurgitation is mild. Mild aortic valve annular calcification. There is mild thickening of the aortic valve. There is mild calcification of the aortic valve. Pulmonic Valve: The pulmonic valve was grossly normal. Pulmonic valve regurgitation is mild. Aorta: The aortic root and ascending aorta are structurally normal, with no evidence of dilitation.  There is mild (Grade II) atheroma plaque involving the ascending aorta. Venous: The inferior vena cava is dilated in size with less than 50% respiratory variability, suggesting right atrial pressure of 15 mmHg. IAS/Shunts: No atrial level shunt detected by color flow Doppler.  LEFT VENTRICLE PLAX 2D LVIDd:         6.31 cm LVIDs:         5.33 cm LV PW:         0.89 cm LV IVS:        0.89 cm LVOT diam:     2.30 cm LV SV:         41.86 ml LV SV Index:   27.24 LVOT Area:     4.15 cm  LV Volumes (MOD) LV area d, A2C:    51.60 cm LV area d, A4C:    46.70 cm LV area s, A2C:    44.70 cm LV area s, A4C:    42.00 cm LV major d, A2C:   9.26 cm LV major d, A4C:   9.29 cm LV major s, A2C:   8.93 cm LV major s, A4C:   9.32 cm LV vol d, MOD A2C: 245.0 ml LV vol d, MOD A4C: 194.0 ml LV vol s, MOD A2C: 187.0 ml LV vol s, MOD A4C: 158.0 ml LV SV MOD A2C:     58.0 ml LV SV MOD A4C:  194.0 ml LV SV MOD BP:      42.7 ml LEFT ATRIUM             Index       RIGHT ATRIUM           Index LA diam:        5.10 cm 2.21 cm/m  RA Area:     25.00 cm LA Vol (A2C):   88.5 ml 38.28 ml/m RA Volume:   82.50 ml  35.69 ml/m LA Vol (A4C):   59.2 ml 25.61 ml/m LA Biplane Vol: 74.5 ml 32.23 ml/m  AORTIC VALVE LVOT Vmax:   57.70 cm/s LVOT Vmean:  41.050 cm/s LVOT VTI:    0.101 m  AORTA Ao Root diam: 3.70 cm TRICUSPID VALVE TR Peak grad:   36.0 mmHg TR Vmax:        300.00 cm/s  SHUNTS Systemic VTI:  0.10 m Systemic Diam: 2.30 cm Dixie Dials MD Electronically signed by Dixie Dials MD Signature Date/Time: 11/16/2019/11:08:05 AM    Final    VAS [place] LOWER EXTREMITY VENOUS (DVT)  Result Date: 11/18/2019  Lower Venous DVTStudy Indications: Elevated D Dimer, and Edema.  Performing Technologist: Antonieta Pert RDMS, RVT  Examination Guidelines: A complete evaluation includes B-mode imaging, spectral Doppler, color Doppler, and power Doppler as needed of all accessible portions of each vessel. Bilateral testing is considered an integral part of a  complete examination. Limited examinations for reoccurring indications may be performed as noted. The reflux portion of the exam is performed with the patient in reverse Trendelenburg.  +---------+---------------+---------+-----------+------------+-----------------+ RIGHT    CompressibilityPhasicitySpontaneityProperties  Thrombus Aging    +---------+---------------+---------+-----------+------------+-----------------+ CFV      Full           Yes      Yes                                      +---------+---------------+---------+-----------+------------+-----------------+ SFJ      Full                                                             +---------+---------------+---------+-----------+------------+-----------------+ FV Prox  Full                                                             +---------+---------------+---------+-----------+------------+-----------------+ FV Mid   Full                                                             +---------+---------------+---------+-----------+------------+-----------------+ FV DistalFull                                                             +---------+---------------+---------+-----------+------------+-----------------+  PFV      Full                                                             +---------+---------------+---------+-----------+------------+-----------------+ POP      Partial        Yes      Yes        softly      Age Indeterminate                                             echogenic                     +---------+---------------+---------+-----------+------------+-----------------+ PTV      Full                                                             +---------+---------------+---------+-----------+------------+-----------------+ PERO     Full                                                              +---------+---------------+---------+-----------+------------+-----------------+ Gastroc  Full                                                             +---------+---------------+---------+-----------+------------+-----------------+ GSV      Full                                                             +---------+---------------+---------+-----------+------------+-----------------+   +---------+---------------+---------+-----------+----------+--------------+ LEFT     CompressibilityPhasicitySpontaneityPropertiesThrombus Aging +---------+---------------+---------+-----------+----------+--------------+ CFV      Full           Yes      Yes                                 +---------+---------------+---------+-----------+----------+--------------+ SFJ      Full                                                        +---------+---------------+---------+-----------+----------+--------------+ FV Prox  Full                                                        +---------+---------------+---------+-----------+----------+--------------+  FV Mid   Full                                                        +---------+---------------+---------+-----------+----------+--------------+ FV DistalFull                                                        +---------+---------------+---------+-----------+----------+--------------+ PFV      Full                                                        +---------+---------------+---------+-----------+----------+--------------+ POP      Full           Yes      Yes                                 +---------+---------------+---------+-----------+----------+--------------+ PTV      Full                                                        +---------+---------------+---------+-----------+----------+--------------+ PERO     Full                                                         +---------+---------------+---------+-----------+----------+--------------+ Gastroc  Full                                                        +---------+---------------+---------+-----------+----------+--------------+ GSV      Full                                                        +---------+---------------+---------+-----------+----------+--------------+     Summary: RIGHT: - Findings consistent with age indeterminate deep vein thrombosis involving the right popliteal vein. - No cystic structure found in the popliteal fossa. - All other veins visualized appear fully compressible and demonstrate appropriate Doppler characteristics.  LEFT: - There is no evidence of deep vein thrombosis in the lower extremity.  - No cystic structure found in the popliteal fossa.  *See table(s) above for measurements and observations. Electronically signed by Deitra Mayo MD on 11/18/2019 at 6:50:02 AM.    Final     Labs: BMET Recent Labs  Lab 11/15/19 1118 11/16/19 0241 11/17/19 0204 11/18/19 0209  NA 133* 136 134*  136  K 4.7 5.6* 4.9 5.3*  CL 104 98 104 105  CO2 16* 17* 18* 22  GLUCOSE 184* 123* 151* 165*  BUN 35* 44* 55* 51*  CREATININE 1.80* 2.75* 2.69* 2.27*  CALCIUM 8.7* 8.7* 8.0* 8.5*   CBC Recent Labs  Lab 11/15/19 1118 11/16/19 0241 11/17/19 0204 11/18/19 0209  WBC 12.0* 13.2* 11.8* 14.9*  NEUTROABS  --  11.0*  --   --   HGB 14.5 13.9 13.6 13.8  HCT 43.1 42.2 40.1 42.5  MCV 93.1 94.4 93.5 95.9  PLT 163 103* 149* 199    Medications:    . amiodarone  200 mg Oral BID  . vitamin C  250 mg Oral Daily  . atorvastatin  10 mg Oral q1800  . digoxin  0.125 mg Oral Daily  . insulin aspart  0-15 Units Subcutaneous TID WC  . metoprolol tartrate  25 mg Oral TID  . multivitamin with minerals  1 tablet Oral Daily  . omega-3 acid ethyl esters  1 g Oral Daily  . sodium chloride flush  3 mL Intravenous Q12H     Patriciaann Clan, DO  Family Medicine PGY-2  11/18/2019,  8:20 AM

## 2019-11-18 NOTE — Progress Notes (Signed)
Ref: [name], MD   Subjective:  Respiratory distress continues. Mild cough persist. Some oropharyngeal dysphagia since COVID pneumonia. Creatinine down to 2.27. Renal ultrasound is stable. Right popliteal vein DVT. Patient already on IV heparin. HR 80 to 110 range with frequent VPCs. CT chest is showing some resolution of COVID pneumonia + bilateral pleural effusion and CAD.  Objective:  Vital Signs in the last 24 hours: Temp:  [97.3 F (36.3 C)-98.6 F (37 C)] 97.4 F (36.3 C) (02/10 0800) Pulse Rate:  [42-103] 103 (02/10 0952) Cardiac Rhythm: Atrial fibrillation (02/10 0800) Resp:  [14-31] 21 (02/10 0446) BP: (95-117)/(51-77) 95/68 (02/10 0446) SpO2:  [92 %-97 %] 94 % (02/10 0800) Weight:  [108.3 kg] 108.3 kg (02/10 0446)  Physical Exam: BP Readings from Last 1 Encounters:  11/18/19 95/68     Wt Readings from Last 1 Encounters:  11/18/19 108.3 kg    Weight change: -0.3 kg Body mass index is 31.5 kg/m. HEENT: [place]/AT, Eyes-Blue, PERL, EOMI, Conjunctiva-Pink, Sclera-Non-icteric Neck: No JVD, No bruit, Trachea midline. Lungs:  Clear, Bilateral. Cardiac:  Regular rhythm, normal S1 and S2, no S3. II/VI systolic murmur. Abdomen:  Soft, non-tender. BS present. Extremities:  Trace edema present. No cyanosis. No clubbing. No significant discomfort in right calf or popliteal fossa per patient. CNS: AxOx3, Cranial nerves grossly intact, moves all 4 extremities.  Skin: Warm and dry.   Intake/Output from previous day: 02/09 0701 - 02/10 0700 In: 427 [P.O.:240; I.V.:187] Out: 1650 [Urine:1650]    Lab Results: BMET    Component Value Date/Time   NA 136 11/18/2019 0209   NA 134 (L) 11/17/2019 0204   NA 136 11/16/2019 0241   K 5.3 (H) 11/18/2019 0209   K 4.9 11/17/2019 0204   K 5.6 (H) 11/16/2019 0241   CL 105 11/18/2019 0209   CL 104 11/17/2019 0204   CL 98 11/16/2019 0241   CO2 22 11/18/2019 0209   CO2 18 (L) 11/17/2019 0204   CO2 17 (L) 11/16/2019 0241   GLUCOSE 165 (H) 11/18/2019 0209   GLUCOSE 151 (H) 11/17/2019 0204   GLUCOSE 123 (H) 11/16/2019 0241   BUN 51 (H) 11/18/2019 0209   BUN 55 (H) 11/17/2019 0204   BUN 44 (H) 11/16/2019 0241   CREATININE 2.27 (H) 11/18/2019 0209   CREATININE 2.69 (H) 11/17/2019 0204   CREATININE 2.75 (H) 11/16/2019 0241   CALCIUM 8.5 (L) 11/18/2019 0209   CALCIUM 8.0 (L) 11/17/2019 0204   CALCIUM 8.7 (L) 11/16/2019 0241   GFRNONAA 28 (L) 11/18/2019 0209   GFRNONAA 23 (L) 11/17/2019 0204   GFRNONAA 22 (L) 11/16/2019 0241   GFRAA 32 (L) 11/18/2019 0209   GFRAA 26 (L) 11/17/2019 0204   GFRAA 26 (L) 11/16/2019 0241   CBC    Component Value Date/Time   WBC 14.9 (H) 11/18/2019 0209   RBC 4.43 11/18/2019 0209   HGB 13.8 11/18/2019 0209   HCT 42.5 11/18/2019 0209   PLT 199 11/18/2019 0209   MCV 95.9 11/18/2019 0209   MCH 31.2 11/18/2019 0209   MCHC 32.5 11/18/2019 0209   RDW 14.5 11/18/2019 0209   LYMPHSABS 0.9 11/16/2019 0241   MONOABS 0.8 11/16/2019 0241   EOSABS 0.0 11/16/2019 0241   BASOSABS 0.1 11/16/2019 0241   HEPATIC Function Panel Recent Labs    10/26/19 1913 11/15/19 1321  PROT 6.1* 5.8*   HEMOGLOBIN A1C No components found for: HGA1C,  MPG CARDIAC ENZYMES No results found for: CKTOTAL, CKMB, CKMBINDEX, TROPONINI BNP No results  for input(s): PROBNP in the last 8760 hours. TSH Recent Labs    11/15/19 1700  TSH 1.647   CHOLESTEROL No results for input(s): CHOL in the last 8760 hours.  Scheduled Meds: . vitamin C  250 mg Oral Daily  . atorvastatin  10 mg Oral q1800  . digoxin  0.125 mg Oral Daily  . diltiazem  30 mg Oral QID  . insulin aspart  0-15 Units Subcutaneous TID WC  . insulin glargine  6 Units Subcutaneous QHS  . metoprolol tartrate  25 mg Oral TID  . multivitamin with minerals  1 tablet Oral Daily  . omega-3 acid ethyl esters  1 g Oral Daily  . sodium chloride flush  3 mL Intravenous Q12H  . sodium zirconium cyclosilicate  10 g Oral Once   Continuous  Infusions: . sodium chloride    . heparin 1,700 Units/hr (11/18/19 0503)   PRN Meds:.sodium chloride, phenol, sodium chloride flush  Assessment/Plan: Acute systolic left heart failure Moderate right heart systolic dysfunction Moderate MR and TR Atrial fibrillation, recent, rate controlled, CHA2DS2VASc score of 3, on heparin drip Type 2 DM with hyperglycemia CKD, IV, improving S/P left nephrectomy Obesity Recent COVID pneumonia with pulmonary fibrosis Possible myocarditis CAD Right popliteal DVT  Change amiodarone to diltiazem, start with low dose and divided dose. Possible conversion to Eliquis or warfarin if R + L heart catheterization is postponed after discussion with patient, family and renal doctor.   LOS: 3 days   Time spent including chart review, lab review, examination, discussion with patient : 30 min   Dixie Dials  MD  11/18/2019, 9:56 AM

## 2019-11-19 ENCOUNTER — Other Ambulatory Visit: Payer: Self-pay | Admitting: Physician Assistant

## 2019-11-19 ENCOUNTER — Encounter (HOSPITAL_COMMUNITY): Payer: Self-pay | Admitting: Cardiovascular Disease

## 2019-11-19 ENCOUNTER — Encounter (HOSPITAL_COMMUNITY)
Admission: EM | Disposition: A | Discharge status: Home / Self Care | Payer: Self-pay | Source: Home / Self Care | Attending: Cardiovascular Disease

## 2019-11-19 ENCOUNTER — Inpatient Hospital Stay (HOSPITAL_COMMUNITY): Payer: Commercial Managed Care - PPO | Admitting: Certified Registered Nurse Anesthetist

## 2019-11-19 HISTORY — PX: HEMOSTASIS CLIP PLACEMENT: SHX6857

## 2019-11-19 HISTORY — PX: BIOPSY: SHX5522

## 2019-11-19 HISTORY — PX: ESOPHAGOGASTRODUODENOSCOPY: SHX5428

## 2019-11-19 LAB — PROTIME-INR
INR: 1.8 — ABNORMAL HIGH (ref 0.8–1.2)
INR: 1.6 — ABNORMAL HIGH (ref 0.8–1.2)
Prothrombin Time: 21.2 seconds — ABNORMAL HIGH (ref 11.4–15.2)
Prothrombin Time: 19.4 seconds — ABNORMAL HIGH (ref 11.4–15.2)

## 2019-11-19 LAB — GLUCOSE, CAPILLARY
Glucose-Capillary: 149 mg/dL — ABNORMAL HIGH (ref 70–99)
Glucose-Capillary: 140 mg/dL — ABNORMAL HIGH (ref 70–99)
Glucose-Capillary: 124 mg/dL — ABNORMAL HIGH (ref 70–99)
Glucose-Capillary: 144 mg/dL — ABNORMAL HIGH (ref 70–99)

## 2019-11-19 LAB — CBC
HCT: 32.8 % — ABNORMAL LOW (ref 39.0–52.0)
HCT: 29.1 % — ABNORMAL LOW (ref 39.0–52.0)
Hemoglobin: 10.7 g/dL — ABNORMAL LOW (ref 13.0–17.0)
Hemoglobin: 9.6 g/dL — ABNORMAL LOW (ref 13.0–17.0)
MCH: 31.3 pg (ref 26.0–34.0)
MCH: 31.6 pg (ref 26.0–34.0)
MCHC: 32.6 g/dL (ref 30.0–36.0)
MCHC: 33 g/dL (ref 30.0–36.0)
MCV: 95.9 fL (ref 80.0–100.0)
MCV: 95.7 fL (ref 80.0–100.0)
Platelets: 203 10*3/uL (ref 150–400)
Platelets: 188 10*3/uL (ref 150–400)
RBC: 3.42 MIL/uL — ABNORMAL LOW (ref 4.22–5.81)
RBC: 3.04 MIL/uL — ABNORMAL LOW (ref 4.22–5.81)
RDW: 14.6 % (ref 11.5–15.5)
RDW: 14.6 % (ref 11.5–15.5)
WBC: 17.1 10*3/uL — ABNORMAL HIGH (ref 4.0–10.5)
WBC: 13.3 10*3/uL — ABNORMAL HIGH (ref 4.0–10.5)
nRBC: 1.2 % — ABNORMAL HIGH (ref 0.0–0.2)
nRBC: 1.1 % — ABNORMAL HIGH (ref 0.0–0.2)

## 2019-11-19 LAB — BASIC METABOLIC PANEL
Anion gap: 6 (ref 5–15)
BUN: 60 mg/dL — ABNORMAL HIGH (ref 8–23)
CO2: 21 mmol/L — ABNORMAL LOW (ref 22–32)
Calcium: 7.7 mg/dL — ABNORMAL LOW (ref 8.9–10.3)
Chloride: 111 mmol/L (ref 98–111)
Creatinine, Ser: 2.17 mg/dL — ABNORMAL HIGH (ref 0.61–1.24)
GFR calc Af Amer: 34 mL/min — ABNORMAL LOW (ref >60)
GFR calc non Af Amer: 30 mL/min — ABNORMAL LOW (ref >60)
Glucose, Bld: 166 mg/dL — ABNORMAL HIGH (ref 70–99)
Potassium: 5.6 mmol/L — ABNORMAL HIGH (ref 3.5–5.1)
Sodium: 138 mmol/L (ref 135–145)

## 2019-11-19 LAB — HEMOGLOBIN AND HEMATOCRIT, BLOOD
HCT: 30.9 % — ABNORMAL LOW (ref 39.0–52.0)
Hemoglobin: 10.1 g/dL — ABNORMAL LOW (ref 13.0–17.0)

## 2019-11-19 LAB — ABO/RH: ABO/RH(D): B POS

## 2019-11-19 LAB — APTT: aPTT: 34 seconds (ref 24–36)

## 2019-11-19 LAB — PREPARE RBC (CROSSMATCH)

## 2019-11-19 SURGERY — EGD (ESOPHAGOGASTRODUODENOSCOPY)
Anesthesia: Monitor Anesthesia Care

## 2019-11-19 MED ORDER — SODIUM ZIRCONIUM CYCLOSILICATE 10 G PO PACK
10.0000 g | PACK | Freq: Three times a day (TID) | ORAL | Status: AC
  Administered 2019-11-19: 10 g via ORAL
  Filled 2019-11-19: qty 1

## 2019-11-19 MED ORDER — LIDOCAINE 2% (20 MG/ML) 5 ML SYRINGE
INTRAMUSCULAR | Status: DC | PRN
  Administered 2019-11-19: 40 mg via INTRAVENOUS

## 2019-11-19 MED ORDER — SUCRALFATE 1 GM/10ML PO SUSP
1.0000 g | Freq: Three times a day (TID) | ORAL | Status: DC
  Administered 2019-11-19 – 2019-11-26 (×28): 1 g via ORAL
  Filled 2019-11-19 (×29): qty 10

## 2019-11-19 MED ORDER — PHENYLEPHRINE 40 MCG/ML (10ML) SYRINGE FOR IV PUSH (FOR BLOOD PRESSURE SUPPORT)
PREFILLED_SYRINGE | INTRAVENOUS | Status: DC | PRN
  Administered 2019-11-19: 120 ug via INTRAVENOUS
  Administered 2019-11-19 (×6): 80 ug via INTRAVENOUS

## 2019-11-19 MED ORDER — SODIUM CHLORIDE 0.9 % IV SOLN: INTRAVENOUS | Status: DC | PRN

## 2019-11-19 MED ORDER — PANTOPRAZOLE SODIUM 40 MG IV SOLR
40.0000 mg | INTRAVENOUS | Status: DC
  Administered 2019-11-19 – 2019-11-24 (×6): 40 mg via INTRAVENOUS
  Filled 2019-11-19 (×6): qty 40

## 2019-11-19 MED ORDER — DEXTROSE-NACL 5-0.45 % IV SOLN: INTRAVENOUS | Status: AC

## 2019-11-19 MED ORDER — SODIUM CHLORIDE 0.9% FLUSH: 3.0000 mL | INTRAVENOUS | Status: DC | PRN

## 2019-11-19 MED ORDER — PROPOFOL 500 MG/50ML IV EMUL
INTRAVENOUS | Status: DC | PRN
  Administered 2019-11-19: 75 ug/kg/min via INTRAVENOUS

## 2019-11-19 MED ORDER — SODIUM CHLORIDE 0.9 % IV SOLN: INTRAVENOUS | Status: DC

## 2019-11-19 MED ORDER — SODIUM CHLORIDE 0.9% IV SOLUTION: Freq: Once | INTRAVENOUS | Status: DC

## 2019-11-19 MED ORDER — PROPOFOL 10 MG/ML IV BOLUS
INTRAVENOUS | Status: DC | PRN
  Administered 2019-11-19: 20 mg via INTRAVENOUS

## 2019-11-19 MED ORDER — SODIUM CHLORIDE 0.9 % IV BOLUS: 1000.0000 mL | Freq: Once | INTRAVENOUS | Status: DC | PRN

## 2019-11-19 MED ORDER — SODIUM CHLORIDE 0.9% FLUSH
3.0000 mL | Freq: Two times a day (BID) | INTRAVENOUS | Status: DC
  Administered 2019-11-19 – 2019-11-26 (×12): 3 mL via INTRAVENOUS

## 2019-11-19 NOTE — Anesthesia Preprocedure Evaluation (Signed)
Anesthesia Evaluation  Patient identified by MRN, date of birth, ID band Patient awake    Reviewed: Allergy & Precautions, NPO status , Patient's Chart, lab work & pertinent test results  Airway Mallampati: II  TM Distance: >3 FB Neck ROM: Full    Dental no notable dental hx.    Pulmonary neg pulmonary ROS, former smoker,    Pulmonary exam normal breath sounds clear to auscultation       Cardiovascular negative cardio ROS Normal cardiovascular exam Rhythm:Regular Rate:Normal     Neuro/Psych negative neurological ROS  negative psych ROS   GI/Hepatic negative GI ROS, Neg liver ROS,   Endo/Other  negative endocrine ROSdiabetes  Renal/GU negative Renal ROS  negative genitourinary   Musculoskeletal negative musculoskeletal ROS (+)   Abdominal (+) + obese,   Peds negative pediatric ROS (+)  Hematology negative hematology ROS (+)   Anesthesia Other Findings   Reproductive/Obstetrics negative OB ROS                             Anesthesia Physical Anesthesia Plan  ASA: II  Anesthesia Plan: MAC   Post-op Pain Management:    Induction: Intravenous  PONV Risk Score and Plan: 1 and Treatment may vary due to age or medical condition  Airway Management Planned: Nasal Cannula  Additional Equipment:   Intra-op Plan:   Post-operative Plan:   Informed Consent: I have reviewed the patients History and Physical, chart, labs and discussed the procedure including the risks, benefits and alternatives for the proposed anesthesia with the patient or authorized representative who has indicated his/her understanding and acceptance.     Dental advisory given  Plan Discussed with: CRNA  Anesthesia Plan Comments:         Anesthesia Quick Evaluation

## 2019-11-19 NOTE — Progress Notes (Signed)
Ref: [name], MD   Subjective:  Awake. Moderate respiratory distress at rest. Episodes of non-sustained VT. No chest pain. Active GI bleed. Off IV heparin and on IV Pantoprazole.  Objective:  Vital Signs in the last 24 hours: Temp:  [97.3 F (36.3 C)-97.8 F (36.6 C)] 97.8 F (36.6 C) (02/11 0414) Pulse Rate:  [35-115] 62 (02/11 0432) Cardiac Rhythm: Atrial fibrillation;Ventricular tachycardia (02/11 0058) Resp:  [16-39] 20 (02/11 0432) BP: (63-116)/(44-81) 86/67 (02/11 0432) SpO2:  [86 %-99 %] 99 % (02/11 0432)  Physical Exam: BP Readings from Last 1 Encounters:  11/19/19 (!) 86/67     Wt Readings from Last 1 Encounters:  11/18/19 108.3 kg    Weight change:  Body mass index is 31.5 kg/m. HEENT: [place]/AT, Eyes-Brown, PERL, EOMI, Conjunctiva-Pink, Sclera-Non-icteric Neck: No JVD, No bruit, Trachea midline. Lungs:  Clearing, Bilateral. Cardiac:  Irregular rhythm, normal S1 and S2, no S3. II/VI systolic murmur. Abdomen:  Soft, non-tender. BS present. Extremities:  No edema present. No cyanosis. No clubbing. CNS: AxOx3, Cranial nerves grossly intact, moves all 4 extremities.  Skin: Warm and dry.   Intake/Output from previous day: 02/10 0701 - 02/11 0700 In: 1723.2 [P.O.:270; I.V.:1453.2] Out: 252 [Urine:250; Blood:1]    Lab Results: BMET    Component Value Date/Time   NA 138 11/19/2019 0215   NA 136 11/18/2019 0209   NA 134 (L) 11/17/2019 0204   K 5.6 (H) 11/19/2019 0215   K 5.3 (H) 11/18/2019 0209   K 4.9 11/17/2019 0204   CL 111 11/19/2019 0215   CL 105 11/18/2019 0209   CL 104 11/17/2019 0204   CO2 21 (L) 11/19/2019 0215   CO2 22 11/18/2019 0209   CO2 18 (L) 11/17/2019 0204   GLUCOSE 166 (H) 11/19/2019 0215   GLUCOSE 165 (H) 11/18/2019 0209   GLUCOSE 151 (H) 11/17/2019 0204   BUN 60 (H) 11/19/2019 0215   BUN 51 (H) 11/18/2019 0209   BUN 55 (H) 11/17/2019 0204   CREATININE 2.17 (H) 11/19/2019 0215   CREATININE 2.27 (H) 11/18/2019 0209   CREATININE  2.69 (H) 11/17/2019 0204   CALCIUM 7.7 (L) 11/19/2019 0215   CALCIUM 8.5 (L) 11/18/2019 0209   CALCIUM 8.0 (L) 11/17/2019 0204   GFRNONAA 30 (L) 11/19/2019 0215   GFRNONAA 28 (L) 11/18/2019 0209   GFRNONAA 23 (L) 11/17/2019 0204   GFRAA 34 (L) 11/19/2019 0215   GFRAA 32 (L) 11/18/2019 0209   GFRAA 26 (L) 11/17/2019 0204   CBC    Component Value Date/Time   WBC 13.3 (H) 11/19/2019 0215   RBC 3.04 (L) 11/19/2019 0215   HGB 9.6 (L) 11/19/2019 0215   HCT 29.1 (L) 11/19/2019 0215   PLT 188 11/19/2019 0215   MCV 95.7 11/19/2019 0215   MCH 31.6 11/19/2019 0215   MCHC 33.0 11/19/2019 0215   RDW 14.6 11/19/2019 0215   LYMPHSABS 0.9 11/16/2019 0241   MONOABS 0.8 11/16/2019 0241   EOSABS 0.0 11/16/2019 0241   BASOSABS 0.1 11/16/2019 0241   HEPATIC Function Panel Recent Labs    10/26/19 1913 11/15/19 1321  PROT 6.1* 5.8*   HEMOGLOBIN A1C No components found for: HGA1C,  MPG CARDIAC ENZYMES No results found for: CKTOTAL, CKMB, CKMBINDEX, TROPONINI BNP No results for input(s): PROBNP in the last 8760 hours. TSH Recent Labs    11/15/19 1700  TSH 1.647   CHOLESTEROL No results for input(s): CHOL in the last 8760 hours.  Scheduled Meds: . sodium chloride   Intravenous Once  .  vitamin C  250 mg Oral Daily  . atorvastatin  10 mg Oral q1800  . digoxin  0.125 mg Oral Daily  . diltiazem  30 mg Oral QID  . insulin aspart  0-15 Units Subcutaneous TID WC  . insulin glargine  6 Units Subcutaneous QHS  . metoprolol tartrate  25 mg Oral TID  . multivitamin with minerals  1 tablet Oral Daily  . omega-3 acid ethyl esters  1 g Oral Daily  . pantoprazole (PROTONIX) IV  40 mg Intravenous Q24H  . sodium chloride flush  3 mL Intravenous Q12H   Continuous Infusions: . sodium chloride    . sodium chloride 25 mL/hr at 11/19/19 0300  . 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 IV Pantoprazole. Transfuse 1 unit of PRBC. Patient is not a candidate for invasive cardiac interventions. No anticoagulation for A.fib and DVT for now. GI consult in AM.    LOS: 4 days   Time spent including chart review, lab review, examination, discussion with patient and nurse: 30 min   Dixie Dials  MD  11/19/2019, 5:25 AM

## 2019-11-19 NOTE — Progress Notes (Signed)
Pt with MEWS score 5. MD, pharmacy and RR RN notified. New orders received. VS will be obtained Q15 per protocol. Will continue to monitor pt.

## 2019-11-19 NOTE — Significant Event (Signed)
Rapid Response Event Note  Overview: Called d/t MEWS-5 for HR-115, RR-27, and BP-96/66. Per RN, pt just vomited 300cc maroon colored vomit with clots. Heparin gtt turned off at that time. MD was notified and labs drawn. Pt then had a dark red bowel movement.  Per RN, RRT not needed at this time. RN to initiate RED MEWS VS guidelines, await lab results, and call RRT if assistance needed.   Melvin Ramirez

## 2019-11-19 NOTE — Plan of Care (Signed)
  Problem: Education: Goal: Knowledge of General Education information will improve Description: Including pain rating scale, medication(s)/side effects and non-pharmacologic comfort measures Outcome: Progressing   Problem: Health Behavior/Discharge Planning: Goal: Ability to manage health-related needs will improve Outcome: Progressing   Problem: Clinical Measurements: Goal: Ability to maintain clinical measurements within normal limits will improve Outcome: Progressing Goal: Will remain free from infection Outcome: Progressing Goal: Diagnostic test results will improve Outcome: Progressing Goal: Respiratory complications will improve Outcome: Progressing Goal: Cardiovascular complication will be avoided Outcome: Progressing   Problem: Activity: Goal: Risk for activity intolerance will decrease Outcome: Progressing   Problem: Nutrition: Goal: Adequate nutrition will be maintained Outcome: Progressing   Problem: Coping: Goal: Level of anxiety will decrease Outcome: Progressing   Problem: Elimination: Goal: Will not experience complications related to bowel motility Outcome: Progressing Goal: Will not experience complications related to urinary retention Outcome: Progressing   Problem: Pain Managment: Goal: General experience of comfort will improve Outcome: Progressing   Problem: Safety: Goal: Ability to remain free from injury will improve Outcome: Progressing   Problem: Skin Integrity: Goal: Risk for impaired skin integrity will decrease Outcome: Progressing   Problem: Respiratory: Goal: Will maintain a patent airway Outcome: Progressing Goal: Complications related to the disease process, condition or treatment will be avoided or minimized Outcome: Progressing

## 2019-11-19 NOTE — Transfer of Care (Signed)
Immediate Anesthesia Transfer of Care Note  Patient: Melvin Ramirez  Procedure(s) Performed: ESOPHAGOGASTRODUODENOSCOPY (EGD) (N/A ) BIOPSY HEMOSTASIS CLIP PLACEMENT  Patient Location: Endoscopy Unit  Anesthesia Type:MAC  Level of Consciousness: drowsy  Airway & Oxygen Therapy: Patient Spontanous Breathing and Patient connected to nasal cannula oxygen  Post-op Assessment: Report given to RN and Post -op Vital signs reviewed and stable  Post vital signs: Reviewed  Last Vitals:  Vitals Value Taken Time  BP 101/58 11/19/19 1641  Temp    Pulse 54 11/19/19 1642  Resp 23 11/19/19 1642  SpO2 100 % 11/19/19 1642  Vitals shown include unvalidated device data.  Last Pain:  Vitals:   11/19/19 1530  TempSrc: Oral  PainSc: 2          Complications: No apparent anesthesia complications

## 2019-11-19 NOTE — Consult Note (Signed)
Referring Provider:  Dr. Barb Merino Primary Care Physician:  [name], MD Primary Gastroenterologist:  Dr. Therisa Doyne  Reason for Consultation: Hematemesis  HPI: Melvin Ramirez is a 74 y.o. male who had Covid a month ago and presented to the emergency room with a cough and was found to be in rapid atrial fibrillation several days ago, prompting transfer to the stepdown unit and initiation of anticoagulation with heparin.    After several days of heparin, last night, without warning, he had hematemesis, hematochezia, and almost a 3 g drop in hemoglobin to a current level of 10.1.  Associated with that was a bump in his BUN level, despite improving creatinine, in the setting of chronic renal insufficiency.    Risk factors for upper GI bleeding are absent: Specifically, there is no history of antecedent exposure to ulcerogenic medications, no prior history of upper GI bleeding or ulcer disease, no known liver disease or thrombocytopenia, no prodromal dyspeptic symptoms, reflux, or dysphagia.  No anorexia or weight loss.  As of this time, his heparin has been stopped and he has had no further hematemesis, although he still has had some bloody stools.  He is hemodynamically fairly stable.  He is on pantoprazole 40 mg IV every 24 hours.  Of note, there is a history of diffuse cardiomyopathy with an ejection fraction by echo, on this admission, of 25 to 30%.  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   Past Medical History:  Diagnosis Date  . Diabetes mellitus ([place])   . History of kidney cancer     Past Surgical History:  Procedure Laterality Date  . NEPHRECTOMY      Prior to Admission medications   Medication Sig Start Date End Date Taking? Authorizing Provider  Ascorbic Acid (VITAMIN C) 100 MG tablet as directed   Yes [provider]  atorvastatin (LIPITOR) 10 MG tablet Take 10 mg  by mouth daily. 10/27/19  Yes [provider]  Garlic 384 MG CAPS as directed   Yes [provider]  Glucosamine 500 MG CAPS as directed   Yes [provider]  Multiple Vitamin (MULTIVITAMIN) capsule as directed   Yes [provider]  Omega-3 Fatty Acids (FISH OIL) 1000 MG CAPS Take 1,000 mg by mouth daily.    Yes [provider]  Saw Palmetto 500 MG CAPS as directed   Yes [provider]  tamsulosin (FLOMAX) 0.4 MG CAPS capsule Take 0.4 mg by mouth daily. 10/23/19  Yes [provider]    Current Facility-Administered Medications  Medication Dose Route Frequency Provider Last Rate Last Admin  . 0.9 %  sodium chloride infusion (Manually program via Guardrails IV Fluids)   Intravenous Once Dixie Dials, MD   Stopped at 11/19/19 1234  . ascorbic acid (VITAMIN C) tablet 250 mg  250 mg Oral Daily Dixie Dials, MD   Stopped at 11/19/19 1116  . atorvastatin (LIPITOR) tablet 10 mg  10 mg Oral q1800 Dixie Dials, MD   10 mg at 11/18/19 1709  . dextrose 5 %-0.45 % sodium chloride infusion   Intravenous Continuous Dixie Dials, MD 50 mL/hr at 11/19/19 1137 New Bag at 11/19/19 1137  . insulin aspart (novoLOG) injection 0-15 Units  0-15 Units Subcutaneous TID WC Dixie Dials, MD   2 Units at 11/19/19 1212  . insulin glargine (LANTUS) injection 6 Units  6 Units Subcutaneous  Loretha Brasil, MD   6 Units at 11/18/19 2242  . multivitamin with minerals tablet 1 tablet  1 tablet Oral Daily Dixie Dials, MD   Stopped at 11/19/19 1118  . omega-3 acid ethyl esters (LOVAZA) capsule 1 g  1 g Oral Daily Dixie Dials, MD   Stopped at 11/19/19 1119  . pantoprazole (PROTONIX) injection 40 mg  40 mg Intravenous Q24H Dixie Dials, MD   40 mg at 11/19/19 0100  . phenol (CHLORASEPTIC) mouth spray 1 spray  1 spray Mouth/Throat PRN Dixie Dials, MD      . sodium chloride 0.9 % bolus 1,000 mL  1,000 mL Intravenous Once PRN Dixie Dials, MD      . sodium  chloride flush (NS) 0.9 % injection 3 mL  3 mL Intravenous Q12H Dixie Dials, MD   3 mL at 11/19/19 1322  . sodium chloride flush (NS) 0.9 % injection 3 mL  3 mL Intravenous PRN Dixie Dials, MD      . sodium zirconium cyclosilicate (LOKELMA) packet 10 g  10 g Oral TID Patriciaann Clan, DO   Stopped at 11/19/19 1119    Allergies as of 11/15/2019 - Review Complete 11/15/2019  Allergen Reaction Noted  . Novocain [procaine] Other (See Comments) 10/23/2019  . Other  05/07/2016    Family History  Problem Relation Age of Onset  . Cancer Mother   . Heart attack Father   . Heart attack Brother     Social History   Socioeconomic History  . Marital status: Married    Spouse name: Not on file  . Number of children: Not on file  . Years of education: Not on file  . Highest education level: Not on file  Occupational History  . Occupation: truck Geophysicist/field seismologist  Tobacco Use  . Smoking status: Former Smoker    Packs/day: 2.00    Years: 35.00    Pack years: 70.00    Types: Cigarettes    Quit date: 2000    Years since quitting: 21.1  . Smokeless tobacco: Never Used  Substance and Sexual Activity  . Alcohol use: Yes    Comment: Socially  . Drug use: No  . Sexual activity: Not on file  Other Topics Concern  . Not on file  Social History Narrative   Lives with wife in a one story home.  Has 3 children.     Works as a Administrator.     Education: 12th grade.   Social Determinants of Health   Financial Resource Strain:   . Difficulty of Paying Living Expenses: Not on file  Food Insecurity:   . Worried About Charity fundraiser in the Last Year: Not on file  . Ran Out of Food in the Last Year: Not on file  Transportation Needs:   . Lack of Transportation (Medical): Not on file  . Lack of Transportation (Non-Medical): Not on file  Physical Activity:   . Days of Exercise per Week: Not on file  . Minutes of Exercise per Session: Not on file  Stress:   . Feeling of Stress : Not on file   Social Connections:   . Frequency of Communication with Friends and Family: Not on file  . Frequency of Social Gatherings with Friends and Family: Not on file  . Attends Religious Services: Not on file  . Active Member of Clubs or Organizations: Not on file  . Attends Archivist Meetings: Not on file  . Marital Status: Not  on file  Intimate Partner Violence:   . Fear of Current or Ex-Partner: Not on file  . Emotionally Abused: Not on file  . Physically Abused: Not on file  . Sexually Abused: Not on file    Review of Systems: Negative for chronic dysphagia or other upper GI symptoms as per HPI, negative for constipation, diarrhea, or rectal bleeding at baseline; negative for chest pain or dyspnea  Physical Exam: Vital signs in last 24 hours: Temp:  [97.3 F (36.3 C)-98.6 F (37 C)] 97.9 F (36.6 C) (02/11 1118) Pulse Rate:  [35-115] 112 (02/11 1313) Resp:  [15-39] 24 (02/11 1313) BP: (63-124)/(42-81) 107/70 (02/11 1313) SpO2:  [86 %-99 %] 96 % (02/11 1313) Weight:  [110.8 kg] 110.8 kg (02/11 5462) Last BM Date: 11/18/19  Slightly dusky complexion consistent with previous smoking history (current O2 saturation 100% on nasal cannula).  Lying in bed, flat, no distress, fully alert.  No overt pallor.  Vital signs satisfactory.  Does not look at all shocky.  Chest is clear.  Heart without gallop or murmur appreciated, but irregularly irregular rate consistent with atrial fibrillation, rate approximately 100.  Abdomen is without obvious hepatosplenomegaly, ascites, mass-effect, or tenderness.  No peripheral edema.  No evident focal neurologic deficits.  Mental status intact.  Wife is at bedside  Intake/Output from previous day: 02/10 0701 - 02/11 0700 In: 1723.2 [P.O.:270; I.V.:1453.2] Out: 255 [Urine:250; Emesis/NG output:1; [place]; Blood:1] Intake/Output this shift: Total I/O In: 407.5 [Blood:407.5] Out: 350 [Urine:350]  Lab Results: Recent Labs    11/18/19 0209  11/18/19 0209 11/19/19 0109 11/19/19 0215 11/19/19 1016  WBC 14.9*  --  17.1* 13.3*  --   HGB 13.8   < > 10.7* 9.6* 10.1*  HCT 42.5   < > 32.8* 29.1* 30.9*  PLT 199  --  203 188  --    < > = values in this interval not displayed.   BMET Recent Labs    11/17/19 0204 11/18/19 0209 11/19/19 0215  NA 134* 136 138  K 4.9 5.3* 5.6*  CL 104 105 111  CO2 18* 22 21*  GLUCOSE 151* 165* 166*  BUN 55* 51* 60*  CREATININE 2.69* 2.27* 2.17*  CALCIUM 8.0* 8.5* 7.7*   LFT No results for input(s): PROT, ALBUMIN, AST, ALT, ALKPHOS, BILITOT, BILIDIR, IBILI in the last 72 hours. PT/INR Recent Labs    11/19/19 0109 11/19/19 1016  LABPROT 21.2* 19.4*  INR 1.8* 1.6*    Studies/Results: US RENAL  Result Date: 11/17/2019 CLINICAL DATA:  Left nephrectomy for renal cell carcinoma, acute renal insufficiency EXAM: RENAL / URINARY TRACT ULTRASOUND COMPLETE COMPARISON:  09/28/2016 FINDINGS: Right Kidney: Renal measurements: 14.7 x 7.5 by 8.8 cm = volume: 509 mL. There is a 7.9 x 6.4 x 7.8 cm simple appearing cyst within the lower pole right kidney. In the lateral interpolar region there is a minimally complex 6.1 x 5.3 x 5.0 cm cyst, with a single septation identified. No solid renal lesions. No hydronephrosis. Echotexture is normal. Left Kidney: Surgically absent.  Unremarkable nephrectomy bed. Bladder: Minimally distended without gross abnormality. Evaluation severely limited. Other: None. IMPRESSION: 1. Right renal cysts as above, increased in size since prior study. If further evaluation is desired, dedicated renal CT or MRI could be considered. 2. Left nephrectomy for history of renal cell carcinoma. Electronically Signed   By: Randa Ngo M.D.   On: 11/17/2019 19:34   VAS [place] LOWER EXTREMITY VENOUS (DVT)  Result Date: 11/18/2019  Lower Venous  DVTStudy Indications: Elevated D Dimer, and Edema.  Performing Technologist: Antonieta Pert RDMS, RVT  Examination Guidelines: A complete evaluation  includes B-mode imaging, spectral Doppler, color Doppler, and power Doppler as needed of all accessible portions of each vessel. Bilateral testing is considered an integral part of a complete examination. Limited examinations for reoccurring indications may be performed as noted. The reflux portion of the exam is performed with the patient in reverse Trendelenburg.  +---------+---------------+---------+-----------+------------+-----------------+ RIGHT    CompressibilityPhasicitySpontaneityProperties  Thrombus Aging    +---------+---------------+---------+-----------+------------+-----------------+ CFV      Full           Yes      Yes                                      +---------+---------------+---------+-----------+------------+-----------------+ SFJ      Full                                                             +---------+---------------+---------+-----------+------------+-----------------+ FV Prox  Full                                                             +---------+---------------+---------+-----------+------------+-----------------+ FV Mid   Full                                                             +---------+---------------+---------+-----------+------------+-----------------+ FV DistalFull                                                             +---------+---------------+---------+-----------+------------+-----------------+ PFV      Full                                                             +---------+---------------+---------+-----------+------------+-----------------+ POP      Partial        Yes      Yes        softly      Age Indeterminate                                             echogenic                     +---------+---------------+---------+-----------+------------+-----------------+ PTV      Full                                                              +---------+---------------+---------+-----------+------------+-----------------+  PERO     Full                                                             +---------+---------------+---------+-----------+------------+-----------------+ Gastroc  Full                                                             +---------+---------------+---------+-----------+------------+-----------------+ GSV      Full                                                             +---------+---------------+---------+-----------+------------+-----------------+   +---------+---------------+---------+-----------+----------+--------------+ LEFT     CompressibilityPhasicitySpontaneityPropertiesThrombus Aging +---------+---------------+---------+-----------+----------+--------------+ CFV      Full           Yes      Yes                                 +---------+---------------+---------+-----------+----------+--------------+ SFJ      Full                                                        +---------+---------------+---------+-----------+----------+--------------+ FV Prox  Full                                                        +---------+---------------+---------+-----------+----------+--------------+ FV Mid   Full                                                        +---------+---------------+---------+-----------+----------+--------------+ FV DistalFull                                                        +---------+---------------+---------+-----------+----------+--------------+ PFV      Full                                                        +---------+---------------+---------+-----------+----------+--------------+ POP      Full           Yes      Yes                                 +---------+---------------+---------+-----------+----------+--------------+  PTV      Full                                                         +---------+---------------+---------+-----------+----------+--------------+ PERO     Full                                                        +---------+---------------+---------+-----------+----------+--------------+ Gastroc  Full                                                        +---------+---------------+---------+-----------+----------+--------------+ GSV      Full                                                        +---------+---------------+---------+-----------+----------+--------------+     Summary: RIGHT: - Findings consistent with age indeterminate deep vein thrombosis involving the right popliteal vein. - No cystic structure found in the popliteal fossa. - All other veins visualized appear fully compressible and demonstrate appropriate Doppler characteristics.  LEFT: - There is no evidence of deep vein thrombosis in the lower extremity.  - No cystic structure found in the popliteal fossa.  *See table(s) above for measurements and observations. Electronically signed by Deitra Mayo MD on 11/18/2019 at 6:50:02 AM.    Final     Impression: 1.  Acute upper GI bleeding, occurred while on heparin infusion, indeterminate etiology, currently apparently quiescent 2.  Posthemorrhagic anemia, acute, moderate 3.  Atrial fibrillation, new onset, rate fairly well controlled on diltiazem 4.  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  Popliteal DVT of indeterminate age  Plan: Proceed endoscopic evaluation this afternoon.  Petra Kuba, purpose, risks reviewed with patient and wife and they are agreeable.  They understand that because of his acute medical illness, it is a somewhat higher risk procedure then when done on an elective basis, but overall, I do feel he is stable to undergo it.  Further management will depend on the endoscopic findings.  Continue pantoprazole in the meantime.   LOS: 4 days   Youlanda Mighty Kurstyn Larios  11/19/2019, 1:24 PM   Pager [phone] If no answer or  after 5 PM call [phone]

## 2019-11-19 NOTE — Anesthesia Procedure Notes (Signed)
Procedure Name: MAC Date/Time: 11/19/2019 4:09 PM Performed by: Janene Harvey, CRNA Pre-anesthesia Checklist: Patient identified, Emergency Drugs available, Suction available and Patient being monitored Oxygen Delivery Method: Nasal cannula Dental Injury: Teeth and Oropharynx as per pre-operative assessment

## 2019-11-19 NOTE — Progress Notes (Signed)
Patient's endoscopy was well-tolerated.  Please see report for details.  Findings have been discussed with the patient's wife and with Dr. Doylene Canard.  Pertinent findings: No active bleeding, but fairly large gastric ulcer with stigmata of recent hemorrhage, successfully clipped  Plan:  1.  Add sucralfate to his regimen (ordered) 2.  Discussed with Dr. Doylene Canard appropriate timing of resumption of anticoagulation.  We settled on resuming anticoagulation 3 days from now, which we hope will give sufficient time for initial epithelialization of his ulcer with medical management (thereby reducing risk of recurrent bleeding), while at the same time not causing excessive delay in him deriving the benefit of anticoagulation  Melvin Ramirez, M.D. Pager [phone] If no answer or after 5 PM call [phone]

## 2019-11-19 NOTE — Significant Event (Addendum)
Rapid Response Event Note  Overview: Called originally because MEWS-5 and pt had approx 300cc bloody emesis and a black tarry stool. MD had already been notified, heparin gtt turned off,  and labs ordered. Plan was to monitor and await lab values, BP-96/66 at that time. Called at 0129 d/t hypotension-SBP-63/44. RN was asked to start 1L [place] bolus. Time Called: 0129 Arrival Time: 0134 Event Type: Hypotension, Other (Comment)(GI Bleed)  Initial Focused Assessment: Pt laying on his side in the bed, alert and oriented, denies chest pain. Pt says he is a little SOB but only because of the vomiting. Lungs clear t/o, bowel sounds hypoactive, abd soft/mildly distended, skin warm and dry.   Labs resulted: HGB-13.8>10.7, INR-1.8  Interventions: 1L [place] bolus-BP increased to 91/56 Type and Screen PTT EKG-Afib with RVR with PVCs Plan of Care (if not transferred): Continue to monitor pt closely. Call RRT if further assistance needed.  Event Summary:   at  [facility] notified by primary RN at 0150    at  Outcome: stayed and stabilized        [place], Carren Rang

## 2019-11-19 NOTE — Anesthesia Postprocedure Evaluation (Signed)
Anesthesia Post Note  Patient: Melvin Ramirez  Procedure(s) Performed: ESOPHAGOGASTRODUODENOSCOPY (EGD) (N/A ) [place]     Patient location during evaluation: Endoscopy Anesthesia Type: MAC Level of consciousness: awake and alert Pain management: pain level controlled Vital Signs Assessment: post-procedure vital signs reviewed and stable Respiratory status: spontaneous breathing, nonlabored ventilation and respiratory function stable Cardiovascular status: blood pressure returned to baseline and stable Postop Assessment: no apparent nausea or vomiting Anesthetic complications: no    Last Vitals:  Vitals:   11/19/19 1701 11/19/19 1715  BP: 96/63 (!) 107/50  Pulse: (!) 116 (!) 114  Resp: (!) 29 (!) 24  Temp:    SpO2: 99%     Last Pain:  Vitals:   11/19/19 1715  TempSrc:   PainSc: 0-No pain                 Lynda Rainwater

## 2019-11-19 NOTE — Progress Notes (Signed)
[facility] Progress Note    Assessment/ Plan:   1.AKI on CKD IIIa, nonoliguric, solitary kidney: Cr 1.8 >2.75 >>2.25>2.17 today. Baseline 1.4-1.6. Suspect hemodynamically mediated 2/2 poor renal perfusion. Will continue to monitor for now, especially given acute bleed with subsequent hypotension overnight. Agree with light IV hydration, set end time for 10 hours.   2. Acute GI bleed w/ hematemesis: Hgb 13.8>9.6, s/p 1 U pRBCs. Heparin ggt held, IV PPI and GI consulted per primary.   3. Acute hypoxemic respiratory failure: On 2L [place]. Favoring resolving COVID pna. O/p f/u with PCCM.   4. 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 Management per cardiology.    5. Mild Hyperkalemia: K 5.3>5.6, in setting of GI bleed. Will give lokelma 10g TID today.   6. T2DM: A1c 7.3. Diet controlled, SSI per primary.   7. Right popliteal vein DVT: Indeterminate age.  Holding IV heparin due to above.  Subjective:   New onset coffee ground emesis with hematochezia yesterday evening. IV heparin stopped, IV PPI on and GI consulted. SBP 70-90's consistently, now on light hydration IV (NS 25 ml/hr). Given 1 U prbcs. Doing okay this morning, states he is feeling quite fatigued.    Objective:   BP 124/63   Pulse 94   Temp 97.6 F (36.4 C)   Resp (!) 27   Ht 6\' 1"  (1.854 m)   Wt 110.8 kg   SpO2 94%   BMI 32.23 kg/m   Intake/Output Summary (Last 24 hours) at 11/19/2019 2876 Last data filed at 11/19/2019 8115 Gross per 24 hour  Intake 1689.23 ml  Output 255 ml  Net 1434.23 ml   Weight change: 2.5 kg  Physical Exam: General: Alert, ill appearing gentleman in NAD  HEENT: NCAT Cardiac: irregularly irregular, tachycardic  Lungs: Clear bilaterally w/ exception of bibasilar crackles, mild tachypnea on 2L  Abdomen: soft, non-tender Msk: Moves all extremities spontaneously  Ext: Warm, dry,  1+ pitting edema to midshin b/l    Imaging: US RENAL  Result  Date: 11/17/2019 CLINICAL DATA:  Left nephrectomy for renal cell carcinoma, acute renal insufficiency EXAM: RENAL / URINARY TRACT ULTRASOUND COMPLETE COMPARISON:  09/28/2016 FINDINGS: Right Kidney: Renal measurements: 14.7 x 7.5 by 8.8 cm = volume: 509 mL. There is a 7.9 x 6.4 x 7.8 cm simple appearing cyst within the lower pole right kidney. In the lateral interpolar region there is a minimally complex 6.1 x 5.3 x 5.0 cm cyst, with a single septation identified. No solid renal lesions. No hydronephrosis. Echotexture is normal. Left Kidney: Surgically absent.  Unremarkable nephrectomy bed. Bladder: Minimally distended without gross abnormality. Evaluation severely limited. Other: None. IMPRESSION: 1. Right renal cysts as above, increased in size since prior study. If further evaluation is desired, dedicated renal CT or MRI could be considered. 2. Left nephrectomy for history of renal cell carcinoma. Electronically Signed   By: Randa Ngo M.D.   On: 11/17/2019 19:34   CT Chest High Resolution  Result Date: 11/17/2019 CLINICAL DATA:  Inpatient. Respiratory failure. COVID-19 pneumonia in early January 2021. Former smoker. History of nephrectomy for renal cancer. EXAM: CT CHEST WITHOUT CONTRAST TECHNIQUE: Multidetector CT imaging of the chest was performed following the standard protocol without intravenous contrast. High resolution imaging of the lungs, as well as inspiratory and expiratory imaging, was performed. COMPARISON:  10/26/2019 chest CT angiogram. FINDINGS: Cardiovascular: Top-normal heart size. No significant pericardial effusion/thickening. Left anterior descending coronary atherosclerosis. Mildly atherosclerotic nonaneurysmal thoracic aorta. Dilated  main pulmonary artery (3.5 cm diameter). Mediastinum/Nodes: No discrete thyroid nodules. Unremarkable esophagus. No pathologically enlarged axillary, mediastinal or hilar lymph nodes, noting limited sensitivity for the detection of hilar adenopathy on  this noncontrast study. Nonenlarged coarsely calcified granulomatous right hilar nodes are unchanged. Lungs/Pleura: No pneumothorax. Small dependent bilateral pleural effusions with mild passive atelectasis in the dependent lungs. There is patchy peripheral septal thickening, perilobular consolidation and ground-glass opacity throughout both lungs involving all lung lobes. The ground-glass component is decreased in the interval since 10/26/2019. no significant regions traction bronchiectasis or frank honeycombing. Mild paraseptal emphysema. Mild diffuse bronchial wall thickening. No central airway stenoses. No lung masses or significant pulmonary nodules. These findings persist on the prone sequence. Upper abdomen: Cholelithiasis. New small wedge-shaped hypoattenuating region in the spleen suggestive of a small splenic infarct. Musculoskeletal: No aggressive appearing focal osseous lesions. Moderate thoracic spondylosis. IMPRESSION: 1. Patchy peripheral septal thickening, perilobular consolidation and ground-glass opacity throughout both lungs, with decreased ground-glass component since 10/26/2019 chest CT. Favor slowly resolving COVID-19 pneumonia, with early postinflammatory fibrosis not excluded. Consider follow-up high-resolution chest CT study in 6 months, as clinically warranted. 2. Small dependent bilateral pleural effusions with mild passive atelectasis in the dependent lungs. 3. Dilated main pulmonary artery, suggesting pulmonary arterial hypertension. 4. One vessel coronary atherosclerosis. 5. Cholelithiasis. 6. New small wedge-shaped hypoattenuating region in the spleen suggestive of a small splenic infarct. 7. Aortic Atherosclerosis (ICD10-I70.0) and Emphysema (ICD10-J43.9). Electronically Signed   By: Ilona Sorrel M.D.   On: 11/17/2019 18:11   DG Chest Port 1 View  Result Date: 11/17/2019 CLINICAL DATA:  Shortness of breath EXAM: PORTABLE CHEST 1 VIEW COMPARISON:  11/15/2019 FINDINGS: Cardiac shadow  is within normal limits. Lungs demonstrate patchy opacities slightly increased when compared with prior exam. These are consistent with atypical pneumonia. No sizable effusion is seen. No bony abnormality is noted. IMPRESSION: Persistent and slightly increased opacities bilaterally consistent with atypical pneumonia. Electronically Signed   By: Inez Catalina M.D.   On: 11/17/2019 10:44   VAS [place] LOWER EXTREMITY VENOUS (DVT)  Result Date: 11/18/2019  Lower Venous DVTStudy Indications: Elevated D Dimer, and Edema.  Performing Technologist: Antonieta Pert RDMS, RVT  Examination Guidelines: A complete evaluation includes B-mode imaging, spectral Doppler, color Doppler, and power Doppler as needed of all accessible portions of each vessel. Bilateral testing is considered an integral part of a complete examination. Limited examinations for reoccurring indications may be performed as noted. The reflux portion of the exam is performed with the patient in reverse Trendelenburg.  +---------+---------------+---------+-----------+------------+-----------------+ RIGHT    CompressibilityPhasicitySpontaneityProperties  Thrombus Aging    +---------+---------------+---------+-----------+------------+-----------------+ CFV      Full           Yes      Yes                                      +---------+---------------+---------+-----------+------------+-----------------+ SFJ      Full                                                             +---------+---------------+---------+-----------+------------+-----------------+ FV Prox  Full                                                             +---------+---------------+---------+-----------+------------+-----------------+  FV Mid   Full                                                             +---------+---------------+---------+-----------+------------+-----------------+ FV DistalFull                                                              +---------+---------------+---------+-----------+------------+-----------------+ PFV      Full                                                             +---------+---------------+---------+-----------+------------+-----------------+ POP      Partial        Yes      Yes        softly      Age Indeterminate                                             echogenic                     +---------+---------------+---------+-----------+------------+-----------------+ PTV      Full                                                             +---------+---------------+---------+-----------+------------+-----------------+ PERO     Full                                                             +---------+---------------+---------+-----------+------------+-----------------+ Gastroc  Full                                                             +---------+---------------+---------+-----------+------------+-----------------+ GSV      Full                                                             +---------+---------------+---------+-----------+------------+-----------------+   +---------+---------------+---------+-----------+----------+--------------+ LEFT     CompressibilityPhasicitySpontaneityPropertiesThrombus Aging +---------+---------------+---------+-----------+----------+--------------+ CFV      Full           Yes  Yes                                 +---------+---------------+---------+-----------+----------+--------------+ SFJ      Full                                                        +---------+---------------+---------+-----------+----------+--------------+ FV Prox  Full                                                        +---------+---------------+---------+-----------+----------+--------------+ FV Mid   Full                                                         +---------+---------------+---------+-----------+----------+--------------+ FV DistalFull                                                        +---------+---------------+---------+-----------+----------+--------------+ PFV      Full                                                        +---------+---------------+---------+-----------+----------+--------------+ POP      Full           Yes      Yes                                 +---------+---------------+---------+-----------+----------+--------------+ PTV      Full                                                        +---------+---------------+---------+-----------+----------+--------------+ PERO     Full                                                        +---------+---------------+---------+-----------+----------+--------------+ Gastroc  Full                                                        +---------+---------------+---------+-----------+----------+--------------+ GSV      Full                                                        +---------+---------------+---------+-----------+----------+--------------+  Summary: RIGHT: - Findings consistent with age indeterminate deep vein thrombosis involving the right popliteal vein. - No cystic structure found in the popliteal fossa. - All other veins visualized appear fully compressible and demonstrate appropriate Doppler characteristics.  LEFT: - There is no evidence of deep vein thrombosis in the lower extremity.  - No cystic structure found in the popliteal fossa.  *See table(s) above for measurements and observations. Electronically signed by Deitra Mayo MD on 11/18/2019 at 6:50:02 AM.    Final     Labs: BMET Recent Labs  Lab 11/15/19 1118 11/16/19 0241 11/17/19 0204 11/18/19 0209 11/19/19 0215  NA 133* 136 134* 136 138  K 4.7 5.6* 4.9 5.3* 5.6*  CL 104 98 104 105 111  CO2 16* 17* 18* 22 21*  GLUCOSE 184* 123* 151* 165* 166*   BUN 35* 44* 55* 51* 60*  CREATININE 1.80* 2.75* 2.69* 2.27* 2.17*  CALCIUM 8.7* 8.7* 8.0* 8.5* 7.7*   CBC Recent Labs  Lab 11/16/19 0241 11/16/19 0241 11/17/19 0204 11/18/19 0209 11/19/19 0109 11/19/19 0215  WBC 13.2*   < > 11.8* 14.9* 17.1* 13.3*  NEUTROABS 11.0*  --   --   --   --   --   HGB 13.9   < > 13.6 13.8 10.7* 9.6*  HCT 42.2   < > 40.1 42.5 32.8* 29.1*  MCV 94.4   < > 93.5 95.9 95.9 95.7  PLT 103*   < > 149* 199 203 188   < > = values in this interval not displayed.    Medications:    . sodium chloride   Intravenous Once  . vitamin C  250 mg Oral Daily  . atorvastatin  10 mg Oral q1800  . digoxin  0.125 mg Oral Daily  . diltiazem  30 mg Oral QID  . insulin aspart  0-15 Units Subcutaneous TID WC  . insulin glargine  6 Units Subcutaneous QHS  . metoprolol tartrate  25 mg Oral TID  . multivitamin with minerals  1 tablet Oral Daily  . omega-3 acid ethyl esters  1 g Oral Daily  . pantoprazole (PROTONIX) IV  40 mg Intravenous Q24H  . sodium chloride flush  3 mL Intravenous Q12H     Patriciaann Clan, DO  Family Medicine PGY-2  11/19/2019, 9:22 AM

## 2019-11-19 NOTE — Op Note (Signed)
[facility] Patient Name: Melvin Ramirez Procedure Date : 11/19/2019 MRN: [id number] Attending MD: Ronald Lobo , MD Date of Birth: [birth year] CSN: [id number] Age: 74 Admit Type: Inpatient Procedure:                Upper GI endoscopy Indications:              Acute post hemorrhagic anemia, Hematemesis,                            Hematochezia after being started on heparin for a.                            fib. Providers:                Ronald Lobo, MD, Jeanella Cara, RN, Janeece Agee, Technician, Reather Laurence, CRNA Referring MD:              Medicines:                Monitored Anesthesia Care Complications:            No immediate complications. Estimated Blood Loss:     Estimated blood loss: none. Procedure:                Pre-Anesthesia Assessment:                           - Prior to the procedure, a History and Physical                            was performed, and patient medications and                            allergies were reviewed. The patient's tolerance of                            previous anesthesia was also reviewed. The risks                            and benefits of the procedure and the sedation                            options and risks were discussed with the patient.                            All questions were answered, and informed consent                            was obtained. Prior Anticoagulants: The patient has                            taken heparin, last dose was 1 day prior to  procedure. ASA Grade Assessment: II - A patient                            with mild systemic disease. After reviewing the                            risks and benefits, the patient was deemed in                            satisfactory condition to undergo the procedure.                           After obtaining informed consent, the endoscope was                            passed under  direct vision. Throughout the                            procedure, the patient's blood pressure, pulse, and                            oxygen saturations were monitored continuously. The                            [id number]-H190 ([id number]) Olympus gastroscope was                            introduced through the mouth, and advanced to the                            second part of duodenum. The upper GI endoscopy was                            accomplished without difficulty. The patient                            tolerated the procedure well. Scope In: Scope Out: Findings:      The larynx was normal.      The examined esophagus was normal.      One non-bleeding cratered gastric ulcer with pigmented material was       found on the anterior wall of the gastric antrum. The lesion was 15 mm       in largest dimension. To prevent bleeding, one hemostatic clip was       successfully placed, closing the ulcer defect. There was no bleeding       during, or at the end, of the procedure.      Two non-bleeding superficial gastric ulcers with pigmented material were       found at the incisura. The largest lesion was 5 mm in largest dimension.      Biopsies were taken with a cold forceps from the antrum for histology to       check for H. pylori infection, since the patient has no h/o ASA/NSAID       exposure. Estimated blood loss was minimal.  The exam of the stomach was otherwise normal.      The cardia and gastric fundus were normal on retroflexion.      Hematin (altered blood/coffee-ground-like material) was found adherent       to the mucosa in the entire examined stomach, but no free blood, clots,       or active bleeding were observed.      The examined duodenum was normal. Impression:               - Normal larynx.                           - Normal esophagus.                           - Non-bleeding deep antral gastric ulcer with                            pigmented material. Clip was  placed.                           - Non-bleeding superficial gastric ulcers with                            pigmented material at angularis. Biopsied.                           - Hematin (altered blood/coffee-ground-like                            material) in the entire stomach, but no active                            bleeding at time of procedure.                           - Normal examined duodenum. Recommendation:           - Await pathology results.                           - Use sucralfate suspension 1 gram PO QID.                           - Patient at moderate risk of rebleeding if                            anticoagulation is resumed, although clip placement                            may reduce that risk somewhat. Will discuss with                            patient's cardiologist. Procedure Code(s):        --- Professional ---                           [phone], Esophagogastroduodenoscopy, flexible,  transoral; with biopsy, single or multiple Diagnosis Code(s):        --- Professional ---                           K25.9, Gastric ulcer, unspecified as acute or                            chronic, without hemorrhage or perforation                           K92.2, Gastrointestinal hemorrhage, unspecified                           D62, Acute posthemorrhagic anemia                           K92.0, Hematemesis CPT copyright 2019 [employer]. All rights reserved. The codes documented in this report are preliminary and upon coder review may  be revised to meet current compliance requirements. Ronald Lobo, MD 11/19/2019 4:44:17 PM This report has been signed electronically. Number of Addenda: 0

## 2019-11-20 LAB — GLUCOSE, CAPILLARY
Glucose-Capillary: 138 mg/dL — ABNORMAL HIGH (ref 70–99)
Glucose-Capillary: 117 mg/dL — ABNORMAL HIGH (ref 70–99)
Glucose-Capillary: 159 mg/dL — ABNORMAL HIGH (ref 70–99)
Glucose-Capillary: 113 mg/dL — ABNORMAL HIGH (ref 70–99)
Glucose-Capillary: 111 mg/dL — ABNORMAL HIGH (ref 70–99)

## 2019-11-20 LAB — CBC
HCT: 28.1 % — ABNORMAL LOW (ref 39.0–52.0)
Hemoglobin: 9.1 g/dL — ABNORMAL LOW (ref 13.0–17.0)
MCH: 30.8 pg (ref 26.0–34.0)
MCHC: 32.4 g/dL (ref 30.0–36.0)
MCV: 95.3 fL (ref 80.0–100.0)
Platelets: 144 10*3/uL — ABNORMAL LOW (ref 150–400)
RBC: 2.95 MIL/uL — ABNORMAL LOW (ref 4.22–5.81)
RDW: 14.9 % (ref 11.5–15.5)
WBC: 13.5 10*3/uL — ABNORMAL HIGH (ref 4.0–10.5)
nRBC: 0.4 % — ABNORMAL HIGH (ref 0.0–0.2)

## 2019-11-20 LAB — COMPREHENSIVE METABOLIC PANEL
ALT: 826 U/L — ABNORMAL HIGH (ref 0–44)
AST: 138 U/L — ABNORMAL HIGH (ref 15–41)
Albumin: 1.9 g/dL — ABNORMAL LOW (ref 3.5–5.0)
Alkaline Phosphatase: 167 U/L — ABNORMAL HIGH (ref 38–126)
Anion gap: 4 — ABNORMAL LOW (ref 5–15)
BUN: 71 mg/dL — ABNORMAL HIGH (ref 8–23)
CO2: 21 mmol/L — ABNORMAL LOW (ref 22–32)
Calcium: 7.8 mg/dL — ABNORMAL LOW (ref 8.9–10.3)
Chloride: 111 mmol/L (ref 98–111)
Creatinine, Ser: 1.9 mg/dL — ABNORMAL HIGH (ref 0.61–1.24)
GFR calc Af Amer: 40 mL/min — ABNORMAL LOW (ref >60)
GFR calc non Af Amer: 35 mL/min — ABNORMAL LOW (ref >60)
Glucose, Bld: 133 mg/dL — ABNORMAL HIGH (ref 70–99)
Potassium: 5 mmol/L (ref 3.5–5.1)
Sodium: 136 mmol/L (ref 135–145)
Total Bilirubin: 1.4 mg/dL — ABNORMAL HIGH (ref 0.3–1.2)
Total Protein: 4.2 g/dL — ABNORMAL LOW (ref 6.5–8.1)

## 2019-11-20 LAB — PREPARE RBC (CROSSMATCH)

## 2019-11-20 MED ORDER — DILTIAZEM HCL 60 MG PO TABS
30.0000 mg | ORAL_TABLET | Freq: Four times a day (QID) | ORAL | Status: DC
  Administered 2019-11-20 – 2019-11-25 (×18): 30 mg via ORAL
  Filled 2019-11-20 (×18): qty 1

## 2019-11-20 MED ORDER — GERHARDT'S BUTT CREAM
TOPICAL_CREAM | Freq: Three times a day (TID) | CUTANEOUS | Status: AC
  Administered 2019-11-21: 1 via TOPICAL
  Filled 2019-11-20: qty 1

## 2019-11-20 MED ORDER — SODIUM CHLORIDE 0.9% IV SOLUTION: Freq: Once | INTRAVENOUS | Status: AC

## 2019-11-20 MED ORDER — METOPROLOL TARTRATE 25 MG PO TABS
25.0000 mg | ORAL_TABLET | Freq: Two times a day (BID) | ORAL | Status: DC
  Administered 2019-11-20 – 2019-11-22 (×5): 25 mg via ORAL
  Filled 2019-11-20 (×5): qty 1

## 2019-11-20 MED ORDER — DIGOXIN 0.25 MG/ML IJ SOLN
0.2500 mg | Freq: Once | INTRAMUSCULAR | Status: AC
  Administered 2019-11-20: 0.25 mg via INTRAVENOUS
  Filled 2019-11-20: qty 2

## 2019-11-20 MED ORDER — METOPROLOL TARTRATE 25 MG PO TABS: 25.0000 mg | ORAL_TABLET | Freq: Two times a day (BID) | ORAL | Status: DC

## 2019-11-20 NOTE — Progress Notes (Signed)
Mild decline in hemoglobin over past 24 hours from 9.6 to 9.1, associated with improvement in creatinine but moderate rise in BUN from 60 to current level of 71.  However, no BM's overnight.  Not clear if this reflects mild further blood loss or, more likely, equilibration with continued passage of blood through the GI tract.  The patient clearly had a significant bleed while on heparin, probably on the order of 5 units (received 1 unit of packed cells yesterday, and has had a roughly 4 g drop in hemoglobin from baseline).  The patient is a little bit despondent about being in the hospital, but I explained how it is important to continue to observe him because of the possibility of further bleeding, in particular, before anticoagulation is resumed.  The patient endorses minimal dyspnea but is not currently on oxygen.  Exam: Somewhat quiet, slightly drowsy, no acute distress.  Chest is clear, heart is without overt murmur, vital signs satisfactory, abdomen nontender.  Impression:  1.  Gastric ulcer with recent bleeding while on anticoagulation which is currently being held, bleeding probably quiescent at present.  On dual agent antipeptic therapy.  2.  Posthemorrhagic anemia, being transfused 1 unit today  3.  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  Discussed case with Dr. Doylene Canard.  Continue to hold on anticoagulation resumption until Monday.  2.  Monitor hemoglobin and renal function  3.  Continue antipeptic therapy (pantoprazole 40 mg IV every 24 hours in view of renal insufficiency, sucralfate 4 times daily)  4.  Await gastric biopsies and consider treatment for Helicobacter pylori infection if present.  Cleotis Nipper, M.D. Pager [phone] If no answer or after 5 PM call [phone]

## 2019-11-20 NOTE — Progress Notes (Signed)
Ref: [name], MD   Subjective:  Awake and asking for food. Atrial fibrillation with moderate ventricular response. Improving renal function but increased liver enzymes. Bilirubin level remains slightly high but stable over 3 weeks period. Afebrile. Further drop in Hgb to 9.1 gm. Patient had upper GI bleed that has mostly resolved. He remains off heparin for now.  Objective:  Vital Signs in the last 24 hours: Temp:  [97.7 F (36.5 C)-98.8 F (37.1 C)] 97.7 F (36.5 C) (02/12 0730) Pulse Rate:  [52-131] 110 (02/12 0839) Cardiac Rhythm: Atrial fibrillation (02/12 0700) Resp:  [16-32] 24 (02/12 0727) BP: (84-116)/(48-79) 111/77 (02/12 0839) SpO2:  [93 %-100 %] 96 % (02/12 0730) Weight:  [109.3 kg] 109.3 kg (02/12 0308)  Physical Exam: BP Readings from Last 1 Encounters:  11/20/19 111/77     Wt Readings from Last 1 Encounters:  11/20/19 109.3 kg    Weight change: -1.5 kg Body mass index is 31.79 kg/m. HEENT: /AT, Eyes-Blue, PERL, EOMI, Conjunctiva-Pink, Sclera-Non-icteric Neck: No JVD, No bruit, Trachea midline. Lungs:  Clearing, Bilateral. Cardiac:  Regular rhythm, normal S1 and S2, no S3. II/VI systolic murmur. Abdomen:  Soft, non-tender. BS present. Extremities:  No edema present. No cyanosis. No clubbing. CNS: AxOx3, Cranial nerves grossly intact, moves all 4 extremities.  Skin: Warm and dry. Ecchymosis over both arms   Intake/Output from previous day: 02/11 0701 - 02/12 0700 In: 1587.7 [P.O.:120; I.V.:1060.2; Blood:407.5] Out: 1775 [Urine:1775]    Lab Results: BMET    Component Value Date/Time   NA 136 11/20/2019 0109   NA 138 11/19/2019 0215   NA 136 11/18/2019 0209   K 5.0 11/20/2019 0109   K 5.6 (H) 11/19/2019 0215   K 5.3 (H) 11/18/2019 0209   CL 111 11/20/2019 0109   CL 111 11/19/2019 0215   CL 105 11/18/2019 0209   CO2 21 (L) 11/20/2019 0109   CO2 21 (L) 11/19/2019 0215   CO2 22 11/18/2019 0209   GLUCOSE 133 (H) 11/20/2019 0109   GLUCOSE  166 (H) 11/19/2019 0215   GLUCOSE 165 (H) 11/18/2019 0209   BUN 71 (H) 11/20/2019 0109   BUN 60 (H) 11/19/2019 0215   BUN 51 (H) 11/18/2019 0209   CREATININE 1.90 (H) 11/20/2019 0109   CREATININE 2.17 (H) 11/19/2019 0215   CREATININE 2.27 (H) 11/18/2019 0209   CALCIUM 7.8 (L) 11/20/2019 0109   CALCIUM 7.7 (L) 11/19/2019 0215   CALCIUM 8.5 (L) 11/18/2019 0209   GFRNONAA 35 (L) 11/20/2019 0109   GFRNONAA 30 (L) 11/19/2019 0215   GFRNONAA 28 (L) 11/18/2019 0209   GFRAA 40 (L) 11/20/2019 0109   GFRAA 34 (L) 11/19/2019 0215   GFRAA 32 (L) 11/18/2019 0209   CBC    Component Value Date/Time   WBC 13.5 (H) 11/20/2019 0109   RBC 2.95 (L) 11/20/2019 0109   HGB 9.1 (L) 11/20/2019 0109   HCT 28.1 (L) 11/20/2019 0109   PLT 144 (L) 11/20/2019 0109   MCV 95.3 11/20/2019 0109   MCH 30.8 11/20/2019 0109   MCHC 32.4 11/20/2019 0109   RDW 14.9 11/20/2019 0109   LYMPHSABS 0.9 11/16/2019 0241   MONOABS 0.8 11/16/2019 0241   EOSABS 0.0 11/16/2019 0241   BASOSABS 0.1 11/16/2019 0241   HEPATIC Function Panel Recent Labs    10/26/19 1913 11/15/19 1321 11/20/19 0109  PROT 6.1* 5.8* 4.2*   HEMOGLOBIN A1C No components found for: HGA1C,  MPG CARDIAC ENZYMES No results found for: CKTOTAL, CKMB, CKMBINDEX, TROPONINI BNP No  results for input(s): PROBNP in the last 8760 hours. TSH Recent Labs    11/15/19 1700  TSH 1.647   CHOLESTEROL No results for input(s): CHOL in the last 8760 hours.  Scheduled Meds: . sodium chloride   Intravenous Once  . sodium chloride   Intravenous Once  . vitamin C  250 mg Oral Daily  . atorvastatin  10 mg Oral q1800  . diltiazem  30 mg Oral Q6H  . insulin aspart  0-15 Units Subcutaneous TID WC  . insulin glargine  6 Units Subcutaneous QHS  . metoprolol tartrate  25 mg Oral BID  . multivitamin with minerals  1 tablet Oral Daily  . omega-3 acid ethyl esters  1 g Oral Daily  . pantoprazole (PROTONIX) IV  40 mg Intravenous Q24H  . sodium chloride flush  3  mL Intravenous Q12H  . sodium zirconium cyclosilicate  10 g Oral TID  . sucralfate  1 g Oral TID WC & HS   Continuous Infusions: . dextrose 5 % and 0.45% NaCl 50 mL/hr at 11/20/19 0500  . 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 Resume low dose Betablocker and calcium channel blockers for rate control. Hepatitis panel for elevated ASL and ALT. Hold heparin till Monday.  Transfuse one unit PRBC for symptomatic anemia. Increase activity as tolerated. Discussed care with wife and daughter along with patient about postponing cardiac interventions for now. Dr. Terrence Dupont covering for weakend.    LOS: 5 days   Time spent including chart review, lab review, examination, discussion with patient : 40 min   Dixie Dials  MD  11/20/2019, 9:07 AM

## 2019-11-20 NOTE — Progress Notes (Signed)
MD notified regarding pts HR and BP. New order received. Will continue to monitor.

## 2019-11-20 NOTE — Plan of Care (Signed)
  Problem: Education: Goal: Knowledge of General Education information will improve Description: Including pain rating scale, medication(s)/side effects and non-pharmacologic comfort measures Outcome: Progressing   Problem: Health Behavior/Discharge Planning: Goal: Ability to manage health-related needs will improve Outcome: Progressing   Problem: Clinical Measurements: Goal: Ability to maintain clinical measurements within normal limits will improve Outcome: Progressing Goal: Will remain free from infection Outcome: Progressing Goal: Diagnostic test results will improve Outcome: Progressing Goal: Respiratory complications will improve Outcome: Progressing Goal: Cardiovascular complication will be avoided Outcome: Progressing   Problem: Activity: Goal: Risk for activity intolerance will decrease Outcome: Progressing   Problem: Nutrition: Goal: Adequate nutrition will be maintained Outcome: Progressing   Problem: Coping: Goal: Level of anxiety will decrease Outcome: Progressing   Problem: Elimination: Goal: Will not experience complications related to bowel motility Outcome: Progressing Goal: Will not experience complications related to urinary retention Outcome: Progressing   Problem: Pain Managment: Goal: General experience of comfort will improve Outcome: Progressing   Problem: Safety: Goal: Ability to remain free from injury will improve Outcome: Progressing   Problem: Skin Integrity: Goal: Risk for impaired skin integrity will decrease Outcome: Progressing   Problem: Respiratory: Goal: Will maintain a patent airway Outcome: Progressing Goal: Complications related to the disease process, condition or treatment will be avoided or minimized Outcome: Progressing

## 2019-11-20 NOTE — Progress Notes (Signed)
[facility] Progress Note    Assessment/ Plan:   1.AKI on CKD IIIa, nonoliguric, solitary kidney: Cr 1.8 >2.75 >>>2.17>1.9 today. Baseline 1.4-1.6. Suspect hemodynamically mediated 2/2 poor renal perfusion. Resolving, continue to monitor trend especially given tenuous clinical course. Set end time to fluids, encourage oral po.   2. UGIB: Resolved, large gastric ulcer clipped per GI via EGD on 2/11. Hgb 13.8>9.1, s/p 1 U pRBCs, receiving additional this am. GI on board.   3. Transaminitis: AST 138, ALT 826.  Likely related to transient hypotension on 2/10, BP normalized s/p IVF. Will monitor.   4. Acute hypoxemic respiratory failure: Resolved, on RA. Favoring resolving COVID pna. O/p f/u with PCCM.   5. 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 Restarting anticoagulation in 3 days per cardiology primary/GI.   6. Mild Hyperkalemia: K 5.0, resolved with lokelma.   7. T2DM: A1c 7.3. Diet controlled, SSI per primary.    8. Right popliteal vein DVT: Indeterminate age.  Holding IV heparin, restart anti-coag in 3 days as above.   Subjective:   EGD yesterday, tolerated procedure well.  Doing okay this morning, still feels quite fatigued.  Trying to eat breakfast.  Ready to get everything fine-tuned so that he can go home.   Objective:   BP 116/79   Pulse 60   Temp 97.7 F (36.5 C) (Oral)   Resp (!) 24   Ht 6\' 1"  (1.854 m)   Wt 109.3 kg   SpO2 96%   BMI 31.79 kg/m   Intake/Output Summary (Last 24 hours) at 11/20/2019 0825 Last data filed at 11/20/2019 6789 Gross per 24 hour  Intake 1587.7 ml  Output 2050 ml  Net -462.3 ml   Weight change: -1.5 kg  Physical Exam: General: Alert, ill-appearing gentleman in no acute distress Cardiac: Irregularly irregular, tachycardic Lungs: Clear bilaterally with few scattered crackles, mild tachypnea on room air satting appropriately MSK: Moving all extremities Extremities:  Warm, dry trace edema to midshin bilaterally   Imaging: No results found.  Labs: BMET Recent Labs  Lab 11/15/19 1118 11/16/19 0241 11/17/19 0204 11/18/19 0209 11/19/19 0215 11/20/19 0109  NA 133* 136 134* 136 138 136  K 4.7 5.6* 4.9 5.3* 5.6* 5.0  CL 104 98 104 105 111 111  CO2 16* 17* 18* 22 21* 21*  GLUCOSE 184* 123* 151* 165* 166* 133*  BUN 35* 44* 55* 51* 60* 71*  CREATININE 1.80* 2.75* 2.69* 2.27* 2.17* 1.90*  CALCIUM 8.7* 8.7* 8.0* 8.5* 7.7* 7.8*   CBC Recent Labs  Lab 11/16/19 0241 11/17/19 0204 11/18/19 0209 11/18/19 0209 11/19/19 0109 11/19/19 0215 11/19/19 1016 11/20/19 0109  WBC 13.2*   < > 14.9*  --  17.1* 13.3*  --  13.5*  NEUTROABS 11.0*  --   --   --   --   --   --   --   HGB 13.9   < > 13.8   < > 10.7* 9.6* 10.1* 9.1*  HCT 42.2   < > 42.5   < > 32.8* 29.1* 30.9* 28.1*  MCV 94.4   < > 95.9  --  95.9 95.7  --  95.3  PLT 103*   < > 199  --  203 188  --  144*   < > = values in this interval not displayed.    Medications:    . sodium chloride   Intravenous Once  . sodium chloride   Intravenous Once  .  vitamin C  250 mg Oral Daily  . atorvastatin  10 mg Oral q1800  . diltiazem  30 mg Oral Q6H  . insulin aspart  0-15 Units Subcutaneous TID WC  . insulin glargine  6 Units Subcutaneous QHS  . metoprolol tartrate  25 mg Oral BID  . multivitamin with minerals  1 tablet Oral Daily  . omega-3 acid ethyl esters  1 g Oral Daily  . pantoprazole (PROTONIX) IV  40 mg Intravenous Q24H  . sodium chloride flush  3 mL Intravenous Q12H  . sodium zirconium cyclosilicate  10 g Oral TID  . sucralfate  1 g Oral TID WC & HS     Patriciaann Clan, DO  Family Medicine PGY-2  11/20/2019, 8:25 AM

## 2019-11-21 LAB — CBC
HCT: 31.1 % — ABNORMAL LOW (ref 39.0–52.0)
Hemoglobin: 10.5 g/dL — ABNORMAL LOW (ref 13.0–17.0)
MCH: 31.1 pg (ref 26.0–34.0)
MCHC: 33.8 g/dL (ref 30.0–36.0)
MCV: 92 fL (ref 80.0–100.0)
Platelets: 139 10*3/uL — ABNORMAL LOW (ref 150–400)
RBC: 3.38 MIL/uL — ABNORMAL LOW (ref 4.22–5.81)
RDW: 15.5 % (ref 11.5–15.5)
WBC: 15.5 10*3/uL — ABNORMAL HIGH (ref 4.0–10.5)
nRBC: 0.5 % — ABNORMAL HIGH (ref 0.0–0.2)

## 2019-11-21 LAB — GLUCOSE, CAPILLARY
Glucose-Capillary: 99 mg/dL (ref 70–99)
Glucose-Capillary: 121 mg/dL — ABNORMAL HIGH (ref 70–99)
Glucose-Capillary: 137 mg/dL — ABNORMAL HIGH (ref 70–99)
Glucose-Capillary: 116 mg/dL — ABNORMAL HIGH (ref 70–99)

## 2019-11-21 LAB — RENAL FUNCTION PANEL
Albumin: 2 g/dL — ABNORMAL LOW (ref 3.5–5.0)
Anion gap: 11 (ref 5–15)
BUN: 51 mg/dL — ABNORMAL HIGH (ref 8–23)
CO2: 17 mmol/L — ABNORMAL LOW (ref 22–32)
Calcium: 8.2 mg/dL — ABNORMAL LOW (ref 8.9–10.3)
Chloride: 108 mmol/L (ref 98–111)
Creatinine, Ser: 1.97 mg/dL — ABNORMAL HIGH (ref 0.61–1.24)
GFR calc Af Amer: 38 mL/min — ABNORMAL LOW (ref >60)
GFR calc non Af Amer: 33 mL/min — ABNORMAL LOW (ref >60)
Glucose, Bld: 102 mg/dL — ABNORMAL HIGH (ref 70–99)
Phosphorus: 3.5 mg/dL (ref 2.5–4.6)
Potassium: 5.2 mmol/L — ABNORMAL HIGH (ref 3.5–5.1)
Sodium: 136 mmol/L (ref 135–145)

## 2019-11-21 LAB — BPAM RBC
Blood Product Expiration Date: 202103102359
Blood Product Expiration Date: 202103122359
ISSUE DATE / TIME: 202102110559
ISSUE DATE / TIME: 202102120922
Unit Type and Rh: 7300
Unit Type and Rh: 7300

## 2019-11-21 LAB — TYPE AND SCREEN
ABO/RH(D): B POS
Antibody Screen: NEGATIVE
Unit division: 0
Unit division: 0

## 2019-11-21 LAB — HEPATITIS PANEL, ACUTE
HCV Ab: NONREACTIVE
Hep A IgM: NONREACTIVE
Hep B C IgM: NONREACTIVE
Hepatitis B Surface Ag: NONREACTIVE

## 2019-11-21 MED ORDER — HYDROMORPHONE HCL 1 MG/ML IJ SOLN: 0.5000 mg | Freq: Once | INTRAMUSCULAR | Status: DC | PRN

## 2019-11-21 MED ORDER — MUSCLE RUB 10-15 % EX CREA
TOPICAL_CREAM | CUTANEOUS | Status: DC | PRN
  Filled 2019-11-21: qty 85

## 2019-11-21 MED ORDER — ACETAMINOPHEN 325 MG PO TABS
650.0000 mg | ORAL_TABLET | Freq: Four times a day (QID) | ORAL | Status: DC | PRN
  Administered 2019-11-22: 650 mg via ORAL
  Filled 2019-11-21 (×2): qty 2

## 2019-11-21 MED ORDER — SODIUM ZIRCONIUM CYCLOSILICATE 10 G PO PACK
10.0000 g | PACK | Freq: Every day | ORAL | Status: AC
  Administered 2019-11-21: 10 g via ORAL
  Filled 2019-11-21: qty 1

## 2019-11-21 MED ORDER — GUAIFENESIN-DM 100-10 MG/5ML PO SYRP
5.0000 mL | ORAL_SOLUTION | ORAL | Status: DC | PRN
  Filled 2019-11-21: qty 5

## 2019-11-21 MED ORDER — METOPROLOL TARTRATE 5 MG/5ML IV SOLN
2.5000 mg | Freq: Four times a day (QID) | INTRAVENOUS | Status: DC
  Administered 2019-11-21 – 2019-11-22 (×2): 2.5 mg via INTRAVENOUS
  Filled 2019-11-21 (×2): qty 5

## 2019-11-21 NOTE — Progress Notes (Signed)
[facility] Progress Note    Assessment/ Plan:   1.AKI on CKD IIIa, nonoliguric, solitary kidney: Stable, Cr 1.8 >2.75 >>>2.17>1.9>1.97 today. Baseline 1.4-1.6. Mild hypervolemia on exam. Suspect hemodynamically mediated 2/2 poor renal perfusion.  Will monitor for an additional 24 hours given recent tenuous clinical course, may consider Lasix trial pending creatinine trend on 2/14.    2. UGIB: Resolved s/p ulcer clip on 2/11, Hgb stable at 10.5. Per GI.   3. Transaminitis: AST 138, ALT 826 on 2/13. Hepatitis panel negative. Likely related to transient hypotension on 2/10.   4. Acute hypoxemic respiratory failure: Resolved, on RA. Favoring resolving COVID pna. O/p f/u with PCCM.   5. 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 Restarting anticoagulation on Monday, 2/15, per cardiology primary/GI.   6. Mild Hyperkalemia: K 5.2, will give lokelma x1.   7. T2DM: A1c 7.3. SSI per primary.    Subjective:   No acute events overnight.  Still quite fatigued, but feeling better compared to yesterday.  Not eating well, states he does not like the food and has poor appetite currently.  Drinking okay.  Good UOP.   Objective:   BP (!) 114/55   Pulse (!) 114   Temp 98.6 F (37 C) (Oral)   Resp (!) 38   Ht 6\' 1"  (1.854 m)   Wt 106.5 kg   SpO2 93%   BMI 30.98 kg/m   Intake/Output Summary (Last 24 hours) at 11/21/2019 0755 Last data filed at 11/21/2019 9485 Gross per 24 hour  Intake 1535.23 ml  Output 2300 ml  Net -764.77 ml   Weight change: -2.8 kg  Physical Exam: General: Alert, older gentleman in NAD Cardiac: Irregular irregular, tachycardic Lungs: Clear bilaterally with few bibasilar crackles, mild tachypnea if talking on room air satting appropriately Extremities: Warm, dry, +1 pitting edema to level of knee bilaterally  Imaging: No results found.  Labs: BMET Recent Labs  Lab 11/15/19 1118 11/16/19 0241 11/17/19 0204  11/18/19 0209 11/19/19 0215 11/20/19 0109 11/21/19 0234  NA 133* 136 134* 136 138 136 136  K 4.7 5.6* 4.9 5.3* 5.6* 5.0 5.2*  CL 104 98 104 105 111 111 108  CO2 16* 17* 18* 22 21* 21* 17*  GLUCOSE 184* 123* 151* 165* 166* 133* 102*  BUN 35* 44* 55* 51* 60* 71* 51*  CREATININE 1.80* 2.75* 2.69* 2.27* 2.17* 1.90* 1.97*  CALCIUM 8.7* 8.7* 8.0* 8.5* 7.7* 7.8* 8.2*  PHOS  --   --   --   --   --   --  3.5   CBC Recent Labs  Lab 11/16/19 0241 11/17/19 0204 11/19/19 0109 11/19/19 0109 11/19/19 0215 11/19/19 1016 11/20/19 0109 11/21/19 0258  WBC 13.2*   < > 17.1*  --  13.3*  --  13.5* 15.5*  NEUTROABS 11.0*  --   --   --   --   --   --   --   HGB 13.9   < > 10.7*   < > 9.6* 10.1* 9.1* 10.5*  HCT 42.2   < > 32.8*   < > 29.1* 30.9* 28.1* 31.1*  MCV 94.4   < > 95.9  --  95.7  --  95.3 92.0  PLT 103*   < > 203  --  188  --  144* 139*   < > = values in this interval not displayed.    Medications:    . sodium chloride   Intravenous Once  .  vitamin C  250 mg Oral Daily  . atorvastatin  10 mg Oral q1800  . diltiazem  30 mg Oral Q6H  . Gerhardt's butt cream   Topical TID  . insulin aspart  0-15 Units Subcutaneous TID WC  . insulin glargine  6 Units Subcutaneous QHS  . metoprolol tartrate  25 mg Oral BID  . multivitamin with minerals  1 tablet Oral Daily  . omega-3 acid ethyl esters  1 g Oral Daily  . pantoprazole (PROTONIX) IV  40 mg Intravenous Q24H  . sodium chloride flush  3 mL Intravenous Q12H  . sucralfate  1 g Oral TID WC & HS     Patriciaann Clan, DO  Family Medicine PGY-2  11/21/2019, 7:55 AM

## 2019-11-21 NOTE — Progress Notes (Signed)
Hgb 9.1 -> 10.5 past 24 hrs following tx 1 u prc's (appropriate rise post transfusion).  BUN dropped from 71 to 51 during the same interval (despite a slight rise in creatinine).  Patient has had 1, firm, dark brown stool.  Overall picture consistent with no further bleeding.  Gastric biopsies pending.  Impression: Quiescent bleed from gastric ulcer.  Patient appears to be "out of the woods" with respect to risk of spontaneous rebleeding.  However, I still think it is prudent not to "challenge" him with anticoagulation quite yet, until there has been more of an opportunity for clot maturation.  RECOMM:  Continue with previous plan:  observe patient until Monday and resume anticoagulation then.  Patient complaining of hip pain and cramps.  Will order K pad.  Cleotis Nipper, M.D. Pager [phone] If no answer or after 5 PM call [phone]

## 2019-11-21 NOTE — Progress Notes (Signed)
Patient c/o of severe cramping in left hip. Patient given heating pack and warm blanket to soothe pain, but not effective.  Cards fellow called - pt did not have any analgesics ordered. MD told that patient has hx of renal issues and recent hx of upper GI bleed this admit. Received orders for tylenol q6h and one time dose of dilaudid prn.  Placed orders for muscle rub in standing orders. Muscle rub applied to left hip - waiting to see if intervention effective. Will give additional medication if not.  Received approval to go ahead and get labs early this AM by MD. Want to ensure no electrolyte imbalances contributing to cramping.  Awaiting lab results.

## 2019-11-21 NOTE — Progress Notes (Signed)
Subjective:  Patient denies any chest pain or shortness of breath.  Up in chair.  Remains in A. fib with moderate ventricular response.  No further episodes of bleeding hemoglobin stable. Objective:  Vital Signs in the last 24 hours: Temp:  [97.4 F (36.3 C)-98.6 F (37 C)] 98.6 F (37 C) (02/13 0736) Pulse Rate:  [39-124] 102 (02/13 0847) Resp:  [14-38] 27 (02/13 0847) BP: (90-114)/(45-78) 102/55 (02/13 0847) SpO2:  [93 %-99 %] 96 % (02/13 0847) Weight:  [106.5 kg] 106.5 kg (02/13 0500)  Intake/Output from previous day: 02/12 0701 - 02/13 0700 In: 1535.2 [P.O.:480; I.V.:731.2; Blood:324] Out: 2425 [Urine:2425] Intake/Output from this shift: Total I/O In: 120 [P.O.:120] Out: 150 [Urine:150]  Physical Exam: Neck: no adenopathy, no carotid bruit, no JVD and supple, symmetrical, trachea midline Lungs: Decreased breath sound at bases with occasional rhonchi noted Heart: irregularly irregular rhythm, S1, S2 normal and 2/6 systolic murmur noted Abdomen: soft, non-tender; bowel sounds normal; no masses,  no organomegaly Extremities: extremities normal, atraumatic, no cyanosis or edema  Lab Results: Recent Labs    11/20/19 0109 11/21/19 0258  WBC 13.5* 15.5*  HGB 9.1* 10.5*  PLT 144* 139*   Recent Labs    11/20/19 0109 11/21/19 0234  NA 136 136  K 5.0 5.2*  CL 111 108  CO2 21* 17*  GLUCOSE 133* 102*  BUN 71* 51*  CREATININE 1.90* 1.97*   No results for input(s): TROPONINI in the last 72 hours.  Invalid input(s): CK, MB Hepatic Function Panel Recent Labs    11/20/19 0109 11/20/19 0109 11/21/19 0234  PROT 4.2*  --   --   ALBUMIN 1.9*   < > 2.0*  AST 138*  --   --   ALT 826*  --   --   ALKPHOS 167*  --   --   BILITOT 1.4*  --   --    < > = values in this interval not displayed.   No results for input(s): CHOL in the last 72 hours. No results for input(s): PROTIME in the last 72 hours.  Imaging: Imaging results have been reviewed and No results  found.  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  LOS: 6 days    Charolette Forward 11/21/2019, 9:19 AM

## 2019-11-22 ENCOUNTER — Other Ambulatory Visit: Payer: Self-pay | Admitting: Family Medicine

## 2019-11-22 LAB — BASIC METABOLIC PANEL
Anion gap: 6 (ref 5–15)
BUN: 30 mg/dL — ABNORMAL HIGH (ref 8–23)
CO2: 21 mmol/L — ABNORMAL LOW (ref 22–32)
Calcium: 7.8 mg/dL — ABNORMAL LOW (ref 8.9–10.3)
Chloride: 106 mmol/L (ref 98–111)
Creatinine, Ser: 1.74 mg/dL — ABNORMAL HIGH (ref 0.61–1.24)
GFR calc Af Amer: 45 mL/min — ABNORMAL LOW (ref >60)
GFR calc non Af Amer: 39 mL/min — ABNORMAL LOW (ref >60)
Glucose, Bld: 199 mg/dL — ABNORMAL HIGH (ref 70–99)
Potassium: 4.1 mmol/L (ref 3.5–5.1)
Sodium: 133 mmol/L — ABNORMAL LOW (ref 135–145)

## 2019-11-22 LAB — CBC
HCT: 27.6 % — ABNORMAL LOW (ref 39.0–52.0)
Hemoglobin: 9.1 g/dL — ABNORMAL LOW (ref 13.0–17.0)
MCH: 31.3 pg (ref 26.0–34.0)
MCHC: 33 g/dL (ref 30.0–36.0)
MCV: 94.8 fL (ref 80.0–100.0)
Platelets: 125 10*3/uL — ABNORMAL LOW (ref 150–400)
RBC: 2.91 MIL/uL — ABNORMAL LOW (ref 4.22–5.81)
RDW: 16.6 % — ABNORMAL HIGH (ref 11.5–15.5)
WBC: 12.1 10*3/uL — ABNORMAL HIGH (ref 4.0–10.5)
nRBC: 0 % (ref 0.0–0.2)

## 2019-11-22 LAB — GLUCOSE, CAPILLARY
Glucose-Capillary: 96 mg/dL (ref 70–99)
Glucose-Capillary: 153 mg/dL — ABNORMAL HIGH (ref 70–99)
Glucose-Capillary: 129 mg/dL — ABNORMAL HIGH (ref 70–99)
Glucose-Capillary: 126 mg/dL — ABNORMAL HIGH (ref 70–99)

## 2019-11-22 MED ORDER — METOPROLOL TARTRATE 5 MG/5ML IV SOLN: 2.5000 mg | Freq: Four times a day (QID) | INTRAVENOUS | Status: DC | PRN

## 2019-11-22 MED ORDER — METOPROLOL TARTRATE 25 MG PO TABS
25.0000 mg | ORAL_TABLET | Freq: Two times a day (BID) | ORAL | Status: DC
  Administered 2019-11-23 (×2): 25 mg via ORAL
  Filled 2019-11-22 (×3): qty 1

## 2019-11-22 MED ORDER — AMOXICILLIN-POT CLAVULANATE 875-125 MG PO TABS
1.0000 | ORAL_TABLET | Freq: Two times a day (BID) | ORAL | Status: DC
  Administered 2019-11-22 – 2019-11-24 (×4): 1 via ORAL
  Filled 2019-11-22 (×4): qty 1

## 2019-11-22 NOTE — Progress Notes (Signed)
Subjective:  Patient denies any chest pain or shortness of breath.  Denies any melena states had BM.  Hemoglobin back down to 9.1 today.  BUN and creatinine trending down no obvious bleeding noted.  Heart rate better controlled remains in A. fib with controlled ventricular response  Objective:  Vital Signs in the last 24 hours: Temp:  [97.6 F (36.4 C)-99.4 F (37.4 C)] 98.6 F (37 C) (02/14 0741) Pulse Rate:  [50-100] 50 (02/14 0741) Resp:  [15-30] 30 (02/14 0741) BP: (93-117)/(52-80) 102/61 (02/14 0741) SpO2:  [92 %-99 %] 97 % (02/14 0741) Weight:  [105.8 kg] 105.8 kg (02/14 0350)  Intake/Output from previous day: 02/13 0701 - 02/14 0700 In: 843 [P.O.:840; I.V.:3] Out: 1225 [Urine:1225] Intake/Output from this shift: Total I/O In: -  Out: 200 [Urine:200]  Physical Exam: Neck: no adenopathy, no carotid bruit, no JVD and supple, symmetrical, trachea midline Lungs: Decreased breath sound at bases Heart: irregularly irregular rhythm, S1, S2 normal and 2/6 systolic murmur noted Abdomen: soft, non-tender; bowel sounds normal; no masses,  no organomegaly Extremities: extremities normal, atraumatic, no cyanosis or edema  Lab Results: Recent Labs    11/21/19 0258 11/22/19 0847  WBC 15.5* 12.1*  HGB 10.5* 9.1*  PLT 139* 125*   Recent Labs    11/21/19 0234 11/22/19 0847  NA 136 133*  K 5.2* 4.1  CL 108 106  CO2 17* 21*  GLUCOSE 102* 199*  BUN 51* 30*  CREATININE 1.97* 1.74*   No results for input(s): TROPONINI in the last 72 hours.  Invalid input(s): CK, MB Hepatic Function Panel Recent Labs    11/20/19 0109 11/20/19 0109 11/21/19 0234  PROT 4.2*  --   --   ALBUMIN 1.9*   < > 2.0*  AST 138*  --   --   ALT 826*  --   --   ALKPHOS 167*  --   --   BILITOT 1.4*  --   --    < > = values in this interval not displayed.   No results for input(s): CHOL in the last 72 hours. No results for input(s): PROTIME in the last 72 hours.  Imaging: Imaging results have  been reviewed and No results found.  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  LOS: 7 days    Charolette Forward 11/22/2019, 10:44 AM

## 2019-11-22 NOTE — Progress Notes (Signed)
Patient is asymptomatic with his Mews score of yellow. This patient is Afib with a great heart rate, but he remains on Oxygen. Patient walked the hallway today and he is trying to gain more strength while ambulating. His sats on 2L/[place] is 97% and patient stated he is comfortable. He denies being short of breath. Will continue to monitor patient closely.

## 2019-11-22 NOTE — Plan of Care (Signed)
  Problem: Education: Goal: Knowledge of General Education information will improve Description: Including pain rating scale, medication(s)/side effects and non-pharmacologic comfort measures Outcome: Progressing   Problem: Clinical Measurements: Goal: Ability to maintain clinical measurements within normal limits will improve Outcome: Progressing Goal: Will remain free from infection Outcome: Progressing Goal: Diagnostic test results will improve Outcome: Progressing Goal: Respiratory complications will improve Outcome: Progressing Goal: Cardiovascular complication will be avoided Outcome: Progressing   Problem: Activity: Goal: Risk for activity intolerance will decrease Outcome: Progressing   Problem: Nutrition: Goal: Adequate nutrition will be maintained Outcome: Progressing   Problem: Coping: Goal: Level of anxiety will decrease Outcome: Progressing   Problem: Elimination: Goal: Will not experience complications related to bowel motility Outcome: Progressing Goal: Will not experience complications related to urinary retention Outcome: Progressing   Problem: Pain Managment: Goal: General experience of comfort will improve Outcome: Progressing   Problem: Safety: Goal: Ability to remain free from injury will improve Outcome: Progressing   Problem: Skin Integrity: Goal: Risk for impaired skin integrity will decrease Outcome: Progressing   Problem: Respiratory: Goal: Will maintain a patent airway Outcome: Progressing

## 2019-11-22 NOTE — Progress Notes (Signed)
[facility] Progress Note    Assessment/ Plan:   1.AKI on CKD IIIa, nonoliguric, solitary kidney: Improving. Cr 1.8 >2.75 >>>2.17>1.9>1.97>1.74 today. (Baseline 1.4-1.6). Appears only mild volume up on exam. Creatinine approaching baseline. Remained hemodynamically stable overnight. Will monitor for another 24 hours. Plan for low dose lasix tomorrow to help with volume status. Likely only need a few doses.   2. Mild hyponatremia: Na 133. Suspect 2/2 to CHF leading to nonosmotic release of ADH.  - fluid restriction, treat CHF (likely trial Lasix tomorrow for 1-2 days to help with volume status)  2. UGIB: Resolved s/p ulcer clip on 2/1. Hgb 9.1 this AM. Per GI. Recommed monitoring until 2/15. Plan to restart anticoag then. Plt downtrending daily. Did have episode of epistaxis this Am.  3. Transaminitis: AST 138, ALT 826 on 2/13. Hepatitis panel negative. Likely related to transient hypotension on 2/10.   4. Acute hypoxemic respiratory failure  Recent COVID PNA with pulmonary fibrosis: Some tachypnea overnight requiring 1L O2 while sleeping. - O/p f/u with PCCM.   5. 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 Restarting anticoagulation on Monday, 2/15, per cardiology primary/GI.   6. Mild Hyperkalemia: resolved. s/p lokelma x1 yesterday. K 4.1 this AM   7. T2DM: A1c 7.3. SSI per primary.    Subjective:   Patient doing well this AM. Had episode of epistaxis this AM that he notes is chronic for him (occurs ~2-3x/week). Denies any concerns or complaints.   Objective:   BP 102/61 (BP Location: Right Arm)   Pulse (!) 50   Temp 98.6 F (37 C) (Oral)   Resp (!) 30   Ht 6\' 1"  (1.854 m)   Wt 105.8 kg   SpO2 97%   BMI 30.77 kg/m   Intake/Output Summary (Last 24 hours) at 11/22/2019 0809 Last data filed at 11/22/2019 1660 Gross per 24 hour  Intake 723 ml  Output 1275 ml  Net -552 ml   Weight change: -0.7 kg  Physical Exam: General:  pleasant male lying comfortably in bed with wife at bedside, well nourished, well developed, in no acute distress with non-toxic appearance CV: regular rate and rhythm without murmurs, rubs, or gallops, trace to 1+ LE edema bilaterally Lungs: clear to auscultation bilaterally with normal work of breathing on 1L O2 [place] Abdomen: soft, non-tender, non-distended Skin: warm, dry  Extremities: warm and well perfused, large ecchymosis on right medial forearm, trace edema in arms/hands bilaterally  Imaging: No results found.  Labs: BMET Recent Labs  Lab 11/15/19 1118 11/16/19 0241 11/17/19 0204 11/18/19 0209 11/19/19 0215 11/20/19 0109 11/21/19 0234  NA 133* 136 134* 136 138 136 136  K 4.7 5.6* 4.9 5.3* 5.6* 5.0 5.2*  CL 104 98 104 105 111 111 108  CO2 16* 17* 18* 22 21* 21* 17*  GLUCOSE 184* 123* 151* 165* 166* 133* 102*  BUN 35* 44* 55* 51* 60* 71* 51*  CREATININE 1.80* 2.75* 2.69* 2.27* 2.17* 1.90* 1.97*  CALCIUM 8.7* 8.7* 8.0* 8.5* 7.7* 7.8* 8.2*  PHOS  --   --   --   --   --   --  3.5   CBC Recent Labs  Lab 11/16/19 0241 11/17/19 0204 11/19/19 0109 11/19/19 0109 11/19/19 0215 11/19/19 1016 11/20/19 0109 11/21/19 0258  WBC 13.2*   < > 17.1*  --  13.3*  --  13.5* 15.5*  NEUTROABS 11.0*  --   --   --   --   --   --   --  HGB 13.9   < > 10.7*   < > 9.6* 10.1* 9.1* 10.5*  HCT 42.2   < > 32.8*   < > 29.1* 30.9* 28.1* 31.1*  MCV 94.4   < > 95.9  --  95.7  --  95.3 92.0  PLT 103*   < > 203  --  188  --  144* 139*   < > = values in this interval not displayed.    Medications:    . sodium chloride   Intravenous Once  . vitamin C  250 mg Oral Daily  . atorvastatin  10 mg Oral q1800  . diltiazem  30 mg Oral Q6H  . Gerhardt's butt cream   Topical TID  . insulin aspart  0-15 Units Subcutaneous TID WC  . insulin glargine  6 Units Subcutaneous QHS  . metoprolol tartrate  2.5 mg Intravenous Q6H  . metoprolol tartrate  25 mg Oral BID  . multivitamin with minerals  1 tablet  Oral Daily  . omega-3 acid ethyl esters  1 g Oral Daily  . pantoprazole (PROTONIX) IV  40 mg Intravenous Q24H  . sodium chloride flush  3 mL Intravenous Q12H  . sucralfate  1 g Oral TID WC & HS     Danna Hefty, DO  Family Medicine PGY-2  11/22/2019, 8:09 AM

## 2019-11-22 NOTE — Progress Notes (Signed)
[facility] Progress Note  Subjective: No signs of further active bleeding. Slight drop in hemoglobin overnight. Patient is eating. Denies vomiting. Denies abdominal pain.  Gastric biopsies pending  Objective: Vital signs in last 24 hours: Temp:  [97.6 F (36.4 C)-99.4 F (37.4 C)] 98.6 F (37 C) (02/14 0741) Pulse Rate:  [50-100] 50 (02/14 0741) Resp:  [15-30] 30 (02/14 0741) BP: (93-117)/(52-80) 102/61 (02/14 0741) SpO2:  [92 %-99 %] 97 % (02/14 0741) Weight:  [105.8 kg] 105.8 kg (02/14 0350) Weight change: -0.7 kg   PE:  No distress  Heart regular rhythm  Lungs clear  Abdomen soft and nontender    Lab Results: Results for orders placed or performed during the hospital encounter of 11/15/19 (from the past 24 hour(s))  Glucose, capillary     Status: Abnormal   Collection Time: 11/21/19 11:28 AM  Result Value Ref Range   Glucose-Capillary 121 (H) 70 - 99 mg/dL   Comment 1 Notify RN    Comment 2 Document in Chart   Glucose, capillary     Status: Abnormal   Collection Time: 11/21/19  3:58 PM  Result Value Ref Range   Glucose-Capillary 137 (H) 70 - 99 mg/dL   Comment 1 Notify RN    Comment 2 Document in Chart   Glucose, capillary     Status: Abnormal   Collection Time: 11/21/19  9:03 PM  Result Value Ref Range   Glucose-Capillary 116 (H) 70 - 99 mg/dL   Comment 1 Notify RN    Comment 2 Document in Chart   Glucose, capillary     Status: None   Collection Time: 11/22/19  6:08 AM  Result Value Ref Range   Glucose-Capillary 96 70 - 99 mg/dL   Comment 1 Notify RN    Comment 2 Document in Chart   CBC     Status: Abnormal   Collection Time: 11/22/19  8:47 AM  Result Value Ref Range   WBC 12.1 (H) 4.0 - 10.5 K/uL   RBC 2.91 (L) 4.22 - 5.81 MIL/uL   Hemoglobin 9.1 (L) 13.0 - 17.0 g/dL   HCT 27.6 (L) 39.0 - 52.0 %   MCV 94.8 80.0 - 100.0 fL   MCH 31.3 26.0 - 34.0 pg   MCHC 33.0 30.0 - 36.0 g/dL   RDW 16.6 (H) 11.5 - 15.5 %   Platelets 125 (L) 150 -  400 K/uL   nRBC 0.0 0.0 - 0.2 %  Basic metabolic panel     Status: Abnormal   Collection Time: 11/22/19  8:47 AM  Result Value Ref Range   Sodium 133 (L) 135 - 145 mmol/L   Potassium 4.1 3.5 - 5.1 mmol/L   Chloride 106 98 - 111 mmol/L   CO2 21 (L) 22 - 32 mmol/L   Glucose, Bld 199 (H) 70 - 99 mg/dL   BUN 30 (H) 8 - 23 mg/dL   Creatinine, Ser 1.74 (H) 0.61 - 1.24 mg/dL   Calcium 7.8 (L) 8.9 - 10.3 mg/dL   GFR calc non Af Amer 39 (L) >60 mL/min   GFR calc Af Amer 45 (L) >60 mL/min   Anion gap 6 5 - 15    Studies/Results: No results found.    Assessment: GI bleed from gastric ulcer appears stable at this time.  Plan:   Continue current medications.  Per Dr. Osborn Coho note yesterday he thinks anticoagulation can be resumed tomorrow. Continue PPI therapy. Check on biopsy.    SAM F Dhanush Jokerst 11/22/2019, 10:46  AM  Pager: [phone] If no answer or after 5 PM call [phone] Lab Results  Component Value Date   HGB 9.1 (L) 11/22/2019   HGB 10.5 (L) 11/21/2019   HGB 9.1 (L) 11/20/2019   HCT 27.6 (L) 11/22/2019   HCT 31.1 (L) 11/21/2019   HCT 28.1 (L) 11/20/2019   ALKPHOS 167 (H) 11/20/2019   ALKPHOS 191 (H) 11/15/2019   ALKPHOS 91 10/26/2019   AST 138 (H) 11/20/2019   AST 29 11/15/2019   AST 65 (H) 10/26/2019   ALT 826 (H) 11/20/2019   ALT 61 (H) 11/15/2019   ALT 46 (H) 10/26/2019

## 2019-11-22 NOTE — Plan of Care (Signed)

## 2019-11-22 NOTE — Progress Notes (Signed)
Patient ambulated 400 feet with walker and no oxygen.   Sats dropped to 73% at the very end of the walk.  Oxygen reapplied

## 2019-11-22 NOTE — Progress Notes (Signed)
Patient refused morning labs.  On-call MD Georgette Shell, MD) made aware.  Patient educated on the importance of labs. Will continue to reinforce education.

## 2019-11-23 ENCOUNTER — Ambulatory Visit: Payer: PPO | Admitting: Cardiology

## 2019-11-23 LAB — RENAL FUNCTION PANEL
Albumin: 1.9 g/dL — ABNORMAL LOW (ref 3.5–5.0)
Anion gap: 8 (ref 5–15)
BUN: 25 mg/dL — ABNORMAL HIGH (ref 8–23)
CO2: 22 mmol/L (ref 22–32)
Calcium: 7.8 mg/dL — ABNORMAL LOW (ref 8.9–10.3)
Chloride: 104 mmol/L (ref 98–111)
Creatinine, Ser: 1.48 mg/dL — ABNORMAL HIGH (ref 0.61–1.24)
GFR calc Af Amer: 54 mL/min — ABNORMAL LOW (ref >60)
GFR calc non Af Amer: 47 mL/min — ABNORMAL LOW (ref >60)
Glucose, Bld: 107 mg/dL — ABNORMAL HIGH (ref 70–99)
Phosphorus: 3.5 mg/dL (ref 2.5–4.6)
Potassium: 3.9 mmol/L (ref 3.5–5.1)
Sodium: 134 mmol/L — ABNORMAL LOW (ref 135–145)

## 2019-11-23 LAB — CBC
HCT: 27 % — ABNORMAL LOW (ref 39.0–52.0)
Hemoglobin: 9 g/dL — ABNORMAL LOW (ref 13.0–17.0)
MCH: 31.4 pg (ref 26.0–34.0)
MCHC: 33.3 g/dL (ref 30.0–36.0)
MCV: 94.1 fL (ref 80.0–100.0)
Platelets: 123 10*3/uL — ABNORMAL LOW (ref 150–400)
RBC: 2.87 MIL/uL — ABNORMAL LOW (ref 4.22–5.81)
RDW: 16.5 % — ABNORMAL HIGH (ref 11.5–15.5)
WBC: 8.9 10*3/uL (ref 4.0–10.5)
nRBC: 0 % (ref 0.0–0.2)

## 2019-11-23 LAB — GLUCOSE, CAPILLARY
Glucose-Capillary: 86 mg/dL (ref 70–99)
Glucose-Capillary: 139 mg/dL — ABNORMAL HIGH (ref 70–99)
Glucose-Capillary: 118 mg/dL — ABNORMAL HIGH (ref 70–99)
Glucose-Capillary: 118 mg/dL — ABNORMAL HIGH (ref 70–99)

## 2019-11-23 LAB — SURGICAL PATHOLOGY

## 2019-11-23 MED ORDER — FUROSEMIDE 10 MG/ML IJ SOLN
40.0000 mg | Freq: Once | INTRAMUSCULAR | Status: AC
  Administered 2019-11-23: 40 mg via INTRAVENOUS
  Filled 2019-11-23: qty 4

## 2019-11-23 NOTE — Progress Notes (Signed)
[facility] Progress Note  Subjective: Hemoglobin and hematocrit are stable.  The biopsy of his gastric ulcer was positive for H. Pylori. This will need to be treated. Recommend either Prevpac for 14 days or Pylera for 10 days as treatment for H. Pylori.  Objective: Vital signs in last 24 hours: Temp:  [97.5 F (36.4 C)-98.7 F (37.1 C)] 98.4 F (36.9 C) (02/15 1558) Pulse Rate:  [25-107] 89 (02/15 1558) Resp:  [16-28] 18 (02/15 1558) BP: (79-115)/(50-79) 92/56 (02/15 1558) SpO2:  [95 %-100 %] 98 % (02/15 1558) Weight:  [105 kg] 105 kg (02/15 0348) Weight change: -0.8 kg   PE:  Lab Results: Results for orders placed or performed during the hospital encounter of 11/15/19 (from the past 24 hour(s))  Glucose, capillary     Status: Abnormal   Collection Time: 11/22/19  9:33 PM  Result Value Ref Range   Glucose-Capillary 126 (H) 70 - 99 mg/dL   Comment 1 Notify RN    Comment 2 Document in Chart   CBC     Status: Abnormal   Collection Time: 11/23/19  1:39 AM  Result Value Ref Range   WBC 8.9 4.0 - 10.5 K/uL   RBC 2.87 (L) 4.22 - 5.81 MIL/uL   Hemoglobin 9.0 (L) 13.0 - 17.0 g/dL   HCT 27.0 (L) 39.0 - 52.0 %   MCV 94.1 80.0 - 100.0 fL   MCH 31.4 26.0 - 34.0 pg   MCHC 33.3 30.0 - 36.0 g/dL   RDW 16.5 (H) 11.5 - 15.5 %   Platelets 123 (L) 150 - 400 K/uL   nRBC 0.0 0.0 - 0.2 %  Renal function panel     Status: Abnormal   Collection Time: 11/23/19  1:39 AM  Result Value Ref Range   Sodium 134 (L) 135 - 145 mmol/L   Potassium 3.9 3.5 - 5.1 mmol/L   Chloride 104 98 - 111 mmol/L   CO2 22 22 - 32 mmol/L   Glucose, Bld 107 (H) 70 - 99 mg/dL   BUN 25 (H) 8 - 23 mg/dL   Creatinine, Ser 1.48 (H) 0.61 - 1.24 mg/dL   Calcium 7.8 (L) 8.9 - 10.3 mg/dL   Phosphorus 3.5 2.5 - 4.6 mg/dL   Albumin 1.9 (L) 3.5 - 5.0 g/dL   GFR calc non Af Amer 47 (L) >60 mL/min   GFR calc Af Amer 54 (L) >60 mL/min   Anion gap 8 5 - 15  Glucose, capillary     Status: None   Collection Time:  11/23/19  6:23 AM  Result Value Ref Range   Glucose-Capillary 86 70 - 99 mg/dL   Comment 1 Notify RN    Comment 2 Document in Chart   Glucose, capillary     Status: Abnormal   Collection Time: 11/23/19 10:48 AM  Result Value Ref Range   Glucose-Capillary 139 (H) 70 - 99 mg/dL    Studies/Results: No results found.    Assessment: Gastric ulcer. H. Pylori positive  Plan:   The H. Pylori should be treated. Recommend either Prevpac for 14 days or Pylera for 10 days. He could get a prescription for this at discharge. He will need to follow-up with Dr. Cristina Gong in a few weeks after discharge. We will sign off for now. Call us if needed.    Cassell Clement 11/23/2019, 4:31 PM  Pager: [phone] If no answer or after 5 PM call [phone] Lab Results  Component Value Date   HGB 9.0 (L)  11/23/2019   HGB 9.1 (L) 11/22/2019   HGB 10.5 (L) 11/21/2019   HCT 27.0 (L) 11/23/2019   HCT 27.6 (L) 11/22/2019   HCT 31.1 (L) 11/21/2019   ALKPHOS 167 (H) 11/20/2019   ALKPHOS 191 (H) 11/15/2019   ALKPHOS 91 10/26/2019   AST 138 (H) 11/20/2019   AST 29 11/15/2019   AST 65 (H) 10/26/2019   ALT 826 (H) 11/20/2019   ALT 61 (H) 11/15/2019   ALT 46 (H) 10/26/2019

## 2019-11-23 NOTE — Progress Notes (Signed)
Ref: [name], MD  Subjective:  Awake. Some shortness of breath. He is asking for discharge as soon as possible.  Discussed need for anticoagulation as well as risk associated. HR and BP are stable and Creatinine appears to be at baseline with mild fluid overload. Gastric ulcer biopsy is positive for H. Pylori.   Objective:  Vital Signs in the last 24 hours: Temp:  [97.5 F (36.4 C)-98.7 F (37.1 C)] 97.5 F (36.4 C) (02/15 1952) Pulse Rate:  [46-107] 88 (02/15 1952) Cardiac Rhythm: Atrial fibrillation (02/15 1956) Resp:  [16-27] 27 (02/15 1952) BP: (92-115)/(50-79) 112/72 (02/15 1952) SpO2:  [96 %-100 %] 99 % (02/15 1952) Weight:  [105 kg] 105 kg (02/15 0348)  Physical Exam: BP Readings from Last 1 Encounters:  11/23/19 112/72     Wt Readings from Last 1 Encounters:  11/23/19 105 kg    Weight change: -0.8 kg Body mass index is 30.54 kg/m. HEENT: [place]/AT, Eyes-Blue, PERL, EOMI, Conjunctiva-Pink, Sclera-Non-icteric Neck: No JVD, No bruit, Trachea midline. Lungs:  Crackles, Bilateral. Cardiac:  Irregular rhythm, normal S1 and S2, no S3. II/VI systolic murmur. Abdomen:  Soft, non-tender. BS present. Extremities:  No edema present. No cyanosis. No clubbing. CNS: AxOx3, Cranial nerves grossly intact, moves all 4 extremities.  Skin: Warm and dry.   Intake/Output from previous day: 02/14 0701 - 02/15 0700 In: 240 [P.O.:240] Out: 1925 [Urine:1925]    Lab Results: BMET    Component Value Date/Time   NA 134 (L) 11/23/2019 0139   NA 133 (L) 11/22/2019 0847   NA 136 11/21/2019 0234   K 3.9 11/23/2019 0139   K 4.1 11/22/2019 0847   K 5.2 (H) 11/21/2019 0234   CL 104 11/23/2019 0139   CL 106 11/22/2019 0847   CL 108 11/21/2019 0234   CO2 22 11/23/2019 0139   CO2 21 (L) 11/22/2019 0847   CO2 17 (L) 11/21/2019 0234   GLUCOSE 107 (H) 11/23/2019 0139   GLUCOSE 199 (H) 11/22/2019 0847   GLUCOSE 102 (H) 11/21/2019 0234   BUN 25 (H) 11/23/2019 0139   BUN 30 (H)  11/22/2019 0847   BUN 51 (H) 11/21/2019 0234   CREATININE 1.48 (H) 11/23/2019 0139   CREATININE 1.74 (H) 11/22/2019 0847   CREATININE 1.97 (H) 11/21/2019 0234   CALCIUM 7.8 (L) 11/23/2019 0139   CALCIUM 7.8 (L) 11/22/2019 0847   CALCIUM 8.2 (L) 11/21/2019 0234   GFRNONAA 47 (L) 11/23/2019 0139   GFRNONAA 39 (L) 11/22/2019 0847   GFRNONAA 33 (L) 11/21/2019 0234   GFRAA 54 (L) 11/23/2019 0139   GFRAA 45 (L) 11/22/2019 0847   GFRAA 38 (L) 11/21/2019 0234   CBC    Component Value Date/Time   WBC 8.9 11/23/2019 0139   RBC 2.87 (L) 11/23/2019 0139   HGB 9.0 (L) 11/23/2019 0139   HCT 27.0 (L) 11/23/2019 0139   PLT 123 (L) 11/23/2019 0139   MCV 94.1 11/23/2019 0139   MCH 31.4 11/23/2019 0139   MCHC 33.3 11/23/2019 0139   RDW 16.5 (H) 11/23/2019 0139   LYMPHSABS 0.9 11/16/2019 0241   MONOABS 0.8 11/16/2019 0241   EOSABS 0.0 11/16/2019 0241   BASOSABS 0.1 11/16/2019 0241   HEPATIC Function Panel Recent Labs    10/26/19 1913 11/15/19 1321 11/20/19 0109  PROT 6.1* 5.8* 4.2*   HEMOGLOBIN A1C No components found for: HGA1C,  MPG CARDIAC ENZYMES No results found for: CKTOTAL, CKMB, CKMBINDEX, TROPONINI BNP No results for input(s): PROBNP in the last 8760 hours.  TSH Recent Labs    11/15/19 1700  TSH 1.647   CHOLESTEROL No results for input(s): CHOL in the last 8760 hours.  Scheduled Meds: . sodium chloride   Intravenous Once  . amoxicillin-clavulanate  1 tablet Oral Q12H  . vitamin C  250 mg Oral Daily  . atorvastatin  10 mg Oral q1800  . diltiazem  30 mg Oral Q6H  . Gerhardt's butt cream   Topical TID  . insulin aspart  0-15 Units Subcutaneous TID WC  . insulin glargine  6 Units Subcutaneous QHS  . metoprolol tartrate  25 mg Oral BID  . multivitamin with minerals  1 tablet Oral Daily  . omega-3 acid ethyl esters  1 g Oral Daily  . pantoprazole (PROTONIX) IV  40 mg Intravenous Q24H  . sodium chloride flush  3 mL Intravenous Q12H  . sucralfate  1 g Oral TID WC & HS    Continuous Infusions: . sodium chloride     PRN Meds:.acetaminophen, guaiFENesin-dextromethorphan, HYDROmorphone (DILAUDID) injection, metoprolol tartrate, Muscle Rub, phenol, sodium chloride, sodium chloride flush  Assessment/Plan: Acute upper GI bleed with multiple gastric ulcer H. Pylori infection of stomach Acute systolic left heart failure Moderate right heart systolic dysfunction CKD, III S/P left nephrectomy Obesity Recent COVID pneumonia with pulmonary fibrosis CAD Right popliteal DVT Weakness Anemia of blood loss  IV lasix x one. Resume IV heparin without bolus in AM. 14 days of Prevpac.   LOS: 8 days   Time spent including chart review, lab review, examination, discussion with nurse, patient, wife and daughter : 29 min   Dixie Dials  MD  11/23/2019, 9:17 PM

## 2019-11-23 NOTE — Progress Notes (Addendum)
[facility] Progress Note    Assessment/ Plan:   1.AKI on CKD IIIa, nonoliguric, solitary kidney: Resolved, hemodynamically mediated. Cr 1.8 >2.75 >>1.74>1.48 today. Baseline 1.4-1.6. Mild hypervolemia on exam. May use oral Lasix/fluid restriction as needed for volume excess.    2. Mild hyponatremia: Na 134. Cont to monitor, consider fluid restriction.   3. UGIB: Resolved s/p ulcer clip on 2/1. Stable, Hgb 9.0 this am, stable. Per GI.    4. Acute hypoxemic respiratory failure  Recent COVID PNA: Improving, O/p f/u with PCCM.   5. 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 Restarting anticoagulation today, 2/15, per cardiology primary/GI.   6. Mild Hyperkalemia: Resolved.  K 3.9 this morning.   7. T2DM: A1c 7.3. SSI per primary.    Nephrology team will sign off at this time. Please call if any questions or concerns. Outpatient follow up will be scheduled.   Subjective:   No acute events overnight.  Feeling okay this morning.  Still feeling a little short of breath, feels deconditioned.   Objective:   BP (!) 115/57 (BP Location: Left Arm)   Pulse 72   Temp 98.1 F (36.7 C) (Oral)   Resp (!) 23   Ht 6\' 1"  (1.854 m)   Wt 105 kg   SpO2 96%   BMI 30.54 kg/m   Intake/Output Summary (Last 24 hours) at 11/23/2019 0809 Last data filed at 11/23/2019 0600 Gross per 24 hour  Intake 120 ml  Output 1725 ml  Net -1605 ml   Weight change: -0.8 kg  Physical Exam: General: Chronically ill-appearing older gentleman, no acute distress, resting comfortably Cardiac: Irregularly irregular, tachycardic without murmurs appreciated Lungs: Clear to auscultation bilaterally, unlabored breathing, on 1L nasal cannula satting appropriately Abdomen: Soft Extremities: Warm, dry, 1+ pitting edema to BLE at midshin level  Imaging: No results found.  Labs: BMET Recent Labs  Lab 11/17/19 0204 11/18/19 0209 11/19/19 0215  11/20/19 0109 11/21/19 0234 11/22/19 0847 11/23/19 0139  NA 134* 136 138 136 136 133* 134*  K 4.9 5.3* 5.6* 5.0 5.2* 4.1 3.9  CL 104 105 111 111 108 106 104  CO2 18* 22 21* 21* 17* 21* 22  GLUCOSE 151* 165* 166* 133* 102* 199* 107*  BUN 55* 51* 60* 71* 51* 30* 25*  CREATININE 2.69* 2.27* 2.17* 1.90* 1.97* 1.74* 1.48*  CALCIUM 8.0* 8.5* 7.7* 7.8* 8.2* 7.8* 7.8*  PHOS  --   --   --   --  3.5  --  3.5   CBC Recent Labs  Lab 11/20/19 0109 11/21/19 0258 11/22/19 0847 11/23/19 0139  WBC 13.5* 15.5* 12.1* 8.9  HGB 9.1* 10.5* 9.1* 9.0*  HCT 28.1* 31.1* 27.6* 27.0*  MCV 95.3 92.0 94.8 94.1  PLT 144* 139* 125* 123*    Medications:    . sodium chloride   Intravenous Once  . amoxicillin-clavulanate  1 tablet Oral Q12H  . vitamin C  250 mg Oral Daily  . atorvastatin  10 mg Oral q1800  . diltiazem  30 mg Oral Q6H  . Gerhardt's butt cream   Topical TID  . insulin aspart  0-15 Units Subcutaneous TID WC  . insulin glargine  6 Units Subcutaneous QHS  . metoprolol tartrate  25 mg Oral BID  . multivitamin with minerals  1 tablet Oral Daily  . omega-3 acid ethyl esters  1 g Oral Daily  . pantoprazole (PROTONIX) IV  40 mg Intravenous Q24H  . sodium chloride flush  3 mL Intravenous Q12H  . sucralfate  1 g Oral TID WC & HS     Patriciaann Clan, DO  Family Medicine PGY-2  11/23/2019, 8:09 AM   I have seen and examined this patient and agree with plan and assessment in the above note with renal recommendations/intervention highlighted.  Scr now back to baseline.  Nothing further to add.  Will sign off.  Please call with questions or concerns.  Will arrange for follow up at our office after discharge. Broadus John A Holbert Caples,MD 11/23/2019 3:11 PM

## 2019-11-23 NOTE — Progress Notes (Signed)
Encouraged to help him out of bed and sit in a chair, but refused. Explained the importance of getting out of bed.

## 2019-11-24 DIAGNOSIS — I4891 Unspecified atrial fibrillation: Secondary | ICD-10-CM | POA: Diagnosis not present

## 2019-11-24 DIAGNOSIS — I82401 Acute embolism and thrombosis of unspecified deep veins of right lower extremity: Secondary | ICD-10-CM | POA: Diagnosis not present

## 2019-11-24 DIAGNOSIS — R931 Abnormal findings on diagnostic imaging of heart and coronary circulation: Secondary | ICD-10-CM | POA: Diagnosis not present

## 2019-11-24 DIAGNOSIS — U071 COVID-19: Secondary | ICD-10-CM | POA: Diagnosis not present

## 2019-11-24 LAB — RENAL FUNCTION PANEL
Albumin: 1.9 g/dL — ABNORMAL LOW (ref 3.5–5.0)
Anion gap: 7 (ref 5–15)
BUN: 19 mg/dL (ref 8–23)
CO2: 24 mmol/L (ref 22–32)
Calcium: 7.9 mg/dL — ABNORMAL LOW (ref 8.9–10.3)
Chloride: 103 mmol/L (ref 98–111)
Creatinine, Ser: 1.58 mg/dL — ABNORMAL HIGH (ref 0.61–1.24)
GFR calc Af Amer: 50 mL/min — ABNORMAL LOW (ref >60)
GFR calc non Af Amer: 43 mL/min — ABNORMAL LOW (ref >60)
Glucose, Bld: 102 mg/dL — ABNORMAL HIGH (ref 70–99)
Phosphorus: 2.9 mg/dL (ref 2.5–4.6)
Potassium: 3.8 mmol/L (ref 3.5–5.1)
Sodium: 134 mmol/L — ABNORMAL LOW (ref 135–145)

## 2019-11-24 LAB — CBC
HCT: 29.1 % — ABNORMAL LOW (ref 39.0–52.0)
Hemoglobin: 9.4 g/dL — ABNORMAL LOW (ref 13.0–17.0)
MCH: 31 pg (ref 26.0–34.0)
MCHC: 32.3 g/dL (ref 30.0–36.0)
MCV: 96 fL (ref 80.0–100.0)
Platelets: 163 10*3/uL (ref 150–400)
RBC: 3.03 MIL/uL — ABNORMAL LOW (ref 4.22–5.81)
RDW: 16.4 % — ABNORMAL HIGH (ref 11.5–15.5)
WBC: 9.5 10*3/uL (ref 4.0–10.5)
nRBC: 0 % (ref 0.0–0.2)

## 2019-11-24 LAB — GLUCOSE, CAPILLARY
Glucose-Capillary: 83 mg/dL (ref 70–99)
Glucose-Capillary: 182 mg/dL — ABNORMAL HIGH (ref 70–99)
Glucose-Capillary: 134 mg/dL — ABNORMAL HIGH (ref 70–99)
Glucose-Capillary: 91 mg/dL (ref 70–99)

## 2019-11-24 LAB — HEPATIC FUNCTION PANEL
ALT: 225 U/L — ABNORMAL HIGH (ref 0–44)
AST: 52 U/L — ABNORMAL HIGH (ref 15–41)
Albumin: 1.9 g/dL — ABNORMAL LOW (ref 3.5–5.0)
Alkaline Phosphatase: 158 U/L — ABNORMAL HIGH (ref 38–126)
Bilirubin, Direct: 0.5 mg/dL — ABNORMAL HIGH (ref 0.0–0.2)
Indirect Bilirubin: 1 mg/dL — ABNORMAL HIGH (ref 0.3–0.9)
Total Bilirubin: 1.5 mg/dL — ABNORMAL HIGH (ref 0.3–1.2)
Total Protein: 4.8 g/dL — ABNORMAL LOW (ref 6.5–8.1)

## 2019-11-24 LAB — HEPARIN LEVEL (UNFRACTIONATED): Heparin Unfractionated: 0.18 IU/mL — ABNORMAL LOW (ref 0.30–0.70)

## 2019-11-24 MED ORDER — POTASSIUM CHLORIDE ER 10 MEQ PO TBCR
10.0000 meq | EXTENDED_RELEASE_TABLET | Freq: Every day | ORAL | Status: DC
  Administered 2019-11-24 – 2019-11-26 (×3): 10 meq via ORAL
  Filled 2019-11-24 (×6): qty 1

## 2019-11-24 MED ORDER — AMOXICILLIN 500 MG PO CAPS
1000.0000 mg | ORAL_CAPSULE | Freq: Two times a day (BID) | ORAL | Status: DC
  Administered 2019-11-24 – 2019-11-26 (×4): 1000 mg via ORAL
  Filled 2019-11-24 (×6): qty 2

## 2019-11-24 MED ORDER — METOPROLOL TARTRATE 12.5 MG HALF TABLET
12.5000 mg | ORAL_TABLET | Freq: Four times a day (QID) | ORAL | Status: DC
  Administered 2019-11-24 – 2019-11-25 (×5): 12.5 mg via ORAL
  Filled 2019-11-24 (×5): qty 1

## 2019-11-24 MED ORDER — CLARITHROMYCIN 500 MG PO TABS
500.0000 mg | ORAL_TABLET | Freq: Two times a day (BID) | ORAL | Status: DC
  Administered 2019-11-24 – 2019-11-26 (×5): 500 mg via ORAL
  Filled 2019-11-24 (×6): qty 1

## 2019-11-24 MED ORDER — FUROSEMIDE 40 MG PO TABS
40.0000 mg | ORAL_TABLET | ORAL | Status: DC
  Administered 2019-11-25: 40 mg via ORAL
  Filled 2019-11-24: qty 1

## 2019-11-24 MED ORDER — PANTOPRAZOLE SODIUM 40 MG PO TBEC
40.0000 mg | DELAYED_RELEASE_TABLET | Freq: Two times a day (BID) | ORAL | Status: DC
  Administered 2019-11-24 – 2019-11-26 (×4): 40 mg via ORAL
  Filled 2019-11-24 (×4): qty 1

## 2019-11-24 MED ORDER — ALBUMIN HUMAN 25 % IV SOLN
12.5000 g | Freq: Once | INTRAVENOUS | Status: AC
  Administered 2019-11-24: 12.5 g via INTRAVENOUS
  Filled 2019-11-24: qty 50

## 2019-11-24 MED ORDER — HEPARIN (PORCINE) 25000 UT/250ML-% IV SOLN
1500.0000 [IU]/h | INTRAVENOUS | Status: DC
  Administered 2019-11-24 – 2019-11-26 (×3): 1400 [IU]/h via INTRAVENOUS
  Filled 2019-11-24 (×3): qty 250

## 2019-11-24 NOTE — Progress Notes (Signed)
Ref: [name], MD   Subjective:  Awake. Mild respiratory distress. No chest pain. BP is running soft with very low albumin level. Holding Hgb and creatinine.  Prevpac 14 days started for H. Pylori infection.  Objective:  Vital Signs in the last 24 hours: Temp:  [97.5 F (36.4 C)-98.7 F (37.1 C)] 98.7 F (37.1 C) (02/16 0749) Pulse Rate:  [46-112] 74 (02/16 0830) Cardiac Rhythm: Atrial fibrillation;Bundle branch block (02/16 0700) Resp:  [16-38] 22 (02/16 0830) BP: (81-118)/(44-78) 114/60 (02/16 0830) SpO2:  [95 %-99 %] 97 % (02/16 0830) Weight:  [103.9 kg] 103.9 kg (02/16 0403)  Physical Exam: BP Readings from Last 1 Encounters:  11/24/19 114/60     Wt Readings from Last 1 Encounters:  11/24/19 103.9 kg    Weight change: -1.1 kg Body mass index is 30.22 kg/m. HEENT: [place]/AT, Eyes-Blue, PERL, EOMI, Conjunctiva-Pale pink, Sclera-Non-icteric Neck: No JVD, No bruit, Trachea midline. Lungs:  Clearing, Bilateral. Cardiac:  Irregular rhythm, normal S1 and S2, no S3. II/VI systolic murmur. Abdomen:  Soft, non-tender. BS present. Extremities:  No edema present. No cyanosis. No clubbing. CNS: AxOx3, Cranial nerves grossly intact, moves all 4 extremities.  Skin: Warm and dry.   Intake/Output from previous day: 02/15 0701 - 02/16 0700 In: 240 [P.O.:240] Out: 1600 [Urine:1600]    Lab Results: BMET    Component Value Date/Time   NA 134 (L) 11/24/2019 0224   NA 134 (L) 11/23/2019 0139   NA 133 (L) 11/22/2019 0847   K 3.8 11/24/2019 0224   K 3.9 11/23/2019 0139   K 4.1 11/22/2019 0847   CL 103 11/24/2019 0224   CL 104 11/23/2019 0139   CL 106 11/22/2019 0847   CO2 24 11/24/2019 0224   CO2 22 11/23/2019 0139   CO2 21 (L) 11/22/2019 0847   GLUCOSE 102 (H) 11/24/2019 0224   GLUCOSE 107 (H) 11/23/2019 0139   GLUCOSE 199 (H) 11/22/2019 0847   BUN 19 11/24/2019 0224   BUN 25 (H) 11/23/2019 0139   BUN 30 (H) 11/22/2019 0847   CREATININE 1.58 (H) 11/24/2019 0224   CREATININE 1.48 (H) 11/23/2019 0139   CREATININE 1.74 (H) 11/22/2019 0847   CALCIUM 7.9 (L) 11/24/2019 0224   CALCIUM 7.8 (L) 11/23/2019 0139   CALCIUM 7.8 (L) 11/22/2019 0847   GFRNONAA 43 (L) 11/24/2019 0224   GFRNONAA 47 (L) 11/23/2019 0139   GFRNONAA 39 (L) 11/22/2019 0847   GFRAA 50 (L) 11/24/2019 0224   GFRAA 54 (L) 11/23/2019 0139   GFRAA 45 (L) 11/22/2019 0847   CBC    Component Value Date/Time   WBC 9.5 11/24/2019 0224   RBC 3.03 (L) 11/24/2019 0224   HGB 9.4 (L) 11/24/2019 0224   HCT 29.1 (L) 11/24/2019 0224   PLT 163 11/24/2019 0224   MCV 96.0 11/24/2019 0224   MCH 31.0 11/24/2019 0224   MCHC 32.3 11/24/2019 0224   RDW 16.4 (H) 11/24/2019 0224   LYMPHSABS 0.9 11/16/2019 0241   MONOABS 0.8 11/16/2019 0241   EOSABS 0.0 11/16/2019 0241   BASOSABS 0.1 11/16/2019 0241   HEPATIC Function Panel Recent Labs    11/15/19 1321 11/20/19 0109 11/24/19 0224  PROT 5.8* 4.2* 4.8*   HEMOGLOBIN A1C No components found for: HGA1C,  MPG CARDIAC ENZYMES No results found for: CKTOTAL, CKMB, CKMBINDEX, TROPONINI BNP No results for input(s): PROBNP in the last 8760 hours. TSH Recent Labs    11/15/19 1700  TSH 1.647   CHOLESTEROL No results for input(s): CHOL in  the last 8760 hours.  Scheduled Meds: . sodium chloride   Intravenous Once  . amoxicillin-clavulanate  1 tablet Oral Q12H  . vitamin C  250 mg Oral Daily  . atorvastatin  10 mg Oral q1800  . diltiazem  30 mg Oral Q6H  . [START ON 11/25/2019] furosemide  40 mg Oral Q M,W,F  . Gerhardt's butt cream   Topical TID  . insulin aspart  0-15 Units Subcutaneous TID WC  . insulin glargine  6 Units Subcutaneous QHS  . metoprolol tartrate  12.5 mg Oral QID  . multivitamin with minerals  1 tablet Oral Daily  . omega-3 acid ethyl esters  1 g Oral Daily  . pantoprazole (PROTONIX) IV  40 mg Intravenous Q24H  . potassium chloride  10 mEq Oral Daily  . sodium chloride flush  3 mL Intravenous Q12H  . sucralfate  1 g Oral TID  WC & HS   Continuous Infusions: . albumin human    . sodium chloride     PRN Meds:.acetaminophen, guaiFENesin-dextromethorphan, HYDROmorphone (DILAUDID) injection, metoprolol tartrate, Muscle Rub, phenol, sodium chloride, sodium chloride flush  Assessment/Plan: Acute upper GI bleed with multiple gastric ulcer H pylori infection of stomach Acute systolic heart failure Moderate right heart systolic dysfunction CKD, III S/P left nephrectomy for renal cell carcinoma CAD Right Popliteal DVT Weakness Anemia of blood loss Recent COVID pneumonia with pulmonary fibrosis Obesity  IV albumin x 1. Prevpac for H. Pylori. IV heparin for atrial fibrillation and DVT    LOS: 9 days   Time spent including chart review, lab review, examination, discussion with patient and nurse : 30 min   Dixie Dials  MD  11/24/2019, 9:25 AM

## 2019-11-24 NOTE — Plan of Care (Signed)

## 2019-11-25 LAB — COMPREHENSIVE METABOLIC PANEL
ALT: 167 U/L — ABNORMAL HIGH (ref 0–44)
AST: 50 U/L — ABNORMAL HIGH (ref 15–41)
Albumin: 2 g/dL — ABNORMAL LOW (ref 3.5–5.0)
Alkaline Phosphatase: 148 U/L — ABNORMAL HIGH (ref 38–126)
Anion gap: 8 (ref 5–15)
BUN: 17 mg/dL (ref 8–23)
CO2: 25 mmol/L (ref 22–32)
Calcium: 7.7 mg/dL — ABNORMAL LOW (ref 8.9–10.3)
Chloride: 101 mmol/L (ref 98–111)
Creatinine, Ser: 1.4 mg/dL — ABNORMAL HIGH (ref 0.61–1.24)
GFR calc Af Amer: 58 mL/min — ABNORMAL LOW (ref >60)
GFR calc non Af Amer: 50 mL/min — ABNORMAL LOW (ref >60)
Glucose, Bld: 97 mg/dL (ref 70–99)
Potassium: 4.1 mmol/L (ref 3.5–5.1)
Sodium: 134 mmol/L — ABNORMAL LOW (ref 135–145)
Total Bilirubin: 1.6 mg/dL — ABNORMAL HIGH (ref 0.3–1.2)
Total Protein: 4.7 g/dL — ABNORMAL LOW (ref 6.5–8.1)

## 2019-11-25 LAB — IRON AND TIBC
Iron: 23 ug/dL — ABNORMAL LOW (ref 45–182)
Saturation Ratios: 12 % — ABNORMAL LOW (ref 17.9–39.5)
TIBC: 188 ug/dL — ABNORMAL LOW (ref 250–450)
UIBC: 165 ug/dL

## 2019-11-25 LAB — CBC
HCT: 27.6 % — ABNORMAL LOW (ref 39.0–52.0)
Hemoglobin: 9 g/dL — ABNORMAL LOW (ref 13.0–17.0)
MCH: 30.8 pg (ref 26.0–34.0)
MCHC: 32.6 g/dL (ref 30.0–36.0)
MCV: 94.5 fL (ref 80.0–100.0)
Platelets: 187 10*3/uL (ref 150–400)
RBC: 2.92 MIL/uL — ABNORMAL LOW (ref 4.22–5.81)
RDW: 16.1 % — ABNORMAL HIGH (ref 11.5–15.5)
WBC: 8.8 10*3/uL (ref 4.0–10.5)
nRBC: 0 % (ref 0.0–0.2)

## 2019-11-25 LAB — GLUCOSE, CAPILLARY
Glucose-Capillary: 85 mg/dL (ref 70–99)
Glucose-Capillary: 175 mg/dL — ABNORMAL HIGH (ref 70–99)
Glucose-Capillary: 135 mg/dL — ABNORMAL HIGH (ref 70–99)
Glucose-Capillary: 93 mg/dL (ref 70–99)

## 2019-11-25 LAB — HEPARIN LEVEL (UNFRACTIONATED): Heparin Unfractionated: 0.33 IU/mL (ref 0.30–0.70)

## 2019-11-25 MED ORDER — DILTIAZEM HCL 60 MG PO TABS
60.0000 mg | ORAL_TABLET | Freq: Two times a day (BID) | ORAL | Status: DC
  Administered 2019-11-25 – 2019-11-26 (×2): 60 mg via ORAL
  Filled 2019-11-25 (×2): qty 1

## 2019-11-25 MED ORDER — DIGOXIN 0.25 MG/ML IJ SOLN
0.2500 mg | Freq: Once | INTRAMUSCULAR | Status: AC
  Administered 2019-11-25: 18:00:00 0.25 mg via INTRAVENOUS
  Filled 2019-11-25: qty 2

## 2019-11-25 MED ORDER — DOCUSATE SODIUM 100 MG PO CAPS
100.0000 mg | ORAL_CAPSULE | Freq: Every day | ORAL | Status: DC
  Administered 2019-11-25 – 2019-11-26 (×2): 100 mg via ORAL
  Filled 2019-11-25 (×2): qty 1

## 2019-11-25 MED ORDER — ALBUMIN HUMAN 25 % IV SOLN
12.5000 g | Freq: Once | INTRAVENOUS | Status: AC
  Administered 2019-11-25: 12:00:00 12.5 g via INTRAVENOUS
  Filled 2019-11-25: qty 50

## 2019-11-25 MED ORDER — CARVEDILOL 6.25 MG PO TABS
6.2500 mg | ORAL_TABLET | Freq: Two times a day (BID) | ORAL | Status: DC
  Administered 2019-11-25 – 2019-11-26 (×2): 6.25 mg via ORAL
  Filled 2019-11-25 (×2): qty 1

## 2019-11-25 NOTE — Plan of Care (Signed)

## 2019-11-25 NOTE — Progress Notes (Signed)
[facility] for heparin Indication: atrial fibrillation/new DVT  Allergies  Allergen Reactions  . Novocain [Procaine] Other (See Comments)    Syncope   . Other     Other reaction(s): passes out    Patient Measurements: Height: 6\' 1"  (185.4 cm) Weight: 231 lb 14.4 oz (105.2 kg) IBW/kg (Calculated) : 79.9 Heparin Dosing Weight: 104kg  Vital Signs: Temp: 97.5 F (36.4 C) (02/17 1244) Temp Source: Oral (02/17 1244) BP: 116/66 (02/17 1244) Pulse Rate: 97 (02/17 1244)  Labs: Recent Labs    11/23/19 0139 11/23/19 0139 11/24/19 0224 11/24/19 2000 11/25/19 0219  HGB 9.0*   < > 9.4*  --  9.0*  HCT 27.0*  --  29.1*  --  27.6*  PLT 123*  --  163  --  187  HEPARINUNFRC  --   --   --  0.18* 0.33  CREATININE 1.48*  --  1.58*  --  1.40*   < > = values in this interval not displayed.    Estimated Creatinine Clearance: 61.6 mL/min (A) (by C-G formula based on SCr of 1.4 mg/dL (H)).  Assessment: 30 YOM presenting with cough, palpitations, now with AFib w/RVR.  Not on anticoagulation PTA.  Heparin was placed on hold for GI bleed last week - restarted 2/16 pm   Heparin level 0.3 at goal heparin drip 1400 uts/hr.  No bleeding noted, cbc has remained stable overnight.   Dopplers 2/9 positive for right age indeterminate dvt in popliteal vein.   Goal of Therapy:  Heparin level 0.3-0.7 units/ml Monitor platelets by anticoagulation protocol: Yes   Plan:  Continue heparin 1400 units/hr Daily heparin level, cbc Follow up transition to oral anticoagulation once procedures are completed   Bonnita Nasuti Pharm.D. CPP, BCPS Clinical Pharmacist [phone] 11/25/2019 1:37 PM

## 2019-11-25 NOTE — Progress Notes (Signed)
Ref: [name], MD   Subjective:  Awake. Mild respiratory distress continues. Monitor shows atrial fibrillation with moderate ventricular response. On antibiotics for H Pylori infection. Hgb and creatinine appear stable for 5 days now and with 3 days of IV heparin.  Objective:  Vital Signs in the last 24 hours: Temp:  [97.4 F (36.3 C)-99.3 F (37.4 C)] 97.4 F (36.3 C) (02/17 1644) Pulse Rate:  [44-112] 101 (02/17 1700) Cardiac Rhythm: Atrial fibrillation (02/17 1320) Resp:  [18-27] 20 (02/17 1700) BP: (83-116)/(47-76) 95/66 (02/17 1700) SpO2:  [95 %-98 %] 97 % (02/17 1700) Weight:  [105.2 kg] 105.2 kg (02/17 0229)  Physical Exam: BP Readings from Last 1 Encounters:  11/25/19 95/66     Wt Readings from Last 1 Encounters:  11/25/19 105.2 kg    Weight change: 1.289 kg Body mass index is 30.6 kg/m. HEENT: /AT, Eyes-Blue, PERL, EOMI, Conjunctiva-Pale pink, Sclera-Non-icteric Neck: No JVD, No bruit, Trachea midline. Lungs:  Clearing, Bilateral. Cardiac:  Irregular rhythm, normal S1 and S2, no S3. II/VI systolic murmur. Abdomen:  Soft, non-tender. BS present. Extremities:  No edema present. No cyanosis. No clubbing. CNS: AxOx3, Cranial nerves grossly intact, moves all 4 extremities.  Skin: Warm and dry.   Intake/Output from previous day: 02/16 0701 - 02/17 0700 In: 740.5 [P.O.:475; I.V.:215.5; IV Piggyback:50] Out: 1475 [Urine:1475]    Lab Results: BMET    Component Value Date/Time   NA 134 (L) 11/25/2019 0219   NA 134 (L) 11/24/2019 0224   NA 134 (L) 11/23/2019 0139   K 4.1 11/25/2019 0219   K 3.8 11/24/2019 0224   K 3.9 11/23/2019 0139   CL 101 11/25/2019 0219   CL 103 11/24/2019 0224   CL 104 11/23/2019 0139   CO2 25 11/25/2019 0219   CO2 24 11/24/2019 0224   CO2 22 11/23/2019 0139   GLUCOSE 97 11/25/2019 0219   GLUCOSE 102 (H) 11/24/2019 0224   GLUCOSE 107 (H) 11/23/2019 0139   BUN 17 11/25/2019 0219   BUN 19 11/24/2019 0224   BUN 25 (H)  11/23/2019 0139   CREATININE 1.40 (H) 11/25/2019 0219   CREATININE 1.58 (H) 11/24/2019 0224   CREATININE 1.48 (H) 11/23/2019 0139   CALCIUM 7.7 (L) 11/25/2019 0219   CALCIUM 7.9 (L) 11/24/2019 0224   CALCIUM 7.8 (L) 11/23/2019 0139   GFRNONAA 50 (L) 11/25/2019 0219   GFRNONAA 43 (L) 11/24/2019 0224   GFRNONAA 47 (L) 11/23/2019 0139   GFRAA 58 (L) 11/25/2019 0219   GFRAA 50 (L) 11/24/2019 0224   GFRAA 54 (L) 11/23/2019 0139   CBC    Component Value Date/Time   WBC 8.8 11/25/2019 0219   RBC 2.92 (L) 11/25/2019 0219   HGB 9.0 (L) 11/25/2019 0219   HCT 27.6 (L) 11/25/2019 0219   PLT 187 11/25/2019 0219   MCV 94.5 11/25/2019 0219   MCH 30.8 11/25/2019 0219   MCHC 32.6 11/25/2019 0219   RDW 16.1 (H) 11/25/2019 0219   LYMPHSABS 0.9 11/16/2019 0241   MONOABS 0.8 11/16/2019 0241   EOSABS 0.0 11/16/2019 0241   BASOSABS 0.1 11/16/2019 0241   HEPATIC Function Panel Recent Labs    11/20/19 0109 11/24/19 0224 11/25/19 0219  PROT 4.2* 4.8* 4.7*   HEMOGLOBIN A1C No components found for: HGA1C,  MPG CARDIAC ENZYMES No results found for: CKTOTAL, CKMB, CKMBINDEX, TROPONINI BNP No results for input(s): PROBNP in the last 8760 hours. TSH Recent Labs    11/15/19 1700  TSH 1.647   CHOLESTEROL No  results for input(s): CHOL in the last 8760 hours.  Scheduled Meds: . sodium chloride   Intravenous Once  . amoxicillin  1,000 mg Oral Q12H  . vitamin C  250 mg Oral Daily  . atorvastatin  10 mg Oral q1800  . carvedilol  6.25 mg Oral BID WC  . clarithromycin  500 mg Oral Q12H  . diltiazem  60 mg Oral Q12H  . docusate sodium  100 mg Oral Daily  . furosemide  40 mg Oral Q M,W,F  . insulin aspart  0-15 Units Subcutaneous TID WC  . insulin glargine  6 Units Subcutaneous QHS  . multivitamin with minerals  1 tablet Oral Daily  . omega-3 acid ethyl esters  1 g Oral Daily  . pantoprazole  40 mg Oral BID  . potassium chloride  10 mEq Oral Daily  . sodium chloride flush  3 mL Intravenous  Q12H  . sucralfate  1 g Oral TID WC & HS   Continuous Infusions: . heparin 1,400 Units/hr (11/25/19 1600)  . sodium chloride     PRN Meds:.acetaminophen, guaiFENesin-dextromethorphan, HYDROmorphone (DILAUDID) injection, metoprolol tartrate, Muscle Rub, phenol, sodium chloride, sodium chloride flush  Assessment/Plan: Acute upper GI bleed with multiple gastric ulcers H.Pylori infection of stomach Acute systolic left heart failure Moderate systolic right heart failure CKD, II S/P left nephrectomy for renal cell carcinoma CAD Right popliteal vein DVT, probably recent Weakness S/P COVID pneumonia with pulmonary fibrosis Obesity Anemia of blood loss  Resume lanoxin for right heart failure and rate control of atrial fibrillation. Increase activity. Patient declined PT.     LOS: 10 days   Time spent including chart review, lab review, examination, discussion with patient : 30 min   Dixie Dials  MD  11/25/2019, 5:34 PM

## 2019-11-25 NOTE — Evaluation (Signed)
Physical Therapy Evaluation Patient Details Name: Melvin Ramirez MRN: [id number] DOB: 24-Oct-1945 Today's Date: 11/25/2019   History of Present Illness  74 years old white male with COVID-19 pneumonia 1 month ago has recurrence of cough and palpitations. He has h/o type 2 DM and left nephrectomy for kidney cancer. His EKG shows atrial fibrillation with RVR. His CXR is positive for bilateral pneumonia consistent with COVD pneumonia. Pt found to have GIB with gastric ulcer, undergoing upper GI endoscopy and gastirc ulcer clip on 2/11.  Clinical Impression  Pt presents to PT with mild deficits in gait, balance, and activity tolerance. Pt with increased drift when ambulating without UE support. Pt also noted with tachycardia during household ambulation distances, although asymptomatic. Pt will benefit from continued acute PT POC to restore independent mobility and community level activity tolerance.    Follow Up Recommendations No PT follow up(pt declines need for PT follow-up)    Equipment Recommendations  None recommended by PT(pt obtaining walking stick from his brother)    Recommendations for Other Services       Precautions / Restrictions Precautions Precautions: Fall Restrictions Weight Bearing Restrictions: No      Mobility  Bed Mobility Overal bed mobility: Independent                Transfers Overall transfer level: Needs assistance Equipment used: None Transfers: Sit to/from Stand Sit to Stand: Supervision            Ambulation/Gait Ambulation/Gait assistance: Supervision Gait Distance (Feet): 200 Feet Assistive device: IV Pole Gait Pattern/deviations: Drifts right/left Gait velocity: functional but reduced Gait velocity interpretation: 1.31 - 2.62 ft/sec, indicative of limited community ambulator General Gait Details: pt with increased drift when ambulating without UE support, drift mitigated by UE support of IV pole  Stairs            Wheelchair  Mobility    Modified Rankin (Stroke Patients Only)       Balance Overall balance assessment: Needs assistance Sitting-balance support: No upper extremity supported;Feet supported Sitting balance-Leahy Scale: Good Sitting balance - Comments: supervision   Standing balance support: Single extremity supported;During functional activity Standing balance-Leahy Scale: Good Standing balance comment: suervision with unilateral UE support of IV pole                             Pertinent Vitals/Pain Pain Assessment: No/denies pain    Home Living Family/patient expects to be discharged to:: Private residence Living Arrangements: Spouse/significant other Available Help at Discharge: Family;Available 24 hours/day Type of Home: House Home Access: Level entry     Home Layout: One level Home Equipment: Other (comment);Walker - 2 wheels;Shower seat;Grab bars - toilet;Grab bars - tub/shower(walking stick or cane from brother)      Prior Function Level of Independence: Independent         Comments: recently returned to work     Hand Dominance   Dominant Hand: Right    Extremity/Trunk Assessment   Upper Extremity Assessment Upper Extremity Assessment: Overall WFL for tasks assessed    Lower Extremity Assessment Lower Extremity Assessment: Overall WFL for tasks assessed    Cervical / Trunk Assessment Cervical / Trunk Assessment: Normal  Communication   Communication: HOH  Cognition Arousal/Alertness: Awake/alert Behavior During Therapy: WFL for tasks assessed/performed Overall Cognitive Status: Within Functional Limits for tasks assessed  General Comments General comments (skin integrity, edema, etc.): pt on RA, pt denies SOB, sats ranging from 91-100%. Pt tachy to 136 with mobility, recovers to low 100s with sitting rest    Exercises     Assessment/Plan    PT Assessment Patient needs continued PT  services  PT Problem List Decreased balance;Decreased activity tolerance;Cardiopulmonary status limiting activity       PT Treatment Interventions DME instruction;Gait training;Functional mobility training;Therapeutic activities;Therapeutic exercise;Neuromuscular re-education;Balance training;Patient/family education    PT Goals (Current goals can be found in the Care Plan section)  Acute Rehab PT Goals Patient Stated Goal: To go home PT Goal Formulation: With patient Time For Goal Achievement: 12/09/19 Potential to Achieve Goals: Good Additional Goals Additional Goal #1: Pt will maintain dynamic standing balance within 10 inches of his base of support with unilateral UE support of the least restrictive assistive device, modI.    Frequency Min 3X/week   Barriers to discharge        Co-evaluation               AM-PAC PT "6 Clicks" Mobility  Outcome Measure Help needed turning from your back to your side while in a flat bed without using bedrails?: None Help needed moving from lying on your back to sitting on the side of a flat bed without using bedrails?: None Help needed moving to and from a bed to a chair (including a wheelchair)?: None Help needed standing up from a chair using your arms (e.g., wheelchair or bedside chair)?: None Help needed to walk in hospital room?: None Help needed climbing 3-5 steps with a railing? : A Little 6 Click Score: 23    End of Session   Activity Tolerance: Patient tolerated treatment well Patient left: in bed;with call bell/phone within reach;with family/visitor present Nurse Communication: Mobility status PT Visit Diagnosis: Unsteadiness on feet (R26.81)    Time: 6389-3734 PT Time Calculation (min) (ACUTE ONLY): 20 min   Charges:   PT Evaluation $PT Eval Moderate Complexity: 1 Mod          Zenaida Niece, PT, DPT Acute Rehabilitation Pager: [phone]   Zenaida Niece 11/25/2019, 3:44 PM

## 2019-11-26 LAB — GLUCOSE, CAPILLARY
Glucose-Capillary: 82 mg/dL (ref 70–99)
Glucose-Capillary: 123 mg/dL — ABNORMAL HIGH (ref 70–99)

## 2019-11-26 LAB — RENAL FUNCTION PANEL
Albumin: 2.1 g/dL — ABNORMAL LOW (ref 3.5–5.0)
Anion gap: 11 (ref 5–15)
BUN: 17 mg/dL (ref 8–23)
CO2: 18 mmol/L — ABNORMAL LOW (ref 22–32)
Calcium: 7.6 mg/dL — ABNORMAL LOW (ref 8.9–10.3)
Chloride: 103 mmol/L (ref 98–111)
Creatinine, Ser: 1.32 mg/dL — ABNORMAL HIGH (ref 0.61–1.24)
GFR calc Af Amer: >60 mL/min
GFR calc non Af Amer: 54 mL/min — ABNORMAL LOW
Glucose, Bld: 97 mg/dL (ref 70–99)
Phosphorus: 2.6 mg/dL (ref 2.5–4.6)
Potassium: 4.1 mmol/L (ref 3.5–5.1)
Sodium: 132 mmol/L — ABNORMAL LOW (ref 135–145)

## 2019-11-26 LAB — HEPARIN LEVEL (UNFRACTIONATED)
Heparin Unfractionated: 0.12 [IU]/mL — ABNORMAL LOW (ref 0.30–0.70)
Heparin Unfractionated: 0.39 IU/mL (ref 0.30–0.70)

## 2019-11-26 LAB — CBC
HCT: 26.9 % — ABNORMAL LOW (ref 39.0–52.0)
Hemoglobin: 8.8 g/dL — ABNORMAL LOW (ref 13.0–17.0)
MCH: 31.4 pg (ref 26.0–34.0)
MCHC: 32.7 g/dL (ref 30.0–36.0)
MCV: 96.1 fL (ref 80.0–100.0)
Platelets: 186 K/uL (ref 150–400)
RBC: 2.8 MIL/uL — ABNORMAL LOW (ref 4.22–5.81)
RDW: 15.9 % — ABNORMAL HIGH (ref 11.5–15.5)
WBC: 8.1 K/uL (ref 4.0–10.5)
nRBC: 0 % (ref 0.0–0.2)

## 2019-11-26 MED ORDER — PANTOPRAZOLE SODIUM 40 MG PO TBEC: 40.0000 mg | DELAYED_RELEASE_TABLET | Freq: Two times a day (BID) | ORAL | 3 refills | Status: DC

## 2019-11-26 MED ORDER — DIGOXIN 125 MCG PO TABS: 0.1250 mg | ORAL_TABLET | Freq: Every day | ORAL | 2 refills | Status: DC

## 2019-11-26 MED ORDER — APIXABAN 5 MG PO TABS: 5.0000 mg | ORAL_TABLET | Freq: Two times a day (BID) | ORAL | 2 refills | Status: DC

## 2019-11-26 MED ORDER — TORSEMIDE 20 MG PO TABS: 10.0000 mg | ORAL_TABLET | Freq: Every day | ORAL | Status: DC

## 2019-11-26 MED ORDER — CARVEDILOL 6.25 MG PO TABS: 6.2500 mg | ORAL_TABLET | Freq: Two times a day (BID) | ORAL | 6 refills | Status: DC

## 2019-11-26 MED ORDER — MUSCLE RUB 10-15 % EX CREA: 1.0000 "application " | TOPICAL_CREAM | CUTANEOUS | 0 refills | Status: DC | PRN

## 2019-11-26 MED ORDER — FERROUS SULFATE 325 (65 FE) MG PO TABS: 325.0000 mg | ORAL_TABLET | Freq: Two times a day (BID) | ORAL | Status: DC

## 2019-11-26 MED ORDER — AMOXICILLIN 500 MG PO CAPS: 1000.0000 mg | ORAL_CAPSULE | Freq: Two times a day (BID) | ORAL | 0 refills | Status: AC

## 2019-11-26 MED ORDER — PHENOL 1.4 % MT LIQD: 1.0000 | OROMUCOSAL | 0 refills | Status: DC | PRN

## 2019-11-26 MED ORDER — INSULIN GLARGINE 100 UNIT/ML ~~LOC~~ SOLN: 6.0000 [IU] | Freq: Every day | SUBCUTANEOUS | 11 refills | Status: DC

## 2019-11-26 MED ORDER — APIXABAN 5 MG PO TABS
5.0000 mg | ORAL_TABLET | Freq: Two times a day (BID) | ORAL | Status: DC
  Administered 2019-11-26: 11:00:00 5 mg via ORAL
  Filled 2019-11-26: qty 1

## 2019-11-26 MED ORDER — CLARITHROMYCIN 500 MG PO TABS: 500.0000 mg | ORAL_TABLET | Freq: Two times a day (BID) | ORAL | 0 refills | Status: DC

## 2019-11-26 MED ORDER — TORSEMIDE 10 MG PO TABS: 10.0000 mg | ORAL_TABLET | Freq: Every day | ORAL | 3 refills | Status: DC

## 2019-11-26 MED ORDER — DIGOXIN 125 MCG PO TABS: 0.1250 mg | ORAL_TABLET | ORAL | Status: DC

## 2019-11-26 MED ORDER — DIGOXIN 125 MCG PO TABS
0.1250 mg | ORAL_TABLET | Freq: Every day | ORAL | Status: DC
  Administered 2019-11-26: 0.125 mg via ORAL
  Filled 2019-11-26: qty 1

## 2019-11-26 MED ORDER — ACETAMINOPHEN 325 MG PO TABS: 650.0000 mg | ORAL_TABLET | Freq: Four times a day (QID) | ORAL | Status: DC | PRN

## 2019-11-26 MED ORDER — DOCUSATE SODIUM 100 MG PO CAPS: 100.0000 mg | ORAL_CAPSULE | Freq: Every day | ORAL | 0 refills | Status: DC

## 2019-11-26 MED ORDER — GUAIFENESIN-DM 100-10 MG/5ML PO SYRP: 5.0000 mL | ORAL_SOLUTION | ORAL | 0 refills | Status: DC | PRN

## 2019-11-26 MED ORDER — DIGOXIN 125 MCG PO TABS: 0.1250 mg | ORAL_TABLET | ORAL | 2 refills | Status: DC

## 2019-11-26 MED ORDER — INSULIN ASPART 100 UNIT/ML ~~LOC~~ SOLN: 0.0000 [IU] | Freq: Three times a day (TID) | SUBCUTANEOUS | 11 refills | Status: DC

## 2019-11-26 MED ORDER — POTASSIUM CHLORIDE ER 10 MEQ PO TBCR: 10.0000 meq | EXTENDED_RELEASE_TABLET | Freq: Every day | ORAL | 3 refills | Status: DC

## 2019-11-26 MED ORDER — SUCRALFATE 1 GM/10ML PO SUSP: 1.0000 g | Freq: Three times a day (TID) | ORAL | 0 refills | Status: DC

## 2019-11-26 NOTE — Progress Notes (Signed)
ANTICOAGULATION CONSULT NOTE - Follow Up Consult  Pharmacy Consult for heparin Indication: Afib and DVT  Labs: Recent Labs    11/24/19 0224 11/24/19 0224 11/24/19 2000 11/25/19 0219 11/26/19 0213  HGB 9.4*   < >  --  9.0* 8.8*  HCT 29.1*  --   --  27.6* 26.9*  PLT 163  --   --  187 186  HEPARINUNFRC  --   --  0.18* 0.33 0.12*  CREATININE 1.58*  --   --  1.40*  --    < > = values in this interval not displayed.    Assessment: 74yo male subtherapeutic on heparin after one level at goal; no gtt issues or signs of bleeding per RN.  Goal of Therapy:  Heparin level 0.3-0.7 units/ml   Plan:  Will increase heparin gtt conservatively to 1500 units/hr and check level in 6 hours.    Wynona Neat, PharmD, BCPS  11/26/2019,3:11 AM

## 2019-11-26 NOTE — Discharge Instructions (Signed)

## 2019-11-26 NOTE — TOC Progression Note (Signed)
Transition of Care [facility]) - Progression Note    Patient Details  Name: Melvin Ramirez MRN: [id number] Date of Birth: [birth year]  Transition of Care [facility]) CM/SW Contact  Zenon Mayo, RN Phone Number: 11/26/2019, 1:28 PM  Clinical Narrative:    Patient is for dc today, NCM informed wife of the copay amt for refills, which is 45.00, NCM contacted the pharmacy where patient is picking up medications and they informed this NCM the apixaban is 45.00.          Expected Discharge Plan and Services           Expected Discharge Date: 11/26/19                                     Social Determinants of Health (SDOH) Interventions    Readmission Risk Interventions No flowsheet data found.

## 2019-11-26 NOTE — Progress Notes (Signed)
Dr. Doylene Canard made aware about the drug interaction between biaxin and digoxin as claimed by [facility] pharmacist.

## 2019-11-26 NOTE — Discharge Summary (Signed)
Physician Discharge Summary  Patient ID: Melvin Ramirez MRN: [id number] DOB/AGE: 01/27/1946 74 y.o.  Admit date: 11/15/2019 Discharge date: 11/26/2019  Admission Diagnoses: Atrial fibrillation with RVR Recent Covid pneumonia Type 2 diabetes mellitus with hyperglycemia CKD, 3 Status post left nephrectomy Obesity  Discharge Diagnoses:  Principle problem: Acute upper GI bleed with multiple gastric ulcers Active Problems:   Atrial fibrillation, recent, CHA2DS2VASc score of  4 Acute systolic left heart failure Moderate systolic right heart failure CKD, II Status post left nephrectomy for renal cell carcinoma H. Pylori infection of stomach Obesity Recent Covid pneumonia with pulmonary fibrosis Coronary artery disease Right popliteal vein DVT, age indeterminate Hypoalbuminemia Iron deficiency anemia Anemia of chronic disease Elevated liver transaminases due to liver congestion  Discharged Condition: stable  Hospital Course: 74 year old white male with COVID-19 pneumonia little over a month ago had recurrence of cough and palpitation he had a past medical history of type 2 diabetes mellitus, CKD 2 with a left nephrectomy for renal cell carcinoma.  In emergency room he is monitor showed atrial fibrillation with a rapid ventricular response.  His thyroid-stimulating hormone level was normal.  He had pulmonary consultation for shortness of breath and recent Covid-19 pneumonia and renal consult for worsening renal function. Pulmonary and renal evaluation with ANA, ANCA antibody, glomerular basement membrane antibody anti-DNA rheumatoid factor Sjogren's syndrome a and B antiscleroderma antibody and cyclic citrulline were essentially unremarkable.  High-resolution CT chest without contrast was positive for pulmonary infiltrates with fibrotic changes but improved from prior scan. With anticoagulant therapy for atrial fibrillation patient had acute GI bleed. GI consultation was obtained, patient  underwent endoscopy and was found to have multiple gastric ulcers which were positive for H. pylori infection.  One of the large gastric ulcer was reduced with the application of a clip.  Patient was started on IV pantoprazole and oral sucralfate.  He was also started on a Prevpac for H. pylori infection.  His IV heparin was resumed after 72 hours of no additional GI bleed and when he tolerated IV heparin for next 72 hours he was placed on oral Eliquis 5 mg twice daily. His renal function improved over 1 week to his baseline CKD, II. Patient was offered cardiac catheterization for reduced LV systolic function and evidence of coronary artery disease on CT scan. Patient is at high risk of bleeding if he needed dual antiplatelet therapy on top of Eliquis. He agreed to postpone the cardiac catheterization for 1 to 4 weeks. He will be followed by me in 1 week, by his primary care physician 2 weeks and by renal doctor in about 1 month.  Consults: pulmonary/intensive care, GI and nephrology. Cardiology.  Significant Diagnostic Studies: labs: CBC near normal except mildly elevated WBC count of 12K. Post GI bleed Hgb down to 10.7 g and 9.1 and stayed around 8.8 to 9 gm. Creatinine was 1.80, increased to 2.75 and slowly declined to 1.32 mg with BUN normalizing. Albumin low at 1.9 to 2.8 g. AST and ALT and Bilirubin chronically and mildly elevated.Hgb A1C is 7.3 % D-dimer was elevated at 1.43. (VQ scan was low probability.) Iron level was low at 23 mcg and saturation was 12 %. ANA, Anti-DNA antibody, ANCA antibody, Rheumatoid factor, Sjogren syndrome A and B, Anti-scleroderma antibody and cyclic citrulline Peptide antibody were not elevated. Sed rate was mildly elevated. Gastric biopsy was positive for H. Pylori infection.  EKG: Atrial fibrillation with RVR. Subsequent EKG was atrial fibrillation with controlled ventricular response and non-specific T  wave changes.  Chest X-ray: COVID pneumonia as before. Repeat  chest x-ray in 2 days was unchanged.  CT chest high resolution: Improving ground glass opacity throughout both lungs, Small bilateral pleural effusions, pulmonary arterial hypertension, CAD, Cholelithiasis, small splenic infarct and aortic atherosclerosis.  Renal Ultrasound: Right renal cyst with normal echo texture. Left nephrectomy with h/o renal cell carcinoma.  Upper GI endoscopy: Multiple gastric ulcer, larger one probably source of bleeding treated with clip placement. Gastric biopsy positive for H. Pylori infection.  Treatments: cardiac meds: atorvastatin, carvedilol, digoxin, potassium and torsemide, anticoagulation: heparin followed by Eliquis, insulin: Lantus and procedures: upper GI endoscopy.  Discharge Exam: Blood pressure (!) 76/56, pulse 67, temperature 98.7 F (37.1 C), temperature source Oral, resp. rate (!) 21, height 6\' 1"  (1.854 m), weight 103.5 kg, SpO2 97 %. General appearance: alert, cooperative and appears stated age. Mild chronic respiratory distress Head: Normocephalic, atraumatic. Eyes: Blue eyes, pale pink conjunctiva, corneas clear. PERRL, EOM's intact.  Neck: No adenopathy, no carotid bruit, no JVD, supple, symmetrical, trachea midline and thyroid not enlarged. Resp: Clear to auscultation bilaterally. Cardio: Irregular rate and rhythm, S1, S2 normal, II/VI systolic murmur, no click, rub or gallop. GI: Soft, non-tender; bowel sounds normal; no organomegaly. Extremities: No edema, cyanosis or clubbing. Skin: Warm and dry.  Neurologic: Alert and oriented X 3, normal strength and tone. Normal coordination and slow gait.  Disposition: Discharge disposition: 01-Home or Self Care        Allergies as of 11/26/2019      Reactions   Novocain [procaine] Other (See Comments)   Syncope   Other    Other reaction(s): passes out      Medication List    STOP taking these medications   tamsulosin 0.4 MG Caps capsule Commonly known as: FLOMAX     TAKE these  medications   acetaminophen 325 MG tablet Commonly known as: TYLENOL Take 2 tablets (650 mg total) by mouth every 6 (six) hours as needed for moderate pain.   amoxicillin 500 MG capsule Commonly known as: AMOXIL Take 2 capsules (1,000 mg total) by mouth every 12 (twelve) hours for 12 days.   apixaban 5 MG Tabs tablet Commonly known as: ELIQUIS Take 1 tablet (5 mg total) by mouth 2 (two) times daily.   atorvastatin 10 MG tablet Commonly known as: LIPITOR Take 10 mg by mouth daily.   carvedilol 6.25 MG tablet Commonly known as: COREG Take 1 tablet (6.25 mg total) by mouth 2 (two) times daily with a meal.   clarithromycin 500 MG tablet Commonly known as: BIAXIN Take 1 tablet (500 mg total) by mouth every 12 (twelve) hours.   digoxin 0.125 MG tablet Commonly known as: LANOXIN Take 1 tablet (0.125 mg total) by mouth daily.   docusate sodium 100 MG capsule Commonly known as: COLACE Take 1 capsule (100 mg total) by mouth daily. Start taking on: November 27, 2019   ferrous sulfate 325 (65 FE) MG tablet Take 1 tablet (325 mg total) by mouth 2 (two) times daily with a meal.   Fish Oil 1000 MG Caps Take 1,000 mg by mouth daily.   Garlic 270 MG Caps as directed   Glucosamine 500 MG Caps as directed   guaiFENesin-dextromethorphan 100-10 MG/5ML syrup Commonly known as: ROBITUSSIN DM Take 5 mLs by mouth every 4 (four) hours as needed for cough.   insulin aspart 100 UNIT/ML injection Commonly known as: novoLOG Inject 0-15 Units into the skin 3 (three) times daily with meals.  insulin glargine 100 UNIT/ML injection Commonly known as: LANTUS Inject 0.06 mLs (6 Units total) into the skin at bedtime.   multivitamin capsule as directed   Muscle Rub 10-15 % Crea Apply 1 application topically as needed for muscle pain.   pantoprazole 40 MG tablet Commonly known as: PROTONIX Take 1 tablet (40 mg total) by mouth 2 (two) times daily.   phenol 1.4 % Liqd Commonly known as:  CHLORASEPTIC Use as directed 1 spray in the mouth or throat as needed for throat irritation / pain.   potassium chloride 10 MEQ tablet Commonly known as: KLOR-CON Take 1 tablet (10 mEq total) by mouth daily. Start taking on: November 27, 2019   Saw Palmetto 500 MG Caps as directed   sucralfate 1 GM/10ML suspension Commonly known as: CARAFATE Take 10 mLs (1 g total) by mouth 4 (four) times daily -  with meals and at bedtime.   torsemide 10 MG tablet Commonly known as: DEMADEX Take 1 tablet (10 mg total) by mouth daily.   vitamin C 100 MG tablet as directed      Follow-up Information    Elmarie Shiley, MD. Go on 12/24/2019.   Specialty: Nephrology Why: Appointment at 2 PM with labs to be done 1 week prior Contact information: [place] [address] [phone]        [name], MD. Schedule an appointment as soon as possible for a visit in 2 week(s).   Specialty: Family Medicine Contact information: [place] [address] [phone]        Dixie Dials, MD. Schedule an appointment as soon as possible for a visit in 1 week(s).   Specialty: Cardiology Contact information: [place] [address] [phone]           Time spent: Review of old chart, current chart, lab, x-ray, cardiac tests and discussion with patient over 60 minutes.  Signed: Birdie Riddle 11/26/2019, 12:47 PM

## 2019-11-26 NOTE — Progress Notes (Signed)
Started teaching on how to give insulin injection. Very receptive with teaching with wife and also claimed they have a family working with paramedic who can help him. Refused to do return demo this time. Wants to go home. Discharge instructions given . Awaiting for dr. Doylene Canard  To explain about the insulin injection since he is not on said med.

## 2019-11-27 LAB — CULTURE, BLOOD (ROUTINE X 2)
Culture: NO GROWTH
Culture: NO GROWTH
Special Requests: ADEQUATE
Special Requests: ADEQUATE

## 2019-11-30 DIAGNOSIS — I420 Dilated cardiomyopathy: Secondary | ICD-10-CM | POA: Diagnosis not present

## 2019-11-30 DIAGNOSIS — I5022 Chronic systolic (congestive) heart failure: Secondary | ICD-10-CM | POA: Diagnosis not present

## 2019-11-30 DIAGNOSIS — I340 Nonrheumatic mitral (valve) insufficiency: Secondary | ICD-10-CM | POA: Diagnosis not present

## 2019-11-30 DIAGNOSIS — I4821 Permanent atrial fibrillation: Secondary | ICD-10-CM | POA: Diagnosis not present

## 2019-12-01 DIAGNOSIS — I4891 Unspecified atrial fibrillation: Secondary | ICD-10-CM | POA: Diagnosis not present

## 2019-12-01 DIAGNOSIS — E7800 Pure hypercholesterolemia, unspecified: Secondary | ICD-10-CM | POA: Diagnosis not present

## 2019-12-01 DIAGNOSIS — R5381 Other malaise: Secondary | ICD-10-CM | POA: Diagnosis not present

## 2019-12-01 DIAGNOSIS — Z8616 Personal history of COVID-19: Secondary | ICD-10-CM | POA: Diagnosis not present

## 2019-12-01 DIAGNOSIS — E1169 Type 2 diabetes mellitus with other specified complication: Secondary | ICD-10-CM | POA: Diagnosis not present

## 2019-12-01 DIAGNOSIS — I5022 Chronic systolic (congestive) heart failure: Secondary | ICD-10-CM | POA: Diagnosis not present

## 2019-12-01 DIAGNOSIS — I82431 Acute embolism and thrombosis of right popliteal vein: Secondary | ICD-10-CM | POA: Diagnosis not present

## 2019-12-01 DIAGNOSIS — A048 Other specified bacterial intestinal infections: Secondary | ICD-10-CM | POA: Diagnosis not present

## 2019-12-01 DIAGNOSIS — Z8719 Personal history of other diseases of the digestive system: Secondary | ICD-10-CM | POA: Diagnosis not present

## 2019-12-03 ENCOUNTER — Other Ambulatory Visit: Payer: Self-pay

## 2019-12-03 ENCOUNTER — Encounter: Payer: Self-pay | Admitting: Cardiovascular Disease

## 2019-12-03 ENCOUNTER — Ambulatory Visit (INDEPENDENT_AMBULATORY_CARE_PROVIDER_SITE_OTHER): Payer: Commercial Managed Care - PPO | Admitting: Cardiovascular Disease

## 2019-12-03 VITALS — BP 100/60 | HR 97 | Ht 74.0 in | Wt 225.0 lb

## 2019-12-03 DIAGNOSIS — I4819 Other persistent atrial fibrillation: Secondary | ICD-10-CM

## 2019-12-03 DIAGNOSIS — I509 Heart failure, unspecified: Secondary | ICD-10-CM

## 2019-12-03 DIAGNOSIS — K250 Acute gastric ulcer with hemorrhage: Secondary | ICD-10-CM | POA: Diagnosis not present

## 2019-12-03 DIAGNOSIS — N183 Chronic kidney disease, stage 3 unspecified: Secondary | ICD-10-CM

## 2019-12-03 DIAGNOSIS — C649 Malignant neoplasm of unspecified kidney, except renal pelvis: Secondary | ICD-10-CM | POA: Diagnosis not present

## 2019-12-03 DIAGNOSIS — I5042 Chronic combined systolic (congestive) and diastolic (congestive) heart failure: Secondary | ICD-10-CM | POA: Diagnosis not present

## 2019-12-03 DIAGNOSIS — E119 Type 2 diabetes mellitus without complications: Secondary | ICD-10-CM

## 2019-12-03 NOTE — Patient Instructions (Addendum)
Medication Instructions:  ON Tuesday 12/08/2019 START TAKING YOUR DIGOXIN DAILY   *If you need a refill on your cardiac medications before your next appointment, please call your pharmacy*  Lab Work: CBC/BMET 12/14/2019 PRIOR TO YOUR COVID SCREENING   If you have labs (blood work) drawn today and your tests are completely normal, you will receive your results only by: Marland Kitchen MyChart Message (if you have MyChart) OR . A paper copy in the mail If you have any lab test that is abnormal or we need to change your treatment, we will call you to review the results.  Testing/Procedures: Your physician has requested that you have a lexiscan myoview. For further information please visit [email or web address]. Please follow instruction sheet, as given.  CARDIOVERSION - BELOW INSTRUCTIONS  Follow-Up: At [facility], you and your health needs are our priority.  As part of our continuing mission to provide you with exceptional heart care, we have created designated Provider Care Teams.  These Care Teams include your primary Cardiologist (physician) and Advanced Practice Providers (APPs -  Physician Assistants and Nurse Practitioners) who all work together to provide you with the care you need, when you need it.  Your next appointment:   4 week(s)  The format for your next appointment:   In Person  Provider:   You may see DR [facility] or one of the following Advanced Practice Providers on your designated Care Team:    Kerin Ransom, PA-C  [place], [place]  Coletta Memos, [place]   Other Instructions  Your provider has recommended a cardioversion.   COVID SCREENING INFORMATION: COVID SCREENING 12/14/2019 12:05  [facility] (old [facility]) [address] Stay in the RIGHT lane and proceed under the brick awning (NOT the tent) and tell them you are there for pre-procedure testing Do NOT bring any pets with you to the testing site  You are scheduled for a cardioversion on  12/17/2019 at 7:30 with Dr. Audie Box or associates. Please go to [facility] ([address]) 2nd [place] Stay at 6:00 AM.  There is free valet parking available.  Enter through the [place] not have any food or drink after midnight NIGHT BEFORE You may take your medicines with a sip of water on the day of your procedure.   Hold the following medicines TORSEMIDE   DO NOT STOP YOUR ANTICOAGULANT (BLOOD THINNER) ELIWUIS - YOU WILL NEED TO CONTINUE YOUR ANTICOAGULANT AFTER YOUR PROCEDURE UNTIL YOU ARE TOLD BY YOUR PROVIDER IT IS SAFE TO STOP  You will need someone to drive you home following your procedure and stay in the waiting room during your procedure. Failure to do so could result in your procedure being cancelled.   Every effort is made to have your procedure done on time. Occasionally there are emergencies that occur at the hospital that may cause delays.   Call the [place] office at [phone] if you have any questions, problems or concerns.     Electrical Cardioversion Electrical cardioversion is the delivery of a jolt of electricity to change the rhythm of the heart. Sticky patches or metal paddles are placed on the chest to deliver the electricity from a device. This is done to restore a normal rhythm. A rhythm that is too fast or not regular keeps the heart from pumping well. Electrical cardioversion is done in an emergency if:   There is low or no blood pressure as a result  of the heart rhythm.    Normal rhythm must be restored as fast as possible to protect the brain and heart from further damage.    It may save a life. Cardioversion may be done for heart rhythms that are not immediately life threatening, such as atrial fibrillation or flutter, in which:   The heart is beating too fast or is not regular.    Medicine to change the rhythm has not worked.    It is safe to wait in order to allow time for  preparation.  Symptoms of the abnormal rhythm are bothersome.  The risk of stroke and other serious problems can be reduced.  LET [facility] CARE PROVIDER KNOW ABOUT:   Any allergies you have.  All medicines you are taking, including vitamins, herbs, eye drops, creams, and over-the-counter medicines.  Previous problems you or members of your family have had with the use of anesthetics.    Any blood disorders you have.    Previous surgeries you have had.    Medical conditions you have.   RISKS AND COMPLICATIONS  Generally, this is a safe procedure. However, problems can occur and include:   Breathing problems related to the anesthetic used.  A blood clot that breaks free and travels to other parts of your body. This could cause a stroke or other problems. The risk of this is lowered by use of blood-thinning medicine (anticoagulant) prior to the procedure.  Cardiac arrest (rare).  BEFORE THE PROCEDURE   You may have tests to detect blood clots in your heart and to evaluate heart function.   You may start taking anticoagulants so your blood does not clot as easily.    Medicines may be given to help stabilize your heart rate and rhythm.   PROCEDURE  You will be given medicine through an IV tube to reduce discomfort and make you sleepy (sedative).    An electrical shock will be delivered.   AFTER THE PROCEDURE Your heart rhythm will be watched to make sure it does not change. You will need someone to drive you home.

## 2019-12-03 NOTE — Progress Notes (Signed)
Cardiology Office Note   Date:  12/13/2019   ID:  Melvin Ramirez, DOB 06-Mar-1946, MRN [id number]  PCP:  [name], MD  Cardiologist:   Skeet Latch, MD   No chief complaint on file.     History of Present Illness: Melvin Ramirez is a 74 y.o. male with paroxysmal atrial fibrillation, chronic systolic and diastolic heart failure, DM, CKD 3, RCC s/p nephrectomy, and obesity here to establish care.  He was recently admitted 11/2027 with GI bleeding and atrial fibrillation in the setting of recent COVID pneumonia.  He had COVID pneumonia approximately 1 month prior to admission.  He subsequently developed palpitations and cough.  His heart rate was in the 160s and he was found to be in atrial fibrillation.  Thyroid function was normal.  He was started on anticoagulation and developed bleeding gastric ulcers that required clipping.  He was found to be positive for H. Pylori.  He was subsequently started on Eliquis.  Echo that admission revealed LVEF 25 to 30% with global hypokinesis.  Hypokinesis was worse than the anterior myocardium.  RV function was mildly reduced.  High sensitivity troponin was mildly elevated to 39 Plans were made for outpatient LHC given his GIB.  During that hospitalization he had LE Doppler that was indeterminate for R popliteal vein.  Melvin Ramirez is still recovering from [place].  He was treated with monoclonal antibodies and lost 40 lb.  He has been struggling to get his strength back.  His BP at home has been 88-112/52-73.  Heart rate has ranged from 53-95.  Lately he hasn't had any lower extremity edema, orthopnea, or PND.  He has no exertional chest pain and his breathing has been slowly improving.   Past Medical History:  Diagnosis Date  . Chronic combined systolic and diastolic heart failure ([place]) 12/13/2019  . Chronic kidney disease   . CKD (chronic kidney disease) stage 3, GFR 30-59 ml/min 12/13/2019  . Diabetes mellitus ([place])   . Gastric ulcer 12/13/2019   +H.  Pylori. Bleeding required clipping.  . H/O blood clots   . History of kidney cancer   . Persistent atrial fibrillation ([place]) 12/13/2019  . Renal cell carcinoma ([place]) 12/13/2019   S/p nephrectomy    Past Surgical History:  Procedure Laterality Date  . APPENDECTOMY    . BIOPSY  11/19/2019   Procedure: BIOPSY;  Surgeon: Ronald Lobo, MD;  Location: [facility] ENDOSCOPY;  Service: Endoscopy;;  . ESOPHAGOGASTRODUODENOSCOPY N/A 11/19/2019   Procedure: ESOPHAGOGASTRODUODENOSCOPY (EGD);  Surgeon: Ronald Lobo, MD;  Location: [facility] ENDOSCOPY;  Service: Endoscopy;  Laterality: N/A;  . HEMOSTASIS CLIP PLACEMENT  11/19/2019   Procedure: HEMOSTASIS CLIP PLACEMENT;  Surgeon: Ronald Lobo, MD;  Location: [place];  Service: Endoscopy;;  . NEPHRECTOMY       Current Outpatient Medications  Medication Sig Dispense Refill  . apixaban (ELIQUIS) 5 MG TABS tablet Take 1 tablet (5 mg total) by mouth 2 (two) times daily. 60 tablet 2  . atorvastatin (LIPITOR) 10 MG tablet Take 10 mg by mouth daily.    . carvedilol (COREG) 6.25 MG tablet Take 1 tablet (6.25 mg total) by mouth 2 (two) times daily with a meal. 60 tablet 6  . clarithromycin (BIAXIN) 500 MG tablet Take 1 tablet (500 mg total) by mouth every 12 (twelve) hours. (Patient not taking: Reported on 12/10/2019) 24 tablet 0  . digoxin (LANOXIN) 0.125 MG tablet Take 0.125 mg by mouth daily.     Marland Kitchen docusate sodium (COLACE)  100 MG capsule Take 1 capsule (100 mg total) by mouth daily. (Patient not taking: Reported on 12/10/2019) 10 capsule 0  . ferrous sulfate 325 (65 FE) MG tablet Take 1 tablet (325 mg total) by mouth 2 (two) times daily with a meal.    . guaiFENesin-dextromethorphan (ROBITUSSIN DM) 100-10 MG/5ML syrup Take 5 mLs by mouth every 4 (four) hours as needed for cough. 118 mL 0  . Menthol-Methyl Salicylate (MUSCLE RUB) 10-15 % CREA Apply 1 application topically as needed for muscle pain. (Patient taking differently: Apply 1 application topically 4 (four)  times daily as needed for muscle pain. )  0  . Omega-3 Fatty Acids (FISH OIL) 1000 MG CAPS Take 1,000 mg by mouth every evening.     . pantoprazole (PROTONIX) 40 MG tablet Take 1 tablet (40 mg total) by mouth 2 (two) times daily. 60 tablet 3  . phenol (CHLORASEPTIC) 1.4 % LIQD Use as directed 1 spray in the mouth or throat as needed for throat irritation / pain.  0  . potassium chloride (KLOR-CON) 10 MEQ tablet Take 1 tablet (10 mEq total) by mouth daily. 30 tablet 3  . sucralfate (CARAFATE) 1 GM/10ML suspension Take 10 mLs (1 g total) by mouth 4 (four) times daily -  with meals and at bedtime. (Patient not taking: Reported on 12/10/2019) 420 mL 0  . torsemide (DEMADEX) 10 MG tablet Take 1 tablet (10 mg total) by mouth daily. 30 tablet 3  . acetaminophen (TYLENOL) 500 MG tablet Take 1,000 mg by mouth every 6 (six) hours as needed (for pain.).    Marland Kitchen ascorbic acid (V-R VITAMIN C) 500 MG tablet Take 500 mg by mouth every evening.    . Garlic 6967 MG CAPS Take 1,000 mg by mouth every evening.    . Glucosamine HCl 1000 MG TABS Take 1,000 mg by mouth every evening.    . Multiple Vitamin (MULTIVITAMIN WITH MINERALS) TABS tablet Take 1 tablet by mouth every evening.    . Saw Palmetto 450 MG CAPS Take 450 mg by mouth every evening.     No current facility-administered medications for this visit.    Allergies:   Novocain [procaine]    Social History:  The patient  reports that he quit smoking about 21 years ago. His smoking use included cigarettes. He has a 70.00 pack-year smoking history. He has never used smokeless tobacco. He reports current alcohol use. He reports that he does not use drugs.   Family History:  The patient's family history includes Cancer in his mother; Heart attack in his brother and father; Heart disease in his sister; Stroke in his paternal grandmother.    ROS:  Please see the history of present illness.   Otherwise, review of systems are positive for none.   All other systems are  reviewed and negative.    PHYSICAL EXAM: VS:  BP 100/60   Pulse 97   Ht 6\' 2"  (1.88 m)   Wt 225 lb (102.1 kg)   SpO2 98%   BMI 28.89 kg/m  , BMI Body mass index is 28.89 kg/m. GENERAL:  Well appearing HEENT:  Pupils equal round and reactive, fundi not visualized, oral mucosa unremarkable NECK:  No jugular venous distention, waveform within normal limits, carotid upstroke brisk and symmetric, no bruits, no thyromegaly LYMPHATICS:  No cervical adenopathy LUNGS:  Clear to auscultation bilaterally HEART:  Irregularly irregular.  PMI not displaced or sustained,S1 and S2 within normal limits, no S3, no S4, no clicks, no  rubs, no murmurs ABD:  Flat, positive bowel sounds normal in frequency in pitch, no bruits, no rebound, no guarding, no midline pulsatile mass, no hepatomegaly, no splenomegaly EXT:  2 plus pulses throughout, no edema, no cyanosis no clubbing SKIN:  No rashes no nodules NEURO:  Cranial nerves II through XII grossly intact, motor grossly intact throughout PSYCH:  Cognitively intact, oriented to person place and time   EKG:  EKG is ordered today. The ekg ordered today demonstrates atrial fibrillation.  Frequent PVCs.    Echo 11/16/19:  1. Left ventricular ejection fraction, by estimation, is 25 to 30%. The  left ventricle has severely decreased function. The left ventrical  demonstrates global hypokinesis. The left ventricular internal cavity size  was mildly dilated. Indeterminate  diastolic filling due to E-A fusion. There is severe hypokinesis of the  left ventricular, entire anterior wall. There is mild hypokinesis of the  left ventricular, entire inferior wall.  2. Right ventricular systolic function is mildly reduced. The right  ventricular size is mildly enlarged. There is moderately elevated  pulmonary artery systolic pressure.  3. Moderate mitral valve regurgitation.  4. Tricuspid valve regurgitation is moderate.  5. The aortic valve is tricuspid. Aortic  valve regurgitation is mild.   Recent Labs: 11/15/2019: TSH 1.647 11/25/2019: ALT 167 11/26/2019: BUN 17; Creatinine, Ser 1.32; Hemoglobin 8.8; Platelets 186; Potassium 4.1; Sodium 132    Lipid Panel No results found for: CHOL, TRIG, HDL, CHOLHDL, VLDL, LDLCALC, LDLDIRECT    Wt Readings from Last 3 Encounters:  12/08/19 225 lb (102.1 kg)  12/07/19 234 lb (106.1 kg)  12/03/19 225 lb (102.1 kg)      ASSESSMENT AND PLAN:  # Persistent atrial fibrillation: Likely related to COVID pneumonia.  Rate is marginally controlled.  He doesn't have room for increased beta blocker.  He has been on clarithromycin and will take his last dose on Tuesday.  He will increase digoxin to daily at that time.  Continue carvedilol and Eliquis.  We will plan for DCCV after he has been on anticoagulation for 3 weeks.    # Chronic systolic and diastolic heart failure: LVEF 25-30% with wall motion abnormalities noted.  He has no symptoms of ischemia.  He has a history of RCC and nephrectomy.  He has CKD 3.  Therefore, we will plan to get a Lexiscan Myoview to assess for ischemia rather than LHC.  He is euvolemic but BP is low.  Increasing digoxin as above.  Continue carvedilol and torsemide.  He isn't on an ARB or Entresto 2/2 CKD with acute on chronic renal failure in the hospital.  BP is too low to start one now.  He will need repeat assess ment of LVEF in 3 months.   # Upper GI Bleed:  Melvin Ramirez had a GI bleed after starting Eliquis.  He was found to be positive for H. Pylori and required clipping at endoscopy.  He denies any recurrent melena.  Check CBC.  # R LE DVT: Occurred in the post-COVID period.  Continue Eliquis.    # Hyperlipidemia: Continue atorvastatin.   Current medicines are reviewed at length with the patient today.  The patient does not have concerns regarding medicines.  The following changes have been made:  Increase digoxin to daily next week  Labs/ tests ordered today include:   Orders  Placed This Encounter  Procedures  . Basic metabolic panel  . CBC with Differential/Platelet  . MYOCARDIAL PERFUSION IMAGING  . EKG 12-Lead  Disposition:   FU with Melvin Chaikin C. Oval Linsey, MD, [facility] in 1 month.     Signed, Melvin Kendrick C. Oval Linsey, MD, [facility]  12/13/2019 4:10 AM    [place]

## 2019-12-03 NOTE — H&P (View-Only) (Signed)
Cardiology Office Note   Date:  12/13/2019   ID:  Melvin Ramirez, DOB 1946/02/29, MRN [id number]  PCP:  [name], MD  Cardiologist:   Skeet Latch, MD   No chief complaint on file.     History of Present Illness: Melvin Ramirez is a 74 y.o. male with paroxysmal atrial fibrillation, chronic systolic and diastolic heart failure, DM, CKD 3, RCC s/p nephrectomy, and obesity here to establish care.  He was recently admitted 11/2027 with GI bleeding and atrial fibrillation in the setting of recent COVID pneumonia.  He had COVID pneumonia approximately 1 month prior to admission.  He subsequently developed palpitations and cough.  His heart rate was in the 160s and he was found to be in atrial fibrillation.  Thyroid function was normal.  He was started on anticoagulation and developed bleeding gastric ulcers that required clipping.  He was found to be positive for H. Pylori.  He was subsequently started on Eliquis.  Echo that admission revealed LVEF 25 to 30% with global hypokinesis.  Hypokinesis was worse than the anterior myocardium.  RV function was mildly reduced.  High sensitivity troponin was mildly elevated to 39 Plans were made for outpatient LHC given his GIB.  During that hospitalization he had LE Doppler that was indeterminate for R popliteal vein.  Melvin Ramirez is still recovering from [place].  He was treated with monoclonal antibodies and lost 40 lb.  He has been struggling to get his strength back.  His BP at home has been 88-112/52-73.  Heart rate has ranged from 53-95.  Lately he hasn't had any lower extremity edema, orthopnea, or PND.  He has no exertional chest pain and his breathing has been slowly improving.   Past Medical History:  Diagnosis Date  . Chronic combined systolic and diastolic heart failure ([place]) 12/13/2019  . Chronic kidney disease   . CKD (chronic kidney disease) stage 3, GFR 30-59 ml/min 12/13/2019  . Diabetes mellitus ([place])   . Gastric ulcer 12/13/2019   +H.  Pylori. Bleeding required clipping.  . H/O blood clots   . History of kidney cancer   . Persistent atrial fibrillation ([place]) 12/13/2019  . Renal cell carcinoma ([place]) 12/13/2019   S/p nephrectomy    Past Surgical History:  Procedure Laterality Date  . APPENDECTOMY    . BIOPSY  11/19/2019   Procedure: BIOPSY;  Surgeon: Ronald Lobo, MD;  Location: [facility] ENDOSCOPY;  Service: Endoscopy;;  . ESOPHAGOGASTRODUODENOSCOPY N/A 11/19/2019   Procedure: ESOPHAGOGASTRODUODENOSCOPY (EGD);  Surgeon: Ronald Lobo, MD;  Location: [facility] ENDOSCOPY;  Service: Endoscopy;  Laterality: N/A;  . HEMOSTASIS CLIP PLACEMENT  11/19/2019   Procedure: HEMOSTASIS CLIP PLACEMENT;  Surgeon: Ronald Lobo, MD;  Location: [place];  Service: Endoscopy;;  . NEPHRECTOMY       Current Outpatient Medications  Medication Sig Dispense Refill  . apixaban (ELIQUIS) 5 MG TABS tablet Take 1 tablet (5 mg total) by mouth 2 (two) times daily. 60 tablet 2  . atorvastatin (LIPITOR) 10 MG tablet Take 10 mg by mouth daily.    . carvedilol (COREG) 6.25 MG tablet Take 1 tablet (6.25 mg total) by mouth 2 (two) times daily with a meal. 60 tablet 6  . clarithromycin (BIAXIN) 500 MG tablet Take 1 tablet (500 mg total) by mouth every 12 (twelve) hours. (Patient not taking: Reported on 12/10/2019) 24 tablet 0  . digoxin (LANOXIN) 0.125 MG tablet Take 0.125 mg by mouth daily.     Marland Kitchen docusate sodium (COLACE)  100 MG capsule Take 1 capsule (100 mg total) by mouth daily. (Patient not taking: Reported on 12/10/2019) 10 capsule 0  . ferrous sulfate 325 (65 FE) MG tablet Take 1 tablet (325 mg total) by mouth 2 (two) times daily with a meal.    . guaiFENesin-dextromethorphan (ROBITUSSIN DM) 100-10 MG/5ML syrup Take 5 mLs by mouth every 4 (four) hours as needed for cough. 118 mL 0  . Menthol-Methyl Salicylate (MUSCLE RUB) 10-15 % CREA Apply 1 application topically as needed for muscle pain. (Patient taking differently: Apply 1 application topically 4 (four)  times daily as needed for muscle pain. )  0  . Omega-3 Fatty Acids (FISH OIL) 1000 MG CAPS Take 1,000 mg by mouth every evening.     . pantoprazole (PROTONIX) 40 MG tablet Take 1 tablet (40 mg total) by mouth 2 (two) times daily. 60 tablet 3  . phenol (CHLORASEPTIC) 1.4 % LIQD Use as directed 1 spray in the mouth or throat as needed for throat irritation / pain.  0  . potassium chloride (KLOR-CON) 10 MEQ tablet Take 1 tablet (10 mEq total) by mouth daily. 30 tablet 3  . sucralfate (CARAFATE) 1 GM/10ML suspension Take 10 mLs (1 g total) by mouth 4 (four) times daily -  with meals and at bedtime. (Patient not taking: Reported on 12/10/2019) 420 mL 0  . torsemide (DEMADEX) 10 MG tablet Take 1 tablet (10 mg total) by mouth daily. 30 tablet 3  . acetaminophen (TYLENOL) 500 MG tablet Take 1,000 mg by mouth every 6 (six) hours as needed (for pain.).    Marland Kitchen ascorbic acid (V-R VITAMIN C) 500 MG tablet Take 500 mg by mouth every evening.    . Garlic 7829 MG CAPS Take 1,000 mg by mouth every evening.    . Glucosamine HCl 1000 MG TABS Take 1,000 mg by mouth every evening.    . Multiple Vitamin (MULTIVITAMIN WITH MINERALS) TABS tablet Take 1 tablet by mouth every evening.    . Saw Palmetto 450 MG CAPS Take 450 mg by mouth every evening.     No current facility-administered medications for this visit.    Allergies:   Novocain [procaine]    Social History:  The patient  reports that he quit smoking about 21 years ago. His smoking use included cigarettes. He has a 70.00 pack-year smoking history. He has never used smokeless tobacco. He reports current alcohol use. He reports that he does not use drugs.   Family History:  The patient's family history includes Cancer in his mother; Heart attack in his brother and father; Heart disease in his sister; Stroke in his paternal grandmother.    ROS:  Please see the history of present illness.   Otherwise, review of systems are positive for none.   All other systems are  reviewed and negative.    PHYSICAL EXAM: VS:  BP 100/60   Pulse 97   Ht 6\' 2"  (1.88 m)   Wt 225 lb (102.1 kg)   SpO2 98%   BMI 28.89 kg/m  , BMI Body mass index is 28.89 kg/m. GENERAL:  Well appearing HEENT:  Pupils equal round and reactive, fundi not visualized, oral mucosa unremarkable NECK:  No jugular venous distention, waveform within normal limits, carotid upstroke brisk and symmetric, no bruits, no thyromegaly LYMPHATICS:  No cervical adenopathy LUNGS:  Clear to auscultation bilaterally HEART:  Irregularly irregular.  PMI not displaced or sustained,S1 and S2 within normal limits, no S3, no S4, no clicks, no  rubs, no murmurs ABD:  Flat, positive bowel sounds normal in frequency in pitch, no bruits, no rebound, no guarding, no midline pulsatile mass, no hepatomegaly, no splenomegaly EXT:  2 plus pulses throughout, no edema, no cyanosis no clubbing SKIN:  No rashes no nodules NEURO:  Cranial nerves II through XII grossly intact, motor grossly intact throughout PSYCH:  Cognitively intact, oriented to person place and time   EKG:  EKG is ordered today. The ekg ordered today demonstrates atrial fibrillation.  Frequent PVCs.    Echo 11/16/19:  1. Left ventricular ejection fraction, by estimation, is 25 to 30%. The  left ventricle has severely decreased function. The left ventrical  demonstrates global hypokinesis. The left ventricular internal cavity size  was mildly dilated. Indeterminate  diastolic filling due to E-A fusion. There is severe hypokinesis of the  left ventricular, entire anterior wall. There is mild hypokinesis of the  left ventricular, entire inferior wall.  2. Right ventricular systolic function is mildly reduced. The right  ventricular size is mildly enlarged. There is moderately elevated  pulmonary artery systolic pressure.  3. Moderate mitral valve regurgitation.  4. Tricuspid valve regurgitation is moderate.  5. The aortic valve is tricuspid. Aortic  valve regurgitation is mild.   Recent Labs: 11/15/2019: TSH 1.647 11/25/2019: ALT 167 11/26/2019: BUN 17; Creatinine, Ser 1.32; Hemoglobin 8.8; Platelets 186; Potassium 4.1; Sodium 132    Lipid Panel No results found for: CHOL, TRIG, HDL, CHOLHDL, VLDL, LDLCALC, LDLDIRECT    Wt Readings from Last 3 Encounters:  12/08/19 225 lb (102.1 kg)  12/07/19 234 lb (106.1 kg)  12/03/19 225 lb (102.1 kg)      ASSESSMENT AND PLAN:  # Persistent atrial fibrillation: Likely related to COVID pneumonia.  Rate is marginally controlled.  He doesn't have room for increased beta blocker.  He has been on clarithromycin and will take his last dose on Tuesday.  He will increase digoxin to daily at that time.  Continue carvedilol and Eliquis.  We will plan for DCCV after he has been on anticoagulation for 3 weeks.    # Chronic systolic and diastolic heart failure: LVEF 25-30% with wall motion abnormalities noted.  He has no symptoms of ischemia.  He has a history of RCC and nephrectomy.  He has CKD 3.  Therefore, we will plan to get a Lexiscan Myoview to assess for ischemia rather than LHC.  He is euvolemic but BP is low.  Increasing digoxin as above.  Continue carvedilol and torsemide.  He isn't on an ARB or Entresto 2/2 CKD with acute on chronic renal failure in the hospital.  BP is too low to start one now.  He will need repeat assess ment of LVEF in 3 months.   # Upper GI Bleed:  Melvin Ramirez had a GI bleed after starting Eliquis.  He was found to be positive for H. Pylori and required clipping at endoscopy.  He denies any recurrent melena.  Check CBC.  # R LE DVT: Occurred in the post-COVID period.  Continue Eliquis.    # Hyperlipidemia: Continue atorvastatin.   Current medicines are reviewed at length with the patient today.  The patient does not have concerns regarding medicines.  The following changes have been made:  Increase digoxin to daily next week  Labs/ tests ordered today include:   Orders  Placed This Encounter  Procedures  . Basic metabolic panel  . CBC with Differential/Platelet  . MYOCARDIAL PERFUSION IMAGING  . EKG 12-Lead  Disposition:   FU with Melvin Suman C. Oval Linsey, MD, [facility] in 1 month.     Signed, Melvin Bai C. Oval Linsey, MD, [facility]  12/13/2019 4:10 AM    [place]

## 2019-12-04 ENCOUNTER — Telehealth (HOSPITAL_COMMUNITY): Payer: Self-pay

## 2019-12-04 NOTE — Telephone Encounter (Signed)
Encounter complete. 

## 2019-12-07 ENCOUNTER — Other Ambulatory Visit: Payer: Self-pay

## 2019-12-07 ENCOUNTER — Encounter: Payer: Self-pay | Admitting: Emergency Medicine

## 2019-12-07 ENCOUNTER — Ambulatory Visit (INDEPENDENT_AMBULATORY_CARE_PROVIDER_SITE_OTHER): Payer: Commercial Managed Care - PPO | Admitting: Emergency Medicine

## 2019-12-07 VITALS — BP 90/58 | HR 61 | Temp 98.0°F | Ht 73.5 in | Wt 234.0 lb

## 2019-12-07 DIAGNOSIS — R0600 Dyspnea, unspecified: Secondary | ICD-10-CM

## 2019-12-07 DIAGNOSIS — I4891 Unspecified atrial fibrillation: Secondary | ICD-10-CM

## 2019-12-07 DIAGNOSIS — J984 Other disorders of lung: Secondary | ICD-10-CM

## 2019-12-07 DIAGNOSIS — J449 Chronic obstructive pulmonary disease, unspecified: Secondary | ICD-10-CM

## 2019-12-07 DIAGNOSIS — J189 Pneumonia, unspecified organism: Secondary | ICD-10-CM | POA: Diagnosis not present

## 2019-12-07 DIAGNOSIS — U071 COVID-19: Secondary | ICD-10-CM

## 2019-12-07 DIAGNOSIS — J1282 Pneumonia due to coronavirus disease 2019: Secondary | ICD-10-CM | POA: Diagnosis not present

## 2019-12-07 NOTE — Assessment & Plan Note (Signed)
Presumed COPD, not currently on bronchodilators.  Pulmonary function testing ordered and pending.  We will do a trial of BD at his next office visit.

## 2019-12-07 NOTE — Assessment & Plan Note (Signed)
With improving pulmonary infiltrates on serial CT scans, most recent 11/17/2019.  Still with persistent groundglass.  May be related to resolving Covid pneumonitis, consider also some contribution of cardiogenic edema given his recent hospitalization, atrial fibrillation with RVR.  I suspect that it was mainly viral pneumonitis.  He will need a repeat CT in several weeks to assess for improvement in the infiltrates.  If they persist then we will determine whether any further evaluation is indicated.  He is at risk for post viral ILD.  His autoimmune labs from 2/9 were all negative.   We will plan to perform pulmonary function testing in April 2021, review here the same day We will repeat your CT scan of the chest in April 2021, review next time Follow with Dr Lamonte Sakai in April to review your testing.

## 2019-12-07 NOTE — Patient Instructions (Signed)
Follow with Dr. Oval Linsey and get your testing, cardioversion as per her plans. Continue your Eliquis.  We will determine the duration of this medication depending on whether you are able to get back into normal sinus rhythm.  If so then we can probably complete 6 months and then stop (to treat your lower extremity blood clot). We will plan to perform pulmonary function testing in April 2021, review here the same day We will repeat your CT scan of the chest in April 2021, review next time Follow with Dr Lamonte Sakai in April to review your testing.

## 2019-12-07 NOTE — Progress Notes (Signed)
Subjective:    Patient ID: Melvin Ramirez, male    DOB: [birth year], 74 y.o.   MRN: [id number]  HPI 74 year old former smoker (70 pack years) with probable COPD.  Carries diagnosis of diabetes with neuropathy and paresthesias, renal cell carcinoma post nephrectomy (remote) with associated chronic renal insufficiency.  He and his wife both unfortunately had COVID-19 in early January.  He required hospital admission 11/15/2019.  He did get the monocloncal antibody.  He was discharged on 11/26/2019.  Hospital course was complicated by some paroxysmal atrial fibrillation, gastric ulcers (EGD > clipped), lower extremity DVT for which she was started on Eliquis.  High-resolution CT scan of the chest was performed to evaluate pulmonary infiltrates.  This confirmed patchy bilateral paraseptal groundglass opacities, slightly improving compared with 10/26/2019, small dependent bilateral pleural effusions, dilated main pulmonary artery consistent with PAH.he was treated empirically for possible superimposed bacterial PNA. He does not feel back to full strength, does still have some cough. Does feel very week. He lost about 40 lbs through the illness. He is not back to work yet but wants to do so. Has not seen any melena, BRBPR. LVEF 25-30%. Dr Oval Linsey has recommended a stress test and a cardioversion on 3/11.   Autoimmune labs were sent 2/9 during his hospitalization, reviewed.  CCP, anti-SCL 70, SSA and SSB, RF, Double-stranded DNA, anti-GBM, ANCA, ANA were all negative.  Review of Systems  Constitutional: Positive for unexpected weight change. Negative for fever.  HENT: Negative for congestion, dental problem, ear pain, nosebleeds, postnasal drip, rhinorrhea, sinus pressure, sneezing, sore throat and trouble swallowing.   Eyes: Negative for redness and itching.  Respiratory: Positive for cough and shortness of breath. Negative for chest tightness and wheezing.   Cardiovascular: Positive for palpitations. Negative  for leg swelling.  Gastrointestinal: Negative for nausea and vomiting.  Genitourinary: Negative for dysuria.  Musculoskeletal: Negative for joint swelling.  Skin: Negative for rash.  Allergic/Immunologic: Negative.  Negative for environmental allergies, food allergies and immunocompromised state.  Neurological: Negative for headaches.  Hematological: Does not bruise/bleed easily.  Psychiatric/Behavioral: Negative for dysphoric mood. The patient is not nervous/anxious.     Past Medical History:  Diagnosis Date  . Chronic kidney disease   . Diabetes mellitus ([place])   . H/O blood clots   . History of kidney cancer      Family History  Problem Relation Age of Onset  . Cancer Mother   . Heart attack Father   . Heart attack Brother   . Heart disease Sister   . Stroke Paternal Grandmother      Social History   Socioeconomic History  . Marital status: Married    Spouse name: Not on file  . Number of children: Not on file  . Years of education: Not on file  . Highest education level: Not on file  Occupational History  . Occupation: truck Geophysicist/field seismologist  Tobacco Use  . Smoking status: Former Smoker    Packs/day: 2.00    Years: 35.00    Pack years: 70.00    Types: Cigarettes    Quit date: 2000    Years since quitting: 21.1  . Smokeless tobacco: Never Used  Substance and Sexual Activity  . Alcohol use: Yes    Comment: Socially  . Drug use: No  . Sexual activity: Not on file  Other Topics Concern  . Not on file  Social History Narrative   Lives with wife in a one story home.  Has 3 children.  Works as a Administrator.     Education: 12th grade.   Social Determinants of Health   Financial Resource Strain:   . Difficulty of Paying Living Expenses: Not on file  Food Insecurity:   . Worried About Charity fundraiser in the Last Year: Not on file  . Ran Out of Food in the Last Year: Not on file  Transportation Needs:   . Lack of Transportation (Medical): Not on file  . Lack  of Transportation (Non-Medical): Not on file  Physical Activity:   . Days of Exercise per Week: Not on file  . Minutes of Exercise per Session: Not on file  Stress:   . Feeling of Stress : Not on file  Social Connections:   . Frequency of Communication with Friends and Family: Not on file  . Frequency of Social Gatherings with Friends and Family: Not on file  . Attends Religious Services: Not on file  . Active Member of Clubs or Organizations: Not on file  . Attends Archivist Meetings: Not on file  . Marital Status: Not on file  Intimate Partner Violence:   . Fear of Current or Ex-Partner: Not on file  . Emotionally Abused: Not on file  . Physically Abused: Not on file  . Sexually Abused: Not on file   Truck driver, loads and unloads insulation.  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 In [place]. Once worked at [employer], [place]. Had a dust exposure.  No mold exposure.   Allergies  Allergen Reactions  . Novocain [Procaine] Other (See Comments)    Syncope   . Other     Other reaction(s): passes out     Outpatient Medications Prior to Visit  Medication Sig Dispense Refill  . acetaminophen (TYLENOL) 325 MG tablet Take 2 tablets (650 mg total) by mouth every 6 (six) hours as needed for moderate pain.    Marland Kitchen amoxicillin (AMOXIL) 500 MG capsule Take 2 capsules (1,000 mg total) by mouth every 12 (twelve) hours for 12 days. 48 capsule 0  . apixaban (ELIQUIS) 5 MG TABS tablet Take 1 tablet (5 mg total) by mouth 2 (two) times daily. 60 tablet 2  . Ascorbic Acid (VITAMIN C) 100 MG tablet as directed    . atorvastatin (LIPITOR) 10 MG tablet Take 10 mg by mouth daily.    . carvedilol (COREG) 6.25 MG tablet Take 1 tablet (6.25 mg total) by mouth 2 (two) times daily with a meal. 60 tablet 6  . clarithromycin (BIAXIN) 500 MG tablet Take 1 tablet (500 mg total) by mouth every 12 (twelve) hours. 24 tablet 0  . digoxin (LANOXIN) 0.125 MG tablet Take 0.125 mg by mouth as  directed. TAKE Monday, Wednesday, AND Friday ONLY UNTIL 12/08/2019 START TAKING DAILY    . docusate sodium (COLACE) 100 MG capsule Take 1 capsule (100 mg total) by mouth daily. 10 capsule 0  . ferrous sulfate 325 (65 FE) MG tablet Take 1 tablet (325 mg total) by mouth 2 (two) times daily with a meal.    . Garlic 818 MG CAPS as directed    . Glucosamine 500 MG CAPS as directed    . guaiFENesin-dextromethorphan (ROBITUSSIN DM) 100-10 MG/5ML syrup Take 5 mLs by mouth every 4 (four) hours as needed for cough. 118 mL 0  . Menthol-Methyl Salicylate (MUSCLE RUB) 10-15 % CREA Apply 1 application topically as needed for muscle pain.  0  . Multiple Vitamin (MULTIVITAMIN) capsule as directed    .  Omega-3 Fatty Acids (FISH OIL) 1000 MG CAPS Take 1,000 mg by mouth daily.     . pantoprazole (PROTONIX) 40 MG tablet Take 1 tablet (40 mg total) by mouth 2 (two) times daily. 60 tablet 3  . phenol (CHLORASEPTIC) 1.4 % LIQD Use as directed 1 spray in the mouth or throat as needed for throat irritation / pain.  0  . potassium chloride (KLOR-CON) 10 MEQ tablet Take 1 tablet (10 mEq total) by mouth daily. 30 tablet 3  . Saw Palmetto 500 MG CAPS as directed    . sucralfate (CARAFATE) 1 GM/10ML suspension Take 10 mLs (1 g total) by mouth 4 (four) times daily -  with meals and at bedtime. 420 mL 0  . torsemide (DEMADEX) 10 MG tablet Take 1 tablet (10 mg total) by mouth daily. 30 tablet 3   No facility-administered medications prior to visit.        Objective:   Physical Exam Vitals:   12/07/19 0913  BP: (!) 90/58  Pulse: 61  Temp: 98 F (36.7 C)  TempSrc: Temporal  SpO2: 100%  Weight: 234 lb (106.1 kg)  Height: 6' 1.5" (1.867 m)   Gen: Pleasant, well-nourished, in no distress,  normal affect  ENT: No lesions,  mouth clear,  oropharynx clear, no postnasal drip  Neck: No JVD, no stridor  Lungs: No use of accessory muscles, distant, coarse, no wheeze  Cardiovascular: RRR, heart sounds normal, no murmur or  gallops, trace peripheral edema  Musculoskeletal: No deformities, no cyanosis or clubbing  Neuro: alert, awake, non focal  Skin: Warm, no lesions or rash     Assessment & Plan:  Pneumonia due to COVID-19 virus With improving pulmonary infiltrates on serial CT scans, most recent 11/17/2019.  Still with persistent groundglass.  May be related to resolving Covid pneumonitis, consider also some contribution of cardiogenic edema given his recent hospitalization, atrial fibrillation with RVR.  I suspect that it was mainly viral pneumonitis.  He will need a repeat CT in several weeks to assess for improvement in the infiltrates.  If they persist then we will determine whether any further evaluation is indicated.  He is at risk for post viral ILD.  His autoimmune labs from 2/9 were all negative.   We will plan to perform pulmonary function testing in April 2021, review here the same day We will repeat your CT scan of the chest in April 2021, review next time Follow with Dr Lamonte Sakai in April to review your testing.  Atrial fibrillation with RVR ([place]) He is on Eliquis for this and also for DVT discovered when he was hospitalized for COVID-19.  Planning for a stress test and also DCCV.  If we believe that the atrial fibrillation was all secondary to the stress from Covid, then he may not need anticoagulation for the A. fib.  If so then we could treat for 3 to 6 months and stop (to cover him for the lower extremity DVT).  COPD (chronic obstructive pulmonary disease) ([place]) Presumed COPD, not currently on bronchodilators.  Pulmonary function testing ordered and pending.  We will do a trial of BD at his next office visit.  Baltazar Apo, MD, PhD 12/07/2019, 5:09 PM [facility] [phone] or if no answer [phone]

## 2019-12-07 NOTE — Assessment & Plan Note (Signed)
He is on Eliquis for this and also for DVT discovered when he was hospitalized for COVID-19.  Planning for a stress test and also DCCV.  If we believe that the atrial fibrillation was all secondary to the stress from Covid, then he may not need anticoagulation for the A. fib.  If so then we could treat for 3 to 6 months and stop (to cover him for the lower extremity DVT).

## 2019-12-08 ENCOUNTER — Ambulatory Visit (HOSPITAL_COMMUNITY)
Admission: RE | Admit: 2019-12-08 | Discharge: 2019-12-08 | Disposition: A | Discharge status: Home / Self Care | Payer: Commercial Managed Care - PPO | Source: Ambulatory Visit | Attending: Cardiology | Admitting: Cardiology

## 2019-12-08 DIAGNOSIS — I509 Heart failure, unspecified: Secondary | ICD-10-CM | POA: Insufficient documentation

## 2019-12-08 DIAGNOSIS — I4819 Other persistent atrial fibrillation: Secondary | ICD-10-CM | POA: Diagnosis not present

## 2019-12-08 LAB — MYOCARDIAL PERFUSION IMAGING
Peak HR: 125 {beats}/min
Rest HR: 100 {beats}/min
SDS: 3
SRS: 4
SSS: 7
TID: 1.01

## 2019-12-08 MED ORDER — REGADENOSON 0.4 MG/5ML IV SOLN
0.4000 mg | Freq: Once | INTRAVENOUS | Status: AC
  Administered 2019-12-08: 0.4 mg via INTRAVENOUS

## 2019-12-08 MED ORDER — TECHNETIUM TC 99M TETROFOSMIN IV KIT
30.9000 | PACK | Freq: Once | INTRAVENOUS | Status: AC | PRN
  Administered 2019-12-08: 30.9 via INTRAVENOUS
  Filled 2019-12-08: qty 31

## 2019-12-08 MED ORDER — TECHNETIUM TC 99M TETROFOSMIN IV KIT
9.8000 | PACK | Freq: Once | INTRAVENOUS | Status: AC | PRN
  Administered 2019-12-08: 9.8 via INTRAVENOUS
  Filled 2019-12-08: qty 10

## 2019-12-09 ENCOUNTER — Ambulatory Visit: Payer: PPO | Admitting: Cardiology

## 2019-12-13 ENCOUNTER — Encounter: Payer: Self-pay | Admitting: Cardiovascular Disease

## 2019-12-13 DIAGNOSIS — K259 Gastric ulcer, unspecified as acute or chronic, without hemorrhage or perforation: Secondary | ICD-10-CM

## 2019-12-13 DIAGNOSIS — C649 Malignant neoplasm of unspecified kidney, except renal pelvis: Secondary | ICD-10-CM

## 2019-12-13 DIAGNOSIS — N183 Chronic kidney disease, stage 3 unspecified: Secondary | ICD-10-CM

## 2019-12-13 DIAGNOSIS — I4819 Other persistent atrial fibrillation: Secondary | ICD-10-CM

## 2019-12-13 DIAGNOSIS — E119 Type 2 diabetes mellitus without complications: Secondary | ICD-10-CM | POA: Insufficient documentation

## 2019-12-13 DIAGNOSIS — I5042 Chronic combined systolic (congestive) and diastolic (congestive) heart failure: Secondary | ICD-10-CM

## 2019-12-13 HISTORY — DX: Gastric ulcer, unspecified as acute or chronic, without hemorrhage or perforation: K25.9

## 2019-12-13 HISTORY — DX: Chronic kidney disease, stage 3 unspecified: N18.30

## 2019-12-13 HISTORY — DX: Malignant neoplasm of unspecified kidney, except renal pelvis: C64.9

## 2019-12-13 HISTORY — DX: Other persistent atrial fibrillation: I48.19

## 2019-12-13 HISTORY — DX: Chronic combined systolic (congestive) and diastolic (congestive) heart failure: I50.42

## 2019-12-14 ENCOUNTER — Other Ambulatory Visit (HOSPITAL_COMMUNITY)
Admission: RE | Admit: 2019-12-14 | Discharge: 2019-12-14 | Disposition: A | Discharge status: Home / Self Care | Payer: Commercial Managed Care - PPO | Source: Ambulatory Visit | Attending: Cardiovascular Disease | Admitting: Cardiovascular Disease

## 2019-12-14 DIAGNOSIS — Z01812 Encounter for preprocedural laboratory examination: Secondary | ICD-10-CM | POA: Insufficient documentation

## 2019-12-14 DIAGNOSIS — Z20822 Contact with and (suspected) exposure to covid-19: Secondary | ICD-10-CM | POA: Insufficient documentation

## 2019-12-14 LAB — SARS CORONAVIRUS 2 (TAT 6-24 HRS): SARS Coronavirus 2: NEGATIVE

## 2019-12-15 ENCOUNTER — Encounter: Payer: Self-pay | Admitting: *Deleted

## 2019-12-15 LAB — BASIC METABOLIC PANEL
BUN/Creatinine Ratio: 17 (ref 10–24)
BUN: 25 mg/dL (ref 8–27)
CO2: 20 mmol/L (ref 20–29)
Calcium: 9.1 mg/dL (ref 8.6–10.2)
Chloride: 102 mmol/L (ref 96–106)
Creatinine, Ser: 1.5 mg/dL — ABNORMAL HIGH (ref 0.76–1.27)
GFR calc Af Amer: 53 mL/min/{1.73_m2} — ABNORMAL LOW (ref >59)
GFR calc non Af Amer: 46 mL/min/{1.73_m2} — ABNORMAL LOW (ref >59)
Glucose: 113 mg/dL — ABNORMAL HIGH (ref 65–99)
Potassium: 4.8 mmol/L (ref 3.5–5.2)
Sodium: 144 mmol/L (ref 134–144)

## 2019-12-15 LAB — CBC WITH DIFFERENTIAL/PLATELET
Basophils Absolute: 0.1 10*3/uL (ref 0.0–0.2)
Basos: 1 %
EOS (ABSOLUTE): 0.1 10*3/uL (ref 0.0–0.4)
Eos: 1 %
Hematocrit: 36.5 % — ABNORMAL LOW (ref 37.5–51.0)
Hemoglobin: 11.7 g/dL — ABNORMAL LOW (ref 13.0–17.7)
Immature Grans (Abs): 0.1 10*3/uL (ref 0.0–0.1)
Immature Granulocytes: 1 %
Lymphocytes Absolute: 3.5 10*3/uL — ABNORMAL HIGH (ref 0.7–3.1)
Lymphs: 44 %
MCH: 30.7 pg (ref 26.6–33.0)
MCHC: 32.1 g/dL (ref 31.5–35.7)
MCV: 96 fL (ref 79–97)
Monocytes Absolute: 0.6 10*3/uL (ref 0.1–0.9)
Monocytes: 8 %
Neutrophils Absolute: 3.5 10*3/uL (ref 1.4–7.0)
Neutrophils: 45 %
Platelets: 263 10*3/uL (ref 150–450)
RBC: 3.81 x10E6/uL — ABNORMAL LOW (ref 4.14–5.80)
RDW: 14.6 % (ref 11.6–15.4)
WBC: 7.8 10*3/uL (ref 3.4–10.8)

## 2019-12-16 NOTE — Anesthesia Preprocedure Evaluation (Addendum)
Anesthesia Evaluation  Patient identified by MRN, date of birth, ID band Patient awake    Reviewed: Allergy & Precautions, NPO status , Patient's Chart, lab work & pertinent test results  History of Anesthesia Complications (+) PONV  Airway Mallampati: II  TM Distance: >3 FB Neck ROM: Full    Dental no notable dental hx. (+) Teeth Intact   Pulmonary COPD, former smoker,    Pulmonary exam normal breath sounds clear to auscultation       Cardiovascular + CAD and + DVT  + dysrhythmias Atrial Fibrillation  Rhythm:Irregular Rate:Abnormal  11/16/19 Echo Left ventricular ejection fraction, by estimation, is 25 to 30%. The  left ventricle has severely decreased function.   Neuro/Psych negative neurological ROS  negative psych ROS   GI/Hepatic PUD,   Endo/Other  diabetes  Renal/GU Cr 1.5 K+ 4.0     Musculoskeletal negative musculoskeletal ROS (+)   Abdominal (+) + obese,   Peds  Hematology   Anesthesia Other Findings   Reproductive/Obstetrics                           Anesthesia Physical Anesthesia Plan  ASA: IV  Anesthesia Plan: General   Post-op Pain Management:    Induction: Intravenous  PONV Risk Score and Plan: Treatment may vary due to age or medical condition  Airway Management Planned: Nasal Cannula and Natural Airway  Additional Equipment: None  Intra-op Plan:   Post-operative Plan:   Informed Consent:     Dental advisory given  Plan Discussed with:   Anesthesia Plan Comments:        Anesthesia Quick Evaluation

## 2019-12-17 ENCOUNTER — Other Ambulatory Visit: Payer: Self-pay

## 2019-12-17 ENCOUNTER — Ambulatory Visit (HOSPITAL_COMMUNITY): Payer: Commercial Managed Care - PPO | Admitting: Anesthesiology

## 2019-12-17 ENCOUNTER — Encounter (HOSPITAL_COMMUNITY)
Admission: RE | Disposition: A | Discharge status: Home / Self Care | Payer: Commercial Managed Care - PPO | Source: Home / Self Care | Attending: Cardiovascular Disease

## 2019-12-17 ENCOUNTER — Ambulatory Visit (HOSPITAL_COMMUNITY)
Admission: RE | Admit: 2019-12-17 | Discharge: 2019-12-17 | Disposition: A | Discharge status: Home / Self Care | Payer: Commercial Managed Care - PPO | Attending: Cardiovascular Disease | Admitting: Cardiovascular Disease

## 2019-12-17 ENCOUNTER — Encounter (HOSPITAL_COMMUNITY): Payer: Self-pay | Admitting: Cardiovascular Disease

## 2019-12-17 DIAGNOSIS — Z905 Acquired absence of kidney: Secondary | ICD-10-CM | POA: Insufficient documentation

## 2019-12-17 DIAGNOSIS — Z87891 Personal history of nicotine dependence: Secondary | ICD-10-CM | POA: Diagnosis not present

## 2019-12-17 DIAGNOSIS — Z79899 Other long term (current) drug therapy: Secondary | ICD-10-CM | POA: Insufficient documentation

## 2019-12-17 DIAGNOSIS — E785 Hyperlipidemia, unspecified: Secondary | ICD-10-CM | POA: Diagnosis not present

## 2019-12-17 DIAGNOSIS — N183 Chronic kidney disease, stage 3 unspecified: Secondary | ICD-10-CM | POA: Diagnosis not present

## 2019-12-17 DIAGNOSIS — I4891 Unspecified atrial fibrillation: Secondary | ICD-10-CM

## 2019-12-17 DIAGNOSIS — J449 Chronic obstructive pulmonary disease, unspecified: Secondary | ICD-10-CM | POA: Diagnosis not present

## 2019-12-17 DIAGNOSIS — I4819 Other persistent atrial fibrillation: Secondary | ICD-10-CM | POA: Insufficient documentation

## 2019-12-17 DIAGNOSIS — E1122 Type 2 diabetes mellitus with diabetic chronic kidney disease: Secondary | ICD-10-CM | POA: Diagnosis not present

## 2019-12-17 DIAGNOSIS — I5042 Chronic combined systolic (congestive) and diastolic (congestive) heart failure: Secondary | ICD-10-CM | POA: Diagnosis not present

## 2019-12-17 DIAGNOSIS — B9681 Helicobacter pylori [H. pylori] as the cause of diseases classified elsewhere: Secondary | ICD-10-CM | POA: Diagnosis not present

## 2019-12-17 DIAGNOSIS — I2510 Atherosclerotic heart disease of native coronary artery without angina pectoris: Secondary | ICD-10-CM | POA: Diagnosis not present

## 2019-12-17 DIAGNOSIS — E669 Obesity, unspecified: Secondary | ICD-10-CM | POA: Insufficient documentation

## 2019-12-17 DIAGNOSIS — Z6828 Body mass index (BMI) 28.0-28.9, adult: Secondary | ICD-10-CM | POA: Insufficient documentation

## 2019-12-17 DIAGNOSIS — Z86718 Personal history of other venous thrombosis and embolism: Secondary | ICD-10-CM | POA: Diagnosis not present

## 2019-12-17 DIAGNOSIS — E119 Type 2 diabetes mellitus without complications: Secondary | ICD-10-CM | POA: Diagnosis not present

## 2019-12-17 DIAGNOSIS — Z7901 Long term (current) use of anticoagulants: Secondary | ICD-10-CM | POA: Insufficient documentation

## 2019-12-17 DIAGNOSIS — Z85528 Personal history of other malignant neoplasm of kidney: Secondary | ICD-10-CM | POA: Diagnosis not present

## 2019-12-17 DIAGNOSIS — Z8616 Personal history of COVID-19: Secondary | ICD-10-CM | POA: Diagnosis not present

## 2019-12-17 HISTORY — PX: CARDIOVERSION: SHX1299

## 2019-12-17 SURGERY — CARDIOVERSION
Anesthesia: General

## 2019-12-17 MED ORDER — LIDOCAINE 2% (20 MG/ML) 5 ML SYRINGE
INTRAMUSCULAR | Status: DC | PRN
  Administered 2019-12-17: 60 mg via INTRAVENOUS

## 2019-12-17 MED ORDER — SODIUM CHLORIDE 0.9 % IV SOLN: INTRAVENOUS | Status: DC

## 2019-12-17 MED ORDER — PROPOFOL 10 MG/ML IV BOLUS
INTRAVENOUS | Status: DC | PRN
  Administered 2019-12-17: 60 mg via INTRAVENOUS

## 2019-12-17 NOTE — Transfer of Care (Signed)
Immediate Anesthesia Transfer of Care Note  Patient: Melvin Ramirez  Procedure(s) Performed: CARDIOVERSION (N/A )  Patient Location: Endoscopy Unit  Anesthesia Type:General  Level of Consciousness: drowsy and patient cooperative  Airway & Oxygen Therapy: Patient Spontanous Breathing and Patient connected to nasal cannula oxygen  Post-op Assessment: Report given to RN and Post -op Vital signs reviewed and stable  Post vital signs: Reviewed and stable  Last Vitals:  Vitals Value Taken Time  BP 105/65 12/17/19 0747  Temp    Pulse 77 12/17/19 0747  Resp 16 12/17/19 0747  SpO2 99 % 12/17/19 0747  Vitals shown include unvalidated device data.  Last Pain:  Vitals:   12/17/19 0656  TempSrc: Oral  PainSc: 0-No pain         Complications: No apparent anesthesia complications

## 2019-12-17 NOTE — Anesthesia Postprocedure Evaluation (Signed)
Anesthesia Post Note  Patient: Melvin Ramirez  Procedure(s) Performed: CARDIOVERSION (N/A )     Patient location during evaluation: Endoscopy Anesthesia Type: General Level of consciousness: awake and alert Pain management: pain level controlled Vital Signs Assessment: post-procedure vital signs reviewed and stable Respiratory status: spontaneous breathing, nonlabored ventilation, respiratory function stable and patient connected to nasal cannula oxygen Cardiovascular status: blood pressure returned to baseline and stable Postop Assessment: no apparent nausea or vomiting Anesthetic complications: no    Last Vitals:  Vitals:   12/17/19 0800 12/17/19 0810  BP: 101/63 110/67  Pulse: 77 78  Resp: 19 18  Temp:    SpO2: 100% 100%    Last Pain:  Vitals:   12/17/19 0750  TempSrc: Axillary  PainSc: 0-No pain                 Barnet Glasgow

## 2019-12-17 NOTE — Discharge Instructions (Signed)
Electrical Cardioversion Electrical cardioversion is the delivery of a jolt of electricity to restore a normal rhythm to the heart. A rhythm that is too fast or is not regular keeps the heart from pumping well. In this procedure, sticky patches or metal paddles are placed on the chest to deliver electricity to the heart from a device. This procedure may be done in an emergency if:  There is low or no blood pressure as a result of the heart rhythm.  Normal rhythm must be restored as fast as possible to protect the brain and heart from further damage.  It may save a life. This may also be a scheduled procedure for irregular or fast heart rhythms that are not immediately life-threatening. Tell a health care provider about:  Any allergies you have.  All medicines you are taking, including vitamins, herbs, eye drops, creams, and over-the-counter medicines.  Any problems you or family members have had with anesthetic medicines.  Any blood disorders you have.  Any surgeries you have had.  Any medical conditions you have.  Whether you are pregnant or may be pregnant. What are the risks? Generally, this is a safe procedure. However, problems may occur, including:  Allergic reactions to medicines.  A blood clot that breaks free and travels to other parts of your body.  The possible return of an abnormal heart rhythm within hours or days after the procedure.  Your heart stopping (cardiac arrest). This is rare. What happens before the procedure? Medicines  Your health care provider may have you start taking: ? Blood-thinning medicines (anticoagulants) so your blood does not clot as easily. ? Medicines to help stabilize your heart rate and rhythm.  Ask your health care provider about: ? Changing or stopping your regular medicines. This is especially important if you are taking diabetes medicines or blood thinners. ? Taking medicines such as aspirin and ibuprofen. These medicines can  thin your blood. Do not take these medicines unless your health care provider tells you to take them. ? Taking over-the-counter medicines, vitamins, herbs, and supplements. General instructions  Follow instructions from your health care provider about eating or drinking restrictions.  Plan to have someone take you home from the hospital or clinic.  If you will be going home right after the procedure, plan to have someone with you for 24 hours.  Ask your health care provider what steps will be taken to help prevent infection. These may include washing your skin with a germ-killing soap. What happens during the procedure?   An IV will be inserted into one of your veins.  Sticky patches (electrodes) or metal paddles may be placed on your chest.  You will be given a medicine to help you relax (sedative).  An electrical shock will be delivered. The procedure may vary among health care providers and hospitals. What can I expect after the procedure?  Your blood pressure, heart rate, breathing rate, and blood oxygen level will be monitored until you leave the hospital or clinic.  Your heart rhythm will be watched to make sure it does not change.  You may have some redness on the skin where the shocks were given. Follow these instructions at home:  Do not drive for 24 hours if you were given a sedative during your procedure.  Take over-the-counter and prescription medicines only as told by your health care provider.  Ask your health care provider how to check your pulse. Check it often.  Rest for 48 hours after the procedure or   as told by your health care provider.  Avoid or limit your caffeine use as told by your health care provider.  Keep all follow-up visits as told by your health care provider. This is important. Contact a health care provider if:  You feel like your heart is beating too quickly or your pulse is not regular.  You have a serious muscle cramp that does not go  away. Get help right away if:  You have discomfort in your chest.  You are dizzy or you feel faint.  You have trouble breathing or you are short of breath.  Your speech is slurred.  You have trouble moving an arm or leg on one side of your body.  Your fingers or toes turn cold or blue. Summary  Electrical cardioversion is the delivery of a jolt of electricity to restore a normal rhythm to the heart.  This procedure may be done right away in an emergency or may be a scheduled procedure if the condition is not an emergency.  Generally, this is a safe procedure.  After the procedure, check your pulse often as told by your health care provider. This information is not intended to replace advice given to you by your health care provider. Make sure you discuss any questions you have with your health care provider. Document Revised: 04/27/2019 Document Reviewed: 04/27/2019 Elsevier Patient Education  2020 Elsevier Inc.  

## 2019-12-17 NOTE — CV Procedure (Signed)
   DIRECT CURRENT CARDIOVERSION  NAME:  Melvin Ramirez    MRN: [id number] DOB:  Dec 12, 1945    ADMIT DATE: 12/17/2019  Indication:  Symptomatic atrial fibrillation  Procedure Note:  The patient signed informed consent.  They have had had therapeutic anticoagulation with eliquis greater than 3 weeks.  Anesthesia was administered by Dr. Valma Cava.  Adequate airway was maintained throughout and vital followed per protocol.  They were cardioverted x 1 with 200J of biphasic synchronized energy.  They converted to NSR.  There were no apparent complications.  The patient had normal neuro status and respiratory status post procedure with vitals stable as recorded elsewhere.    Follow up: They will continue on current medical therapy and follow up with cardiology as scheduled.  Lake Bells T. Audie Box, [place]  [address] [place], [place] [address] [phone]  7:43 AM

## 2019-12-17 NOTE — Interval H&P Note (Signed)
History and Physical Interval Note:  12/17/2019 7:21 AM  Melvin Ramirez  has presented today for surgery, with the diagnosis of A-FIB.  The various methods of treatment have been discussed with the patient and family. After consideration of risks, benefits and other options for treatment, the patient has consented to  Procedure(s): CARDIOVERSION (N/A) as a surgical intervention.  The patient's history has been reviewed, patient examined, no change in status, stable for surgery.  I have reviewed the patient's chart and labs.  Questions were answered to the patient's satisfaction.    Patient with symptomatic atrial fibrillation. On eliquis for 3+ weeks without missed doses. Proceed with DCCV.   [place] Bells T. Audie Box, [place]  [address] [place], [place] [address] [phone]  7:21 AM

## 2019-12-18 ENCOUNTER — Encounter: Payer: Self-pay | Admitting: *Deleted

## 2019-12-31 ENCOUNTER — Encounter: Payer: Self-pay | Admitting: *Deleted

## 2019-12-31 ENCOUNTER — Ambulatory Visit (INDEPENDENT_AMBULATORY_CARE_PROVIDER_SITE_OTHER): Payer: Commercial Managed Care - PPO | Admitting: Cardiovascular Disease

## 2019-12-31 ENCOUNTER — Other Ambulatory Visit: Payer: Self-pay

## 2019-12-31 VITALS — BP 112/62 | HR 88 | Ht 74.0 in | Wt 228.8 lb

## 2019-12-31 DIAGNOSIS — R9430 Abnormal result of cardiovascular function study, unspecified: Secondary | ICD-10-CM

## 2019-12-31 DIAGNOSIS — Z5181 Encounter for therapeutic drug level monitoring: Secondary | ICD-10-CM

## 2019-12-31 DIAGNOSIS — R931 Abnormal findings on diagnostic imaging of heart and coronary circulation: Secondary | ICD-10-CM | POA: Diagnosis not present

## 2019-12-31 DIAGNOSIS — I4819 Other persistent atrial fibrillation: Secondary | ICD-10-CM

## 2019-12-31 DIAGNOSIS — I5042 Chronic combined systolic (congestive) and diastolic (congestive) heart failure: Secondary | ICD-10-CM | POA: Diagnosis not present

## 2019-12-31 DIAGNOSIS — N183 Chronic kidney disease, stage 3 unspecified: Secondary | ICD-10-CM

## 2019-12-31 NOTE — Patient Instructions (Signed)
Medication Instructions:  Your physician recommends that you continue on your current medications as directed. Please refer to the Current Medication list given to you today.  *If you need a refill on your cardiac medications before your next appointment, please call your pharmacy*  Lab Work: BMET/MAG/DIGOXIN LEVEL   If you have labs (blood work) drawn today and your tests are completely normal, you will receive your results only by: Marland Kitchen MyChart Message (if you have MyChart) OR . A paper copy in the mail If you have any lab test that is abnormal or we need to change your treatment, we will call you to review the results.  Testing/Procedures: Your physician has requested that you have an echocardiogram. Echocardiography is a painless test that uses sound waves to create images of your heart. It provides your doctor with information about the size and shape of your heart and how well your heart's chambers and valves are working. This procedure takes approximately one hour. There are no restrictions for this procedure. SO[place]  [place] [address]  Follow-Up: At [facility], you and your health needs are our priority.  As part of our continuing mission to provide you with exceptional heart care, we have created designated Provider Care Teams.  These Care Teams include your primary Cardiologist (physician) and Advanced Practice Providers (APPs -  Physician Assistants and Nurse Practitioners) who all work together to provide you with the care you need, when you need it.  We recommend signing up for the patient portal called "MyChart".  Sign up information is provided on this After Visit Summary.  MyChart is used to connect with patients for Virtual Visits (Telemedicine).  Patients are able to view lab/test results, encounter notes, upcoming appointments, etc.  Non-urgent messages can be sent to your provider as well.   To learn more about what you can do with MyChart, go to  [email or web address].    Your next appointment:   3 month(s) You will receive a reminder letter in the mail two months in advance. If you don't receive a letter, please call our office to schedule the follow-up appointment.  The format for your next appointment:   In Person  Provider:   You may see DR [facility]  or one of the following Advanced Practice Providers on your designated Care Team:    Kerin Ransom, PA-C  Vera Cruz, [place]  Coletta Memos, [place]    You have been referred to [place] have been referred to ELECTROPHYSIOLOGIST AT [place] [address] [address]

## 2019-12-31 NOTE — Progress Notes (Addendum)
Cardiology Office Note   Date:  01/02/2020   ID:  KAYLE CORREA, DOB [birth year], MRN [id number]  PCP:  [name], MD  Cardiologist:   Skeet Latch, MD   No chief complaint on file.    History of Present Illness: Melvin Ramirez is a 74 y.o. male with paroxysmal atrial fibrillation, chronic systolic and diastolic heart failure, DM, CKD 3, RCC s/p nephrectomy, and obesity here to establish care.  He was recently admitted 11/2027 with GI bleeding and atrial fibrillation in the setting of recent COVID pneumonia.  He had COVID pneumonia approximately 1 month prior to admission.  He subsequently developed palpitations and cough.  His heart rate was in the 160s and he was found to be in atrial fibrillation.  Thyroid function was normal.  He was started on anticoagulation and developed bleeding gastric ulcers that required clipping.  He was found to be positive for H. Pylori.  He was subsequently started on Eliquis.  Echo that admission revealed LVEF 25 to 30% with global hypokinesis.  Hypokinesis was worse than the anterior myocardium.  RV function was mildly reduced.  High sensitivity troponin was mildly elevated to 39.   Plans were made for outpatient LHC given his GIB.  During that hospitalization he had LE Doppler that was indeterminate for R popliteal vein.  At his last appointment Mr. Dustan was set up for DCCV.  He underwent successful DCCV on 12/17/19.  He noticed that he had a little more energy post cardioversion.  He denies any chest pain and his breathing has been stable.  He denies lower extremity edema, orthopnea or PND.  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  He had mild ankle edema which has improved since doubling torsemide.  He has no orthopnea or PND.  He isn't getting much exercise.  He denies palpitations, lightheadedness or dizziness.  His wife checks his vitals daily.  His BP has ranged 100-120/60s-70s and his heart rate is in the 50s.      Past Medical History:  Diagnosis Date  . Chronic combined systolic and diastolic heart failure ([place]) 12/13/2019  . Chronic kidney disease   . CKD (chronic kidney disease) stage 3, GFR 30-59 ml/min 12/13/2019  . Diabetes mellitus ([place])   . Gastric ulcer 12/13/2019   +H. Pylori. Bleeding required clipping.  . H/O blood clots   . History of kidney cancer   . Persistent atrial fibrillation ([place]) 12/13/2019  . Renal cell carcinoma ([place]) 12/13/2019   S/p nephrectomy    Past Surgical History:  Procedure Laterality Date  . APPENDECTOMY    . BIOPSY  11/19/2019   Procedure: BIOPSY;  Surgeon: Ronald Lobo, MD;  Location: [facility];  Service: Endoscopy;;  . CARDIOVERSION N/A 12/17/2019   Procedure: CARDIOVERSION;  Surgeon: Geralynn Rile, MD;  Location: [facility];  Service: Cardiovascular;  Laterality: N/A;  . ESOPHAGOGASTRODUODENOSCOPY N/A 11/19/2019   Procedure: ESOPHAGOGASTRODUODENOSCOPY (EGD);  Surgeon: Ronald Lobo, MD;  Location: [facility] ENDOSCOPY;  Service: Endoscopy;  Laterality: N/A;  . HEMOSTASIS CLIP PLACEMENT  11/19/2019   Procedure: HEMOSTASIS CLIP PLACEMENT;  Surgeon: Ronald Lobo, MD;  Location: [facility];  Service: Endoscopy;;  . NEPHRECTOMY       Current Outpatient Medications  Medication Sig Dispense Refill  . apixaban (ELIQUIS) 5 MG TABS tablet Take 1 tablet (5 mg total) by mouth 2 (two) times daily. 60 tablet 2  . ascorbic acid (V-R VITAMIN C) 500  MG tablet Take 500 mg by mouth every evening.    Marland Kitchen atorvastatin (LIPITOR) 10 MG tablet Take 10 mg by mouth daily.    . carvedilol (COREG) 6.25 MG tablet Take 1 tablet (6.25 mg total) by mouth 2 (two) times daily with a meal. 60 tablet 6  . digoxin (LANOXIN) 0.125 MG tablet Take 0.125 mg by mouth daily.     . ferrous sulfate 325 (65 FE) MG tablet Take 1 tablet (325 mg total) by mouth 2 (two) times daily with a meal.    . Garlic 1093 MG CAPS Take 1,000 mg by mouth every evening.    . Glucosamine HCl 1000 MG TABS Take  1,000 mg by mouth every evening.    Marland Kitchen guaiFENesin-dextromethorphan (ROBITUSSIN DM) 100-10 MG/5ML syrup Take 5 mLs by mouth every 4 (four) hours as needed for cough. 118 mL 0  . Multiple Vitamin (MULTIVITAMIN WITH MINERALS) TABS tablet Take 1 tablet by mouth every evening.    . Omega-3 Fatty Acids (FISH OIL) 1000 MG CAPS Take 1,000 mg by mouth every evening.     . pantoprazole (PROTONIX) 40 MG tablet Take 1 tablet (40 mg total) by mouth 2 (two) times daily. 60 tablet 3  . potassium chloride (KLOR-CON) 10 MEQ tablet Take 1 tablet (10 mEq total) by mouth daily. 30 tablet 3  . Saw Palmetto 450 MG CAPS Take 450 mg by mouth every evening.     No current facility-administered medications for this visit.    Allergies:   Novocain [procaine]    Social History:  The patient  reports that he quit smoking about 21 years ago. His smoking use included cigarettes. He has a 70.00 pack-year smoking history. He has never used smokeless tobacco. He reports current alcohol use. He reports that he does not use drugs.   Family History:  The patient's family history includes Cancer in his mother; Heart attack in his brother and father; Heart disease in his sister; Stroke in his paternal grandmother.    ROS:  Please see the history of present illness.   Otherwise, review of systems are positive for none.   All other systems are reviewed and negative.    PHYSICAL EXAM: VS:  BP 112/62   Pulse 88   Ht 6\' 2"  (1.88 m)   Wt 228 lb 12.8 oz (103.8 kg)   SpO2 95%   BMI 29.38 kg/m  , BMI Body mass index is 29.38 kg/m. GENERAL:  Well appearing HEENT:  Pupils equal round and reactive, fundi not visualized, oral mucosa unremarkable NECK:  No jugular venous distention, waveform within normal limits, carotid upstroke brisk and symmetric, no bruits LUNGS:  Clear to auscultation bilaterally HEART:  Irregularly irregular.  PMI not displaced or sustained,S1 and S2 within normal limits, no S3, no S4, no clicks, no rubs, no  murmurs ABD:  Flat, positive bowel sounds normal in frequency in pitch, no bruits, no rebound, no guarding, no midline pulsatile mass, no hepatomegaly, no splenomegaly EXT:  2 plus pulses throughout, no edema, no cyanosis no clubbing SKIN:  No rashes no nodules NEURO:  Cranial nerves II through XII grossly intact, motor grossly intact throughout PSYCH:  Cognitively intact, oriented to person place and time   EKG:  EKG is ordered today. The ekg ordered today demonstrates atrial fibrillation.  Frequent PVCs.  Rate 88 bpm.  Echo 11/16/19:  1. Left ventricular ejection fraction, by estimation, is 25 to 30%. The  left ventricle has severely decreased function. The left ventrical  demonstrates global hypokinesis. The left ventricular internal cavity size  was mildly dilated. Indeterminate  diastolic filling due to E-A fusion. There is severe hypokinesis of the  left ventricular, entire anterior wall. There is mild hypokinesis of the  left ventricular, entire inferior wall.  2. Right ventricular systolic function is mildly reduced. The right  ventricular size is mildly enlarged. There is moderately elevated  pulmonary artery systolic pressure.  3. Moderate mitral valve regurgitation.  4. Tricuspid valve regurgitation is moderate.  5. The aortic valve is tricuspid. Aortic valve regurgitation is mild.   Recent Labs: 11/15/2019: TSH 1.647 11/25/2019: ALT 167 12/14/2019: Hemoglobin 11.7; Platelets 263 12/31/2019: BUN 23; Creatinine, Ser 1.76; Magnesium 1.7; Potassium 4.1; Sodium 139    Lipid Panel No results found for: CHOL, TRIG, HDL, CHOLHDL, VLDL, LDLCALC, LDLDIRECT    Wt Readings from Last 3 Encounters:  12/31/19 228 lb 12.8 oz (103.8 kg)  12/17/19 229 lb (103.9 kg)  12/08/19 225 lb (102.1 kg)      ASSESSMENT AND PLAN:  # Chronic systolic and diastolic heart failure: LVEF 25-30% with wall motion abnormalities noted (11/16/19) in the setting of COVID-19 pneumonia.  Given that he  had no symptoms of ischemia and a history of RCC s/p nephrectomy he was referred for [facility] which was negative for ischemia but was concerning for prior inferior infarct vs diaphragmatic attenuation.  He was in atrial fibrillation so it wasn't gated.  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  He is euvolemic and has no HF symptoms.  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  We are hopeful that this will be 2/2 COVID and will recover.  We will get an echo now, as if his LVEF has recovered he is OK to resume driving.  If LVEF remains low we will have him seen by advanced HF clinic.  Check digoxin level, BMP and magnesium.  Increasing digoxin as above.  Continue carvedilol and torsemide.  He isn't on an ARB or Entresto 2/2 CKD with acute on chronic renal failure in the hospital.   # Hyperlipidemia:  Continue atorvastatin.   # Persistent atrial fibrillation: Mr. Shravan initially went into atrial fibrillation in the setting of COVID pneumonia.  Rates were poorly controlled and his BP was low so digoxin was added in addition to carvedilol.  He went back in afib after DCCV.  Refer to afib clinic to discuss options.  Check digoxin level as above.  # Upper GI Bleed:  Mr. Kittelson had a GI bleed after starting Eliquis.  He was found to be positive for H. Pylori and required clipping at endoscopy.  He denies any recurrent melena.  H/h is stable and he denies recurrent bleeding.    # R LE DVT: Occurred in the post-COVID period.  Continue Eliquis.    # Hyperlipidemia: Continue atorvastatin.   Current medicines are reviewed at length with the patient today.  The patient does not have concerns regarding medicines.  The following changes have been made:   Labs/ tests ordered today include:   Orders Placed This Encounter  Procedures  . Digoxin level  . Basic metabolic panel  . Magnesium  . Ambulatory referral to Cardiac  Electrophysiology  . AMB referral to CHF clinic  . EKG 12-Lead  . ECHOCARDIOGRAM COMPLETE     Disposition:   FU with Ramya Vanbergen C. Oval Linsey, MD, [facility] in 1 month.     Signed, Aliayah Tyer C. Oval Linsey,  MD, [facility]  01/02/2020 2:06 PM    [facility]

## 2020-01-01 LAB — MAGNESIUM: Magnesium: 1.7 mg/dL (ref 1.6–2.3)

## 2020-01-01 LAB — BASIC METABOLIC PANEL
BUN/Creatinine Ratio: 13 (ref 10–24)
BUN: 23 mg/dL (ref 8–27)
CO2: 25 mmol/L (ref 20–29)
Calcium: 9.3 mg/dL (ref 8.6–10.2)
Chloride: 98 mmol/L (ref 96–106)
Creatinine, Ser: 1.76 mg/dL — ABNORMAL HIGH (ref 0.76–1.27)
GFR calc Af Amer: 44 mL/min/{1.73_m2} — ABNORMAL LOW (ref >59)
GFR calc non Af Amer: 38 mL/min/{1.73_m2} — ABNORMAL LOW (ref >59)
Glucose: 186 mg/dL — ABNORMAL HIGH (ref 65–99)
Potassium: 4.1 mmol/L (ref 3.5–5.2)
Sodium: 139 mmol/L (ref 134–144)

## 2020-01-01 LAB — DIGOXIN LEVEL: Digoxin, Serum: 0.9 ng/mL (ref 0.5–0.9)

## 2020-01-02 ENCOUNTER — Encounter: Payer: Self-pay | Admitting: Cardiovascular Disease

## 2020-01-05 ENCOUNTER — Other Ambulatory Visit: Payer: Self-pay

## 2020-01-05 ENCOUNTER — Ambulatory Visit (HOSPITAL_COMMUNITY)
Admission: RE | Admit: 2020-01-05 | Discharge: 2020-01-05 | Disposition: A | Discharge status: Home / Self Care | Payer: Commercial Managed Care - PPO | Source: Ambulatory Visit | Attending: Cardiovascular Disease | Admitting: Cardiovascular Disease

## 2020-01-05 DIAGNOSIS — C649 Malignant neoplasm of unspecified kidney, except renal pelvis: Secondary | ICD-10-CM | POA: Insufficient documentation

## 2020-01-05 DIAGNOSIS — E119 Type 2 diabetes mellitus without complications: Secondary | ICD-10-CM | POA: Diagnosis not present

## 2020-01-05 DIAGNOSIS — I509 Heart failure, unspecified: Secondary | ICD-10-CM | POA: Diagnosis not present

## 2020-01-05 DIAGNOSIS — I083 Combined rheumatic disorders of mitral, aortic and tricuspid valves: Secondary | ICD-10-CM | POA: Insufficient documentation

## 2020-01-05 DIAGNOSIS — I4891 Unspecified atrial fibrillation: Secondary | ICD-10-CM | POA: Diagnosis not present

## 2020-01-05 DIAGNOSIS — I5042 Chronic combined systolic (congestive) and diastolic (congestive) heart failure: Secondary | ICD-10-CM | POA: Diagnosis not present

## 2020-01-05 NOTE — Progress Notes (Signed)
  Echocardiogram 2D Echocardiogram has been performed.  Melvin Ramirez M 01/05/2020, 2:27 PM

## 2020-01-14 ENCOUNTER — Telehealth: Payer: Self-pay | Admitting: Cardiovascular Disease

## 2020-01-14 DIAGNOSIS — I4819 Other persistent atrial fibrillation: Secondary | ICD-10-CM

## 2020-01-14 NOTE — Telephone Encounter (Signed)
Pts wife advised pt to be seen in the Afib clinic with R Fenton 01/18/20 at [place].

## 2020-01-14 NOTE — Telephone Encounter (Signed)
Pts wife called to report the pt is still out of rhythm and he is feeling well but it bothers him and they are frustrated not knowing what the next step is. Per Dr. Blenda Mounts 12/31/19 Office note pt was to be referred to the Afib clinic for management and options.. the pt and his wife both agree to an appt. Will place the referral and call to make his appt.

## 2020-01-14 NOTE — Telephone Encounter (Signed)
New message     Patient had cardioversion on 12-17-19.  He is still out of rhythm.  Patient want to know if he needs another cardioversion or ablation or what to do so that he can return to "normal" life.  He has an appt to be seen in the CHF clinic in may but they will not do the cardioversion or ablation.

## 2020-01-18 ENCOUNTER — Other Ambulatory Visit: Payer: Self-pay

## 2020-01-18 ENCOUNTER — Inpatient Hospital Stay: Admission: RE | Admit: 2020-01-18 | Payer: Commercial Managed Care - PPO | Source: Ambulatory Visit

## 2020-01-18 ENCOUNTER — Ambulatory Visit (HOSPITAL_COMMUNITY)
Admission: RE | Admit: 2020-01-18 | Discharge: 2020-01-18 | Disposition: A | Discharge status: Home / Self Care | Payer: Commercial Managed Care - PPO | Source: Ambulatory Visit | Attending: Physician Assistant | Admitting: Physician Assistant

## 2020-01-18 ENCOUNTER — Encounter (HOSPITAL_COMMUNITY): Payer: Self-pay | Admitting: Physician Assistant

## 2020-01-18 ENCOUNTER — Telehealth: Payer: Self-pay | Admitting: Pharmacist

## 2020-01-18 ENCOUNTER — Encounter (HOSPITAL_COMMUNITY): Payer: Self-pay

## 2020-01-18 VITALS — BP 136/50 | HR 70 | Ht 74.0 in | Wt 246.6 lb

## 2020-01-18 DIAGNOSIS — E669 Obesity, unspecified: Secondary | ICD-10-CM | POA: Diagnosis not present

## 2020-01-18 DIAGNOSIS — Z85528 Personal history of other malignant neoplasm of kidney: Secondary | ICD-10-CM | POA: Insufficient documentation

## 2020-01-18 DIAGNOSIS — Z87891 Personal history of nicotine dependence: Secondary | ICD-10-CM | POA: Diagnosis not present

## 2020-01-18 DIAGNOSIS — I5042 Chronic combined systolic (congestive) and diastolic (congestive) heart failure: Secondary | ICD-10-CM | POA: Insufficient documentation

## 2020-01-18 DIAGNOSIS — I4819 Other persistent atrial fibrillation: Secondary | ICD-10-CM | POA: Diagnosis not present

## 2020-01-18 DIAGNOSIS — Z7901 Long term (current) use of anticoagulants: Secondary | ICD-10-CM | POA: Insufficient documentation

## 2020-01-18 DIAGNOSIS — Z6831 Body mass index (BMI) 31.0-31.9, adult: Secondary | ICD-10-CM | POA: Diagnosis not present

## 2020-01-18 DIAGNOSIS — N183 Chronic kidney disease, stage 3 unspecified: Secondary | ICD-10-CM | POA: Insufficient documentation

## 2020-01-18 DIAGNOSIS — D6869 Other thrombophilia: Secondary | ICD-10-CM | POA: Insufficient documentation

## 2020-01-18 DIAGNOSIS — E1122 Type 2 diabetes mellitus with diabetic chronic kidney disease: Secondary | ICD-10-CM | POA: Diagnosis not present

## 2020-01-18 DIAGNOSIS — Z79899 Other long term (current) drug therapy: Secondary | ICD-10-CM | POA: Insufficient documentation

## 2020-01-18 DIAGNOSIS — Z905 Acquired absence of kidney: Secondary | ICD-10-CM | POA: Diagnosis not present

## 2020-01-18 DIAGNOSIS — Z8616 Personal history of COVID-19: Secondary | ICD-10-CM | POA: Insufficient documentation

## 2020-01-18 NOTE — Telephone Encounter (Signed)
Medication list reviewed in anticipation of upcoming Tikosyn initiation. Patient is not taking any contraindicated or QTc prolonging medications.   Patient is anticoagulated on Eliquis 5mg BID on the appropriate dose. Please ensure that patient has not missed any anticoagulation doses in the 3 weeks prior to Tikosyn initiation.   Patient will need to be counseled to avoid use of Benadryl while on Tikosyn and in the 2-3 days prior to Tikosyn initiation. 

## 2020-01-18 NOTE — Progress Notes (Signed)
Primary Care Physician: [name], MD Primary Cardiologist: Dr Oval Linsey Primary Electrophysiologist: none Referring Physician: Dr Debbe Odea is a 74 y.o. male with a history of persistent atrial fibrillation, chronic systolic and diastolic heart failure, DM, CKD 3, RCC s/p nephrectomy, and obesity who presents for consult in the [facility].  He was admitted 11/2019 with GI bleeding and atrial fibrillation in the setting of recent COVID pneumonia.  He had COVID pneumonia approximately 1 month prior to admission.  He subsequently developed palpitations and cough.  His heart rate was in the 160s and he was found to be in atrial fibrillation.  Thyroid function was normal.  He was started on anticoagulation and developed bleeding gastric ulcers that required clipping.  He was found to be positive for H. Pylori.  He was subsequently started on Eliquis for a CHADS2VASC score of 3.  Echo that admission revealed LVEF 25 to 30% with global hypokinesis.  Hypokinesis was worse than the anterior myocardium. He underwent successful DCCV on 12/17/19. Unfortunately, he had ERAF after DCCV. Repeat echo showed EF 30-35%. Patient is unaware of his arrhythmia with no heart racing, SOB, or dizziness.   Today, he denies symptoms of palpitations, chest pain, shortness of breath, orthopnea, PND, lower extremity edema, dizziness, presyncope, syncope, snoring, daytime somnolence, bleeding, or neurologic sequela. The patient is tolerating medications without difficulties and is otherwise without complaint today.    Atrial Fibrillation Risk Factors:  he does not have symptoms or diagnosis of sleep apnea. he does not have a history of rheumatic fever.   he has a BMI of Body mass index is 31.66 kg/m.Marland Kitchen Filed Weights   01/18/20 0850  Weight: 111.9 kg    Family History  Problem Relation Age of Onset  . Cancer Mother   . Heart attack Father   . Heart attack Brother   .  Heart disease Sister   . Stroke Paternal Grandmother      Atrial Fibrillation Management history:  Previous antiarrhythmic drugs: none Previous cardioversions: 12/17/19 Previous ablations: none CHADS2VASC score: 3 Anticoagulation history: Eliquis    Past Medical History:  Diagnosis Date  . Chronic combined systolic and diastolic heart failure ([place]) 12/13/2019  . Chronic kidney disease   . CKD (chronic kidney disease) stage 3, GFR 30-59 ml/min 12/13/2019  . Diabetes mellitus ([place])   . Gastric ulcer 12/13/2019   +H. Pylori. Bleeding required clipping.  . H/O blood clots   . History of kidney cancer   . Persistent atrial fibrillation ([place]) 12/13/2019  . Renal cell carcinoma ([place]) 12/13/2019   S/p nephrectomy   Past Surgical History:  Procedure Laterality Date  . APPENDECTOMY    . BIOPSY  11/19/2019   Procedure: BIOPSY;  Surgeon: Ronald Lobo, MD;  Location: [place];  Service: Endoscopy;;  . CARDIOVERSION N/A 12/17/2019   Procedure: CARDIOVERSION;  Surgeon: Geralynn Rile, MD;  Location: [place];  Service: Cardiovascular;  Laterality: N/A;  . ESOPHAGOGASTRODUODENOSCOPY N/A 11/19/2019   Procedure: ESOPHAGOGASTRODUODENOSCOPY (EGD);  Surgeon: Ronald Lobo, MD;  Location: [facility] ENDOSCOPY;  Service: Endoscopy;  Laterality: N/A;  . HEMOSTASIS CLIP PLACEMENT  11/19/2019   Procedure: HEMOSTASIS CLIP PLACEMENT;  Surgeon: Ronald Lobo, MD;  Location: [place];  Service: Endoscopy;;  . NEPHRECTOMY      Current Outpatient Medications  Medication Sig Dispense Refill  . apixaban (ELIQUIS) 5 MG TABS tablet Take 1 tablet (5 mg total) by mouth 2 (two) times daily. 60 tablet 2  .  ascorbic acid (V-R VITAMIN C) 500 MG tablet Take 500 mg by mouth every evening.    Marland Kitchen atorvastatin (LIPITOR) 10 MG tablet Take 10 mg by mouth daily.    . carvedilol (COREG) 6.25 MG tablet Take 1 tablet (6.25 mg total) by mouth 2 (two) times daily with a meal. 60 tablet 6  . digoxin (LANOXIN) 0.125 MG  tablet Take 0.125 mg by mouth daily.     . ferrous sulfate 325 (65 FE) MG tablet Take 1 tablet (325 mg total) by mouth 2 (two) times daily with a meal.    . Garlic 2725 MG CAPS Take 1,000 mg by mouth every evening.    . Glucosamine HCl 1000 MG TABS Take 1,000 mg by mouth every evening.    . Multiple Vitamin (MULTIVITAMIN WITH MINERALS) TABS tablet Take 1 tablet by mouth every evening.    . Omega-3 Fatty Acids (FISH OIL) 1000 MG CAPS Take 1,000 mg by mouth every evening.     . pantoprazole (PROTONIX) 40 MG tablet Take 1 tablet (40 mg total) by mouth 2 (two) times daily. 60 tablet 3  . potassium chloride (KLOR-CON) 10 MEQ tablet Take 1 tablet (10 mEq total) by mouth daily. 30 tablet 3  . Saw Palmetto 450 MG CAPS Take 450 mg by mouth every evening.     No current facility-administered medications for this encounter.    Allergies  Allergen Reactions  . Novocain [Procaine] Other (See Comments)    Syncope (passed out)     Social History   Socioeconomic History  . Marital status: Married    Spouse name: Not on file  . Number of children: Not on file  . Years of education: Not on file  . Highest education level: Not on file  Occupational History  . Occupation: truck Geophysicist/field seismologist  Tobacco Use  . Smoking status: Former Smoker    Packs/day: 2.00    Years: 35.00    Pack years: 70.00    Types: Cigarettes    Quit date: 2000    Years since quitting: 21.2  . Smokeless tobacco: Never Used  Substance and Sexual Activity  . Alcohol use: Yes    Alcohol/week: 1.0 - 2.0 standard drinks    Types: 1 - 2 Cans of beer per week    Comment: Socially  . Drug use: No  . Sexual activity: Not on file  Other Topics Concern  . Not on file  Social History Narrative   Lives with wife in a one story home.  Has 3 children.     Works as a Administrator.     Education: 12th grade.   Social Determinants of Health   Financial Resource Strain:   . Difficulty of Paying Living Expenses:   Food Insecurity:   .  Worried About Charity fundraiser in the Last Year:   . Arboriculturist in the Last Year:   Transportation Needs:   . Film/video editor (Medical):   Marland Kitchen Lack of Transportation (Non-Medical):   Physical Activity:   . Days of Exercise per Week:   . Minutes of Exercise per Session:   Stress:   . Feeling of Stress :   Social Connections:   . Frequency of Communication with Friends and Family:   . Frequency of Social Gatherings with Friends and Family:   . Attends Religious Services:   . Active Member of Clubs or Organizations:   . Attends Archivist Meetings:   Marland Kitchen Marital Status:  Intimate Partner Violence:   . Fear of Current or Ex-Partner:   . Emotionally Abused:   Marland Kitchen Physically Abused:   . Sexually Abused:      ROS- All systems are reviewed and negative except as per the HPI above.  Physical Exam: Vitals:   01/18/20 0850  BP: (!) 136/50  Pulse: 70  Weight: 111.9 kg  Height: 6\' 2"  (1.88 m)    GEN- The patient is well appearing obese male, alert and oriented x 3 today.   Head- normocephalic, atraumatic Eyes-  Sclera clear, conjunctiva pink Ears- hearing intact Oropharynx- clear Neck- supple  Lungs- Clear to ausculation bilaterally, normal work of breathing Heart- irregular rate and rhythm, no murmurs, rubs or gallops  GI- soft, NT, ND, + BS Extremities- no clubbing, cyanosis, or edema MS- no significant deformity or atrophy Skin- no rash or lesion Psych- euthymic mood, full affect Neuro- strength and sensation are intact  Wt Readings from Last 3 Encounters:  01/18/20 111.9 kg  12/31/19 103.8 kg  12/17/19 103.9 kg    EKG today demonstrates afib HR 70, PVCs, LAFB, QRS 100, QTc 397  Echo 01/05/20 demonstrated  1. Left ventricular ejection fraction, by estimation, is 30 to 35%. The  left ventricle has moderately decreased function. The left ventricle  demonstrates global hypokinesis. Left ventricular diastolic function could  not be evaluated.  2.  Right ventricular systolic function is normal. The right ventricular  size is mildly enlarged. There is mildly elevated pulmonary artery  systolic pressure.  3. Left atrial size was moderately dilated.  4. Right atrial size was mildly dilated.  5. The mitral valve is normal in structure. Mild mitral valve  regurgitation. No evidence of mitral stenosis.  6. The aortic valve is tricuspid. Aortic valve regurgitation is trivial.  No aortic stenosis is present.  7. Aortic dilatation noted. There is mild dilatation of the ascending  aorta measuring 39 mm.  8. The inferior vena cava is normal in size with greater than 50%  respiratory variability, suggesting right atrial pressure of 3 mmHg.   LA 5.5 cm  Epic records are reviewed at length today  CHA2DS2-VASc Score = 3 The patient's score is based upon: CHF History: Yes HTN History: No Age : 76-74 Diabetes History: Yes Stroke History: No Vascular Disease History: No Gender: Male      ASSESSMENT AND PLAN: 1. Persistent Atrial Fibrillation (ICD10:  I48.19) The patient's CHA2DS2-VASc score is 3, indicating a 3.2% annual risk of stroke.   S/p DCCV 12/17/19 with ERAF General education about afib provided and questions answered. We discussed therapeutic options today including repeat DCCV and AAD. Would avoid Multaq and class 1C with CHF. Would also avoid amiodarone with persistent groundglass on CT, followed by pulmonary. D/w Dr Rayann Heman, could consider dofetilide. Patient would like to take time to consider his options.  Continue Coreg 6.25 mg BID Continue digoxin 0.125 mg daily Continue Eliquis 5 mg BID  2. Secondary Hypercoagulable State (ICD10:  D68.69) The patient is at significant risk for stroke/thromboembolism based upon his CHA2DS2-VASc Score of 3.  Continue Apixaban (Eliquis).   3. Obesity Body mass index is 31.66 kg/m. Lifestyle modification was discussed at length including regular exercise and weight reduction.  4.  Chronic systolic CHF EF mildly improved to 30-35%. No signs or symptoms of fluid overload.    Follow up with Dr Aundra Dubin as scheduled. Patient to call back with decision about dofetilide admission.    [facility]  [address] [place], [place] [address] [phone] 01/18/2020 4:11 PM

## 2020-01-19 ENCOUNTER — Ambulatory Visit (INDEPENDENT_AMBULATORY_CARE_PROVIDER_SITE_OTHER)
Admission: RE | Admit: 2020-01-19 | Discharge: 2020-01-19 | Disposition: A | Discharge status: Home / Self Care | Payer: Commercial Managed Care - PPO | Source: Ambulatory Visit | Attending: Emergency Medicine | Admitting: Emergency Medicine

## 2020-01-19 DIAGNOSIS — J189 Pneumonia, unspecified organism: Secondary | ICD-10-CM | POA: Diagnosis not present

## 2020-01-27 ENCOUNTER — Telehealth (HOSPITAL_COMMUNITY): Payer: Self-pay | Admitting: *Deleted

## 2020-01-27 NOTE — Telephone Encounter (Signed)
Inpatient admission authorization approved for 4/27 - auth # [id number]. Spoke with Abe People at [facility].

## 2020-01-29 ENCOUNTER — Other Ambulatory Visit (HOSPITAL_COMMUNITY)
Admission: RE | Admit: 2020-01-29 | Discharge: 2020-01-29 | Disposition: A | Discharge status: Home / Self Care | Payer: Commercial Managed Care - PPO | Source: Ambulatory Visit | Attending: Physician Assistant | Admitting: Physician Assistant

## 2020-01-29 DIAGNOSIS — U071 COVID-19: Secondary | ICD-10-CM | POA: Insufficient documentation

## 2020-01-29 DIAGNOSIS — Z01812 Encounter for preprocedural laboratory examination: Secondary | ICD-10-CM | POA: Insufficient documentation

## 2020-01-29 LAB — SARS CORONAVIRUS 2 (TAT 6-24 HRS): SARS Coronavirus 2: POSITIVE — AB

## 2020-01-30 ENCOUNTER — Telehealth: Payer: Self-pay | Admitting: Physician Assistant

## 2020-01-30 NOTE — Progress Notes (Signed)
Called [place] from the on-call service for Dr. Clearnce Hasten with + covid results for this pt. These are the current guidelines:  Positive Results for:  Asymptomatic: Procedure postponed and quarantine for 10 days. Symptomatic: Procedure postponed and quarantine for 14 days. Immunocompromised: Procedure postponed and quarantine for 20 days.  The pt will not be retested for 90 days from the + result.  Awaiting a call back with further instructions.

## 2020-01-30 NOTE — Telephone Encounter (Signed)
Called to discuss with patient about Covid symptoms and the use of bamlanivimab/etesevimab or casirivimab/imdevimab, a monoclonal antibody infusion for those with mild to moderate Covid symptoms and at a high risk of hospitalization.  Pt is qualified for this infusion at the [facility] infusion center due to Age > 65, CVD, HTN   Interestingly, pt had symptomatic covid in Jan 2021 and received the monoclonal antibody infusion. He has then been tested several times (on 2/7 and 3/8) with negative results. He was scheduled for a dofetelide admission this upcoming Tuesday and a screening Covid PCR test was completed. This came back positive. The patient is totally asymptomatic and feeling quite well. Will forward to admitting team as a FYI.  Angelena Form PA-C  [facility]

## 2020-02-02 ENCOUNTER — Inpatient Hospital Stay
Admission: AD | Admit: 2020-02-02 | Payer: Commercial Managed Care - PPO | Source: Ambulatory Visit | Admitting: Internal Medicine

## 2020-02-02 ENCOUNTER — Ambulatory Visit (HOSPITAL_COMMUNITY): Payer: Commercial Managed Care - PPO | Admitting: Physician Assistant

## 2020-02-05 NOTE — Telephone Encounter (Signed)
Contacted his insurance company- about the date of service change to 5/11.

## 2020-02-08 ENCOUNTER — Telehealth: Payer: Self-pay | Admitting: Emergency Medicine

## 2020-02-08 NOTE — Telephone Encounter (Signed)
[facility] policy states that a patient who tests positive for Covid cannot be retested for Covid until 90 days after the first positive test.  [facility] policy also states that we cannot perform a PFT without a negative covid test.   Therefore, the patient will have to reschedule 5/10 PFT until over 90 days from 4/23 covid test.    Attempted to call pt to make aware, line rang X2 then disconnected.  Called back but line again rang X2 then disconnected.  Wcb.

## 2020-02-09 NOTE — Telephone Encounter (Signed)
ATC x2, phone cut off both times.

## 2020-02-09 NOTE — Telephone Encounter (Signed)
ATC pt, line rang twice then disconnected. I tried calling x2. Will try back.

## 2020-02-09 NOTE — Telephone Encounter (Signed)
Melvin Ramirez wife is returning phone call. Melvin Ramirez phone number is [phone] c or [phone] h.

## 2020-02-10 NOTE — Telephone Encounter (Signed)
ATC pt, line rang twice then disconnected. Will try back.

## 2020-02-10 NOTE — Progress Notes (Deleted)
TC from patient's wife questioning need for retest prior to PFT. Patient tested positive early Jan 2021. Restested March 2021 neg test. Retested 01/29/2020  Tested positive. Per protocol pateint does not need to be retested for 90 days.

## 2020-02-11 NOTE — Telephone Encounter (Signed)
We have attempted to contact pt's wife several times with no success or call back from her. Per triage protocol, message will be closed.

## 2020-02-13 ENCOUNTER — Other Ambulatory Visit (HOSPITAL_COMMUNITY): Payer: Commercial Managed Care - PPO

## 2020-02-16 ENCOUNTER — Other Ambulatory Visit: Payer: Self-pay

## 2020-02-16 ENCOUNTER — Ambulatory Visit (HOSPITAL_COMMUNITY)
Admission: RE | Admit: 2020-02-16 | Discharge: 2020-02-16 | Disposition: A | Discharge status: Home / Self Care | Payer: Commercial Managed Care - PPO | Source: Ambulatory Visit | Attending: Physician Assistant | Admitting: Physician Assistant

## 2020-02-16 ENCOUNTER — Encounter (HOSPITAL_COMMUNITY): Payer: Self-pay | Admitting: Internal Medicine

## 2020-02-16 ENCOUNTER — Encounter (HOSPITAL_COMMUNITY): Payer: Self-pay | Admitting: Physician Assistant

## 2020-02-16 ENCOUNTER — Inpatient Hospital Stay (HOSPITAL_COMMUNITY)
Admission: AD | Admit: 2020-02-16 | Discharge: 2020-02-20 | DRG: 309 | Disposition: A | Discharge status: Home / Self Care | Payer: Commercial Managed Care - PPO | Attending: Internal Medicine | Admitting: Internal Medicine

## 2020-02-16 VITALS — BP 102/62 | HR 72 | Ht 74.0 in | Wt 233.0 lb

## 2020-02-16 DIAGNOSIS — N183 Chronic kidney disease, stage 3 unspecified: Secondary | ICD-10-CM | POA: Diagnosis present

## 2020-02-16 DIAGNOSIS — N179 Acute kidney failure, unspecified: Secondary | ICD-10-CM | POA: Diagnosis present

## 2020-02-16 DIAGNOSIS — Z87891 Personal history of nicotine dependence: Secondary | ICD-10-CM

## 2020-02-16 DIAGNOSIS — Z8249 Family history of ischemic heart disease and other diseases of the circulatory system: Secondary | ICD-10-CM | POA: Diagnosis not present

## 2020-02-16 DIAGNOSIS — Z85528 Personal history of other malignant neoplasm of kidney: Secondary | ICD-10-CM | POA: Diagnosis not present

## 2020-02-16 DIAGNOSIS — Z8616 Personal history of COVID-19: Secondary | ICD-10-CM

## 2020-02-16 DIAGNOSIS — I493 Ventricular premature depolarization: Secondary | ICD-10-CM | POA: Diagnosis present

## 2020-02-16 DIAGNOSIS — E1122 Type 2 diabetes mellitus with diabetic chronic kidney disease: Secondary | ICD-10-CM | POA: Diagnosis present

## 2020-02-16 DIAGNOSIS — Z7901 Long term (current) use of anticoagulants: Secondary | ICD-10-CM

## 2020-02-16 DIAGNOSIS — Z888 Allergy status to other drugs, medicaments and biological substances status: Secondary | ICD-10-CM | POA: Diagnosis not present

## 2020-02-16 DIAGNOSIS — I4819 Other persistent atrial fibrillation: Secondary | ICD-10-CM

## 2020-02-16 DIAGNOSIS — Z79899 Other long term (current) drug therapy: Secondary | ICD-10-CM | POA: Diagnosis not present

## 2020-02-16 DIAGNOSIS — E782 Mixed hyperlipidemia: Secondary | ICD-10-CM | POA: Diagnosis not present

## 2020-02-16 DIAGNOSIS — I5042 Chronic combined systolic (congestive) and diastolic (congestive) heart failure: Secondary | ICD-10-CM | POA: Diagnosis present

## 2020-02-16 DIAGNOSIS — D6869 Other thrombophilia: Secondary | ICD-10-CM | POA: Diagnosis present

## 2020-02-16 DIAGNOSIS — I519 Heart disease, unspecified: Secondary | ICD-10-CM | POA: Diagnosis not present

## 2020-02-16 DIAGNOSIS — Z905 Acquired absence of kidney: Secondary | ICD-10-CM

## 2020-02-16 LAB — BASIC METABOLIC PANEL
Anion gap: 9 (ref 5–15)
BUN: 27 mg/dL — ABNORMAL HIGH (ref 8–23)
CO2: 24 mmol/L (ref 22–32)
Calcium: 9.1 mg/dL (ref 8.9–10.3)
Chloride: 104 mmol/L (ref 98–111)
Creatinine, Ser: 2.04 mg/dL — ABNORMAL HIGH (ref 0.61–1.24)
GFR calc Af Amer: 37 mL/min — ABNORMAL LOW (ref >60)
GFR calc non Af Amer: 32 mL/min — ABNORMAL LOW (ref >60)
Glucose, Bld: 108 mg/dL — ABNORMAL HIGH (ref 70–99)
Potassium: 4.9 mmol/L (ref 3.5–5.1)
Sodium: 137 mmol/L (ref 135–145)

## 2020-02-16 LAB — MAGNESIUM: Magnesium: 2 mg/dL (ref 1.7–2.4)

## 2020-02-16 MED ORDER — SODIUM CHLORIDE 0.9 % IV SOLN: 250.0000 mL | INTRAVENOUS | Status: DC | PRN

## 2020-02-16 MED ORDER — APIXABAN 5 MG PO TABS
5.0000 mg | ORAL_TABLET | Freq: Two times a day (BID) | ORAL | Status: DC
  Administered 2020-02-16 – 2020-02-20 (×8): 5 mg via ORAL
  Filled 2020-02-16 (×8): qty 1

## 2020-02-16 MED ORDER — DOFETILIDE 250 MCG PO CAPS
250.0000 ug | ORAL_CAPSULE | Freq: Two times a day (BID) | ORAL | Status: DC
  Administered 2020-02-16 – 2020-02-18 (×5): 250 ug via ORAL
  Filled 2020-02-16 (×5): qty 1

## 2020-02-16 MED ORDER — FERROUS SULFATE 325 (65 FE) MG PO TABS
325.0000 mg | ORAL_TABLET | Freq: Two times a day (BID) | ORAL | Status: DC
  Administered 2020-02-16 – 2020-02-20 (×8): 325 mg via ORAL
  Filled 2020-02-16 (×7): qty 1

## 2020-02-16 MED ORDER — TORSEMIDE 10 MG PO TABS
10.0000 mg | ORAL_TABLET | Freq: Every day | ORAL | Status: DC
  Administered 2020-02-17 – 2020-02-20 (×4): 10 mg via ORAL
  Filled 2020-02-16 (×4): qty 1

## 2020-02-16 MED ORDER — DIGOXIN 125 MCG PO TABS
0.1250 mg | ORAL_TABLET | Freq: Every day | ORAL | Status: DC
  Administered 2020-02-17 – 2020-02-20 (×4): 0.125 mg via ORAL
  Filled 2020-02-16 (×4): qty 1

## 2020-02-16 MED ORDER — MAGNESIUM SULFATE 2 GM/50ML IV SOLN
2.0000 g | Freq: Once | INTRAVENOUS | Status: AC
  Administered 2020-02-16: 2 g via INTRAVENOUS
  Filled 2020-02-16: qty 50

## 2020-02-16 MED ORDER — ATORVASTATIN CALCIUM 10 MG PO TABS
10.0000 mg | ORAL_TABLET | Freq: Every day | ORAL | Status: DC
  Administered 2020-02-17 – 2020-02-20 (×4): 10 mg via ORAL
  Filled 2020-02-16 (×4): qty 1

## 2020-02-16 MED ORDER — PANTOPRAZOLE SODIUM 40 MG PO TBEC
40.0000 mg | DELAYED_RELEASE_TABLET | Freq: Every day | ORAL | Status: DC
  Administered 2020-02-16 – 2020-02-20 (×5): 40 mg via ORAL
  Filled 2020-02-16 (×4): qty 1

## 2020-02-16 MED ORDER — POTASSIUM CHLORIDE CRYS ER 10 MEQ PO TBCR
10.0000 meq | EXTENDED_RELEASE_TABLET | Freq: Every day | ORAL | Status: DC
  Administered 2020-02-17 – 2020-02-20 (×4): 10 meq via ORAL
  Filled 2020-02-16 (×5): qty 1

## 2020-02-16 MED ORDER — CARVEDILOL 6.25 MG PO TABS: 6.2500 mg | ORAL_TABLET | Freq: Two times a day (BID) | ORAL | Status: DC

## 2020-02-16 MED ORDER — PANTOPRAZOLE SODIUM 40 MG PO TBEC
40.0000 mg | DELAYED_RELEASE_TABLET | Freq: Every day | ORAL | Status: DC
  Filled 2020-02-16: qty 1

## 2020-02-16 MED ORDER — CARVEDILOL 6.25 MG PO TABS
6.2500 mg | ORAL_TABLET | Freq: Two times a day (BID) | ORAL | Status: DC
  Administered 2020-02-16 – 2020-02-20 (×8): 6.25 mg via ORAL
  Filled 2020-02-16 (×7): qty 1

## 2020-02-16 MED ORDER — SODIUM CHLORIDE 0.9% FLUSH
3.0000 mL | Freq: Two times a day (BID) | INTRAVENOUS | Status: DC
  Administered 2020-02-16 – 2020-02-20 (×9): 3 mL via INTRAVENOUS

## 2020-02-16 MED ORDER — SODIUM CHLORIDE 0.9% FLUSH: 3.0000 mL | INTRAVENOUS | Status: DC | PRN

## 2020-02-16 NOTE — H&P (Signed)
Primary Care Physician: [name], MD Primary Cardiologist: Dr Oval Linsey Primary Electrophysiologist: none Referring Physician: Dr Debbe Odea is a 74 y.o. male with a history of persistent atrial fibrillation, chronic systolic and diastolic heart failure, DM, CKD 3, RCC s/p nephrectomy, and obesity who presents for follow up in the [facility].  He was admitted 11/2019 with GI bleeding and atrial fibrillation in the setting of recent COVID pneumonia.  He had COVID pneumonia approximately 1 month prior to admission.  He subsequently developed palpitations and cough.  His heart rate was in the 160s and he was found to be in atrial fibrillation.  Thyroid function was normal.  He was started on anticoagulation and developed bleeding gastric ulcers that required clipping.  He was found to be positive for H. Pylori.  He was subsequently started on Eliquis for a CHADS2VASC score of 3.  Echo that admission revealed LVEF 25 to 30% with global hypokinesis.  Hypokinesis was worse than the anterior myocardium. He underwent successful DCCV on 12/17/19. Unfortunately, he had ERAF after DCCV. Repeat echo showed EF 30-35%. Patient is unaware of his arrhythmia.  On follow up today, patient presents for dofetilide admission. Patient reports that he has done well since his last visit. He remains in rate controlled afib today. He denies any missed doses of anticoagulation in the last 3 weeks.   Today, he denies symptoms of palpitations, chest pain, shortness of breath, orthopnea, PND, lower extremity edema, dizziness, presyncope, syncope, snoring, daytime somnolence, bleeding, or neurologic sequela. The patient is tolerating medications without difficulties and is otherwise without complaint today.    Atrial Fibrillation Risk Factors:  he does not have symptoms or diagnosis of sleep apnea. he does not have a history of rheumatic fever.   he has a BMI of Body mass index  is 31.66 kg/m.Marland Kitchen Filed Weights   02/16/20 1506  Weight: 108.9 kg    Family History  Problem Relation Age of Onset  . Cancer Mother   . Heart attack Father   . Heart attack Brother   . Heart disease Sister   . Stroke Paternal Grandmother      Atrial Fibrillation Management history:  Previous antiarrhythmic drugs: none Previous cardioversions: 12/17/19 Previous ablations: none CHADS2VASC score: 3 Anticoagulation history: Eliquis    Past Medical History:  Diagnosis Date  . Chronic combined systolic and diastolic heart failure ([place]) 12/13/2019  . Chronic kidney disease   . CKD (chronic kidney disease) stage 3, GFR 30-59 ml/min 12/13/2019  . Diabetes mellitus ([place])   . Gastric ulcer 12/13/2019   +H. Pylori. Bleeding required clipping.  . H/O blood clots   . History of kidney cancer   . Persistent atrial fibrillation ([place]) 12/13/2019  . Renal cell carcinoma ([place]) 12/13/2019   S/p nephrectomy   Past Surgical History:  Procedure Laterality Date  . APPENDECTOMY    . BIOPSY  11/19/2019   Procedure: BIOPSY;  Surgeon: Ronald Lobo, MD;  Location: [place];  Service: Endoscopy;;  . CARDIOVERSION N/A 12/17/2019   Procedure: CARDIOVERSION;  Surgeon: Geralynn Rile, MD;  Location: [place];  Service: Cardiovascular;  Laterality: N/A;  . ESOPHAGOGASTRODUODENOSCOPY N/A 11/19/2019   Procedure: ESOPHAGOGASTRODUODENOSCOPY (EGD);  Surgeon: Ronald Lobo, MD;  Location: [facility] ENDOSCOPY;  Service: Endoscopy;  Laterality: N/A;  . HEMOSTASIS CLIP PLACEMENT  11/19/2019   Procedure: HEMOSTASIS CLIP PLACEMENT;  Surgeon: Ronald Lobo, MD;  Location: [place];  Service: Endoscopy;;  . NEPHRECTOMY  Current Facility-Administered Medications  Medication Dose Route Frequency Provider Last Rate Last Admin  . 0.9 %  sodium chloride infusion  250 mL Intravenous PRN Fenton, Clint R, PA      . apixaban (ELIQUIS) tablet 5 mg  5 mg Oral BID Dacota Ruben, Jeneen Rinks, MD   5 mg at 02/16/20 2018  .  [START ON 02/17/2020] atorvastatin (LIPITOR) tablet 10 mg  10 mg Oral Daily Eliel Dudding, Berish, MD      . carvedilol (COREG) tablet 6.25 mg  6.25 mg Oral BID WC Artist Bloom, Jeneen Rinks, MD   6.25 mg at 02/16/20 1740  . [START ON 02/17/2020] digoxin (LANOXIN) tablet 0.125 mg  0.125 mg Oral Daily Marylen Zuk, Walt, MD      . dofetilide [facility]) capsule 250 mcg  250 mcg Oral BID Aarti Mankowski, Jeneen Rinks, MD   250 mcg at 02/16/20 2016  . ferrous sulfate tablet 325 mg  325 mg Oral BID WC Shoshannah Faubert, Jeneen Rinks, MD   325 mg at 02/16/20 1741  . pantoprazole (PROTONIX) EC tablet 40 mg  40 mg Oral Daily Winnie Barsky, Jeneen Rinks, MD   40 mg at 02/16/20 1741  . [START ON 02/17/2020] potassium chloride (KLOR-CON) CR tablet 10 mEq  10 mEq Oral Daily Lorice Lafave, Browning, MD      . sodium chloride flush (NS) 0.9 % injection 3 mL  3 mL Intravenous Q12H Fenton, Clint R, PA   3 mL at 02/16/20 2018  . sodium chloride flush (NS) 0.9 % injection 3 mL  3 mL Intravenous PRN Fenton, Clint R, PA      . [START ON 02/17/2020] torsemide (DEMADEX) tablet 10 mg  10 mg Oral Daily Jleigh Striplin, Jeneen Rinks, MD        Allergies  Allergen Reactions  . Novocain [Procaine] Other (See Comments)    Syncope (passed out)     Social History   Socioeconomic History  . Marital status: Married    Spouse name: Not on file  . Number of children: Not on file  . Years of education: Not on file  . Highest education level: Not on file  Occupational History  . Occupation: truck Geophysicist/field seismologist  Tobacco Use  . Smoking status: Former Smoker    Packs/day: 2.00    Years: 35.00    Pack years: 70.00    Types: Cigarettes    Quit date: 2000    Years since quitting: 21.3  . Smokeless tobacco: Never Used  Substance and Sexual Activity  . Alcohol use: Yes    Alcohol/week: 1.0 - 2.0 standard drinks    Types: 1 - 2 Cans of beer per week    Comment: Socially  . Drug use: No  . Sexual activity: Not on file  Other Topics Concern  . Not on file  Social History Narrative   Lives with wife in a one story home.  Has  3 children.     Works as a Administrator.     Education: 12th grade.   Social Determinants of Health   Financial Resource Strain:   . Difficulty of Paying Living Expenses:   Food Insecurity:   . Worried About Charity fundraiser in the Last Year:   . Arboriculturist in the Last Year:   Transportation Needs:   . Film/video editor (Medical):   Marland Kitchen Lack of Transportation (Non-Medical):   Physical Activity:   . Days of Exercise per Week:   . Minutes of Exercise per Session:   Stress:   . Feeling of Stress :  Social Connections:   . Frequency of Communication with Friends and Family:   . Frequency of Social Gatherings with Friends and Family:   . Attends Religious Services:   . Active Member of Clubs or Organizations:   . Attends Archivist Meetings:   Marland Kitchen Marital Status:   Intimate Partner Violence:   . Fear of Current or Ex-Partner:   . Emotionally Abused:   Marland Kitchen Physically Abused:   . Sexually Abused:      ROS- All systems are reviewed and negative except as per the HPI above.  Physical Exam: Vitals:   02/16/20 1459 02/16/20 1506  BP: 107/84   Pulse: 86   Resp: 17   Temp: 97.7 F (36.5 C)   TempSrc: Oral   SpO2: 99%   Weight:  108.9 kg  Height:  6\' 1"  (1.854 m)    GEN- The patient is well appearing, alert and oriented x 3 today.   HEENT-head normocephalic, atraumatic, sclera clear, conjunctiva pink, hearing intact, trachea midline. Lungs- Clear to ausculation bilaterally, normal work of breathing Heart- irregular rate and rhythm, no murmurs, rubs or gallops  GI- soft, NT, ND, + BS Extremities- no clubbing, cyanosis, or edema MS- no significant deformity or atrophy Skin- no rash or lesion Psych- euthymic mood, full affect Neuro- strength and sensation are intact   Wt Readings from Last 3 Encounters:  02/16/20 108.9 kg  02/16/20 105.7 kg  01/18/20 111.9 kg    EKG today demonstrates afib HR 72, LAFB, PVCs, QRS 102, QTc 479  Echo 01/05/20  demonstrated  1. Left ventricular ejection fraction, by estimation, is 30 to 35%. The  left ventricle has moderately decreased function. The left ventricle  demonstrates global hypokinesis. Left ventricular diastolic function could  not be evaluated.  2. Right ventricular systolic function is normal. The right ventricular  size is mildly enlarged. There is mildly elevated pulmonary artery  systolic pressure.  3. Left atrial size was moderately dilated.  4. Right atrial size was mildly dilated.  5. The mitral valve is normal in structure. Mild mitral valve  regurgitation. No evidence of mitral stenosis.  6. The aortic valve is tricuspid. Aortic valve regurgitation is trivial.  No aortic stenosis is present.  7. Aortic dilatation noted. There is mild dilatation of the ascending  aorta measuring 39 mm.  8. The inferior vena cava is normal in size with greater than 50%  respiratory variability, suggesting right atrial pressure of 3 mmHg.   LA 5.5 cm  Epic records are reviewed at length today  CHA2DS2-VASc Score = 3 The patient's score is based upon: CHF History: Yes HTN History: No Age : 42-74 Diabetes History: Yes Stroke History: No Vascular Disease History: No Gender: Male   ASSESSMENT AND PLAN: 1. Persistent Atrial Fibrillation (ICD10:  I48.19) The patient's CHA2DS2-VASc score is 3, indicating a 3.2% annual risk of stroke.   S/p DCCV 12/17/19 with ERAF Patient wants to pursue dofetilide, aware of risk vrs benefit Aware of price of dofetilide No benadryl use PharmD has screened drugs and no QT prolonging drugs on board QTc in SR 435 ms, Labs today show creatinine at 2.04, K+ 4.9 and mag 2.0, CrCl calculated at 49 mL/min Continue Coreg 6.25 mg BID Continue digoxin 0.125 mg daily Continue Eliquis 5 mg BID. Patient denies any missed doses in the last 3 weeks.  2. Secondary Hypercoagulable State (ICD10:  D68.69) The patient is at significant risk for  stroke/thromboembolism based upon his CHA2DS2-VASc Score of 3.  Continue Apixaban (Eliquis).   3. Chronic systolic CHF EF mildly improved to 30-35%. No signs or symptoms of fluid overload. Patient scheduled to establish care with [facility]. Per Dr Aundra Dubin, he will see sometime during this admission.    To be admitted later today when a bed becomes available.    [facility] [address] [place], [place] [address] [phone] 02/16/2020 10:49 PM    I have seen, examined the patient, and reviewed the above assessment and plan.  Changes to above are made where necessary.  On exam, iRRR.  Reports compliance with anticoagulation without interruption.  We will admit for initiation of tikosyn.  Co Sign: [name], MD 02/16/2020 10:50 PM

## 2020-02-16 NOTE — Progress Notes (Signed)
Primary Care Physician: [name], MD Primary Cardiologist: Dr Oval Linsey Primary Electrophysiologist: none Referring Physician: Dr Debbe Odea is a 74 y.o. male with a history of persistent atrial fibrillation, chronic systolic and diastolic heart failure, DM, CKD 3, RCC s/p nephrectomy, and obesity who presents for follow up in the [facility].  He was admitted 11/2019 with GI bleeding and atrial fibrillation in the setting of recent COVID pneumonia.  He had COVID pneumonia approximately 1 month prior to admission.  He subsequently developed palpitations and cough.  His heart rate was in the 160s and he was found to be in atrial fibrillation.  Thyroid function was normal.  He was started on anticoagulation and developed bleeding gastric ulcers that required clipping.  He was found to be positive for H. Pylori.  He was subsequently started on Eliquis for a CHADS2VASC score of 3.  Echo that admission revealed LVEF 25 to 30% with global hypokinesis.  Hypokinesis was worse than the anterior myocardium. He underwent successful DCCV on 12/17/19. Unfortunately, he had ERAF after DCCV. Repeat echo showed EF 30-35%. Patient is unaware of his arrhythmia.  On follow up today, patient presents for dofetilide admission. Patient reports that he has done well since his last visit. He remains in rate controlled afib today. He denies any missed doses of anticoagulation in the last 3 weeks.   Today, he denies symptoms of palpitations, chest pain, shortness of breath, orthopnea, PND, lower extremity edema, dizziness, presyncope, syncope, snoring, daytime somnolence, bleeding, or neurologic sequela. The patient is tolerating medications without difficulties and is otherwise without complaint today.    Atrial Fibrillation Risk Factors:  he does not have symptoms or diagnosis of sleep apnea. he does not have a history of rheumatic fever.   he has a BMI of Body mass index  is 29.92 kg/m.Marland Kitchen Filed Weights   02/16/20 1103  Weight: 105.7 kg    Family History  Problem Relation Age of Onset  . Cancer Mother   . Heart attack Father   . Heart attack Brother   . Heart disease Sister   . Stroke Paternal Grandmother      Atrial Fibrillation Management history:  Previous antiarrhythmic drugs: none Previous cardioversions: 12/17/19 Previous ablations: none CHADS2VASC score: 3 Anticoagulation history: Eliquis    Past Medical History:  Diagnosis Date  . Chronic combined systolic and diastolic heart failure ([place]) 12/13/2019  . Chronic kidney disease   . CKD (chronic kidney disease) stage 3, GFR 30-59 ml/min 12/13/2019  . Diabetes mellitus ([place])   . Gastric ulcer 12/13/2019   +H. Pylori. Bleeding required clipping.  . H/O blood clots   . History of kidney cancer   . Persistent atrial fibrillation ([place]) 12/13/2019  . Renal cell carcinoma ([place]) 12/13/2019   S/p nephrectomy   Past Surgical History:  Procedure Laterality Date  . APPENDECTOMY    . BIOPSY  11/19/2019   Procedure: BIOPSY;  Surgeon: Ronald Lobo, MD;  Location: [place];  Service: Endoscopy;;  . CARDIOVERSION N/A 12/17/2019   Procedure: CARDIOVERSION;  Surgeon: Geralynn Rile, MD;  Location: [facility];  Service: Cardiovascular;  Laterality: N/A;  . ESOPHAGOGASTRODUODENOSCOPY N/A 11/19/2019   Procedure: ESOPHAGOGASTRODUODENOSCOPY (EGD);  Surgeon: Ronald Lobo, MD;  Location: [facility] ENDOSCOPY;  Service: Endoscopy;  Laterality: N/A;  . HEMOSTASIS CLIP PLACEMENT  11/19/2019   Procedure: HEMOSTASIS CLIP PLACEMENT;  Surgeon: Ronald Lobo, MD;  Location: [place];  Service: Endoscopy;;  . NEPHRECTOMY  Current Outpatient Medications  Medication Sig Dispense Refill  . Accu-Chek Softclix Lancets lancets     . apixaban (ELIQUIS) 5 MG TABS tablet Take 1 tablet (5 mg total) by mouth 2 (two) times daily. 60 tablet 2  . ascorbic acid (V-R VITAMIN C) 500 MG tablet Take 500 mg by mouth every  evening.    Marland Kitchen atorvastatin (LIPITOR) 10 MG tablet Take 10 mg by mouth daily.    . carvedilol (COREG) 6.25 MG tablet Take 1 tablet (6.25 mg total) by mouth 2 (two) times daily with a meal. 60 tablet 6  . digoxin (LANOXIN) 0.125 MG tablet Take 0.125 mg by mouth daily.     . ferrous sulfate 325 (65 FE) MG tablet Take 1 tablet (325 mg total) by mouth 2 (two) times daily with a meal.    . Garlic 4496 MG CAPS Take 1,000 mg by mouth every evening.    . Glucosamine HCl 1000 MG TABS Take 1,000 mg by mouth every evening.    . Multiple Vitamin (MULTIVITAMIN WITH MINERALS) TABS tablet Take 1 tablet by mouth every evening.    . Omega-3 Fatty Acids (FISH OIL) 1000 MG CAPS Take 1,000 mg by mouth every evening.     . pantoprazole (PROTONIX) 40 MG tablet Take 1 tablet (40 mg total) by mouth 2 (two) times daily. (Patient taking differently: Take 40 mg by mouth daily. ) 60 tablet 3  . potassium chloride (KLOR-CON) 10 MEQ tablet Take 1 tablet (10 mEq total) by mouth daily. 30 tablet 3  . Saw Palmetto 450 MG CAPS Take 450 mg by mouth every evening.    . torsemide (DEMADEX) 10 MG tablet Take 10 mg by mouth daily.     No current facility-administered medications for this encounter.    Allergies  Allergen Reactions  . Novocain [Procaine] Other (See Comments)    Syncope (passed out)     Social History   Socioeconomic History  . Marital status: Married    Spouse name: Not on file  . Number of children: Not on file  . Years of education: Not on file  . Highest education level: Not on file  Occupational History  . Occupation: truck Geophysicist/field seismologist  Tobacco Use  . Smoking status: Former Smoker    Packs/day: 2.00    Years: 35.00    Pack years: 70.00    Types: Cigarettes    Quit date: 2000    Years since quitting: 21.3  . Smokeless tobacco: Never Used  Substance and Sexual Activity  . Alcohol use: Yes    Alcohol/week: 1.0 - 2.0 standard drinks    Types: 1 - 2 Cans of beer per week    Comment: Socially  .  Drug use: No  . Sexual activity: Not on file  Other Topics Concern  . Not on file  Social History Narrative   Lives with wife in a one story home.  Has 3 children.     Works as a Administrator.     Education: 12th grade.   Social Determinants of Health   Financial Resource Strain:   . Difficulty of Paying Living Expenses:   Food Insecurity:   . Worried About Charity fundraiser in the Last Year:   . Arboriculturist in the Last Year:   Transportation Needs:   . Film/video editor (Medical):   Marland Kitchen Lack of Transportation (Non-Medical):   Physical Activity:   . Days of Exercise per Week:   . Minutes  of Exercise per Session:   Stress:   . Feeling of Stress :   Social Connections:   . Frequency of Communication with Friends and Family:   . Frequency of Social Gatherings with Friends and Family:   . Attends Religious Services:   . Active Member of Clubs or Organizations:   . Attends Archivist Meetings:   Marland Kitchen Marital Status:   Intimate Partner Violence:   . Fear of Current or Ex-Partner:   . Emotionally Abused:   Marland Kitchen Physically Abused:   . Sexually Abused:      ROS- All systems are reviewed and negative except as per the HPI above.  Physical Exam: Vitals:   02/16/20 1103  BP: 102/62  Pulse: 72  Weight: 105.7 kg  Height: 6\' 2"  (1.88 m)    GEN- The patient is well appearing, alert and oriented x 3 today.   HEENT-head normocephalic, atraumatic, sclera clear, conjunctiva pink, hearing intact, trachea midline. Lungs- Clear to ausculation bilaterally, normal work of breathing Heart- irregular rate and rhythm, no murmurs, rubs or gallops  GI- soft, NT, ND, + BS Extremities- no clubbing, cyanosis, or edema MS- no significant deformity or atrophy Skin- no rash or lesion Psych- euthymic mood, full affect Neuro- strength and sensation are intact   Wt Readings from Last 3 Encounters:  02/16/20 105.7 kg  01/18/20 111.9 kg  12/31/19 103.8 kg    EKG today  demonstrates afib HR 72, LAFB, PVCs, QRS 102, QTc 479  Echo 01/05/20 demonstrated  1. Left ventricular ejection fraction, by estimation, is 30 to 35%. The  left ventricle has moderately decreased function. The left ventricle  demonstrates global hypokinesis. Left ventricular diastolic function could  not be evaluated.  2. Right ventricular systolic function is normal. The right ventricular  size is mildly enlarged. There is mildly elevated pulmonary artery  systolic pressure.  3. Left atrial size was moderately dilated.  4. Right atrial size was mildly dilated.  5. The mitral valve is normal in structure. Mild mitral valve  regurgitation. No evidence of mitral stenosis.  6. The aortic valve is tricuspid. Aortic valve regurgitation is trivial.  No aortic stenosis is present.  7. Aortic dilatation noted. There is mild dilatation of the ascending  aorta measuring 39 mm.  8. The inferior vena cava is normal in size with greater than 50%  respiratory variability, suggesting right atrial pressure of 3 mmHg.   LA 5.5 cm  Epic records are reviewed at length today  CHA2DS2-VASc Score = 3 The patient's score is based upon: CHF History: Yes HTN History: No Age : 34-74 Diabetes History: Yes Stroke History: No Vascular Disease History: No Gender: Male   ASSESSMENT AND PLAN: 1. Persistent Atrial Fibrillation (ICD10:  I48.19) The patient's CHA2DS2-VASc score is 3, indicating a 3.2% annual risk of stroke.   S/p DCCV 12/17/19 with ERAF Patient wants to pursue dofetilide, aware of risk vrs benefit Aware of price of dofetilide No benadryl use PharmD has screened drugs and no QT prolonging drugs on board QTc in SR 435 ms, Labs today show creatinine at 2.04, K+ 4.9 and mag 2.0, CrCl calculated at 49 mL/min Continue Coreg 6.25 mg BID Continue digoxin 0.125 mg daily Continue Eliquis 5 mg BID. Patient denies any missed doses in the last 3 weeks.  2. Secondary Hypercoagulable State  (ICD10:  D68.69) The patient is at significant risk for stroke/thromboembolism based upon his CHA2DS2-VASc Score of 3.  Continue Apixaban (Eliquis).   3. Chronic systolic CHF  EF mildly improved to 30-35%. No signs or symptoms of fluid overload. Patient scheduled to establish care with [facility]. Per Dr Aundra Dubin, he will see sometime during this admission.    To be admitted later today when a bed becomes available.    [facility] [address] [place], [place] [address] [phone] 02/16/2020 11:45 AM

## 2020-02-16 NOTE — Progress Notes (Signed)
Pharmacy: Dofetilide (Tikosyn) - Initial Consult Assessment and Electrolyte Replacement  Pharmacy consulted to assist in monitoring and replacing electrolytes in this 74 y.o. male admitted on 02/16/2020 undergoing dofetilide initiation. First dofetilide dose: 5/11 at 2000  Assessment:  Patient Exclusion Criteria: If any screening criteria checked as "Yes", then  patient  should NOT receive dofetilide until criteria item is corrected.  If "Yes" please indicate correction plan.  YES  NO Patient  Exclusion Criteria Correction Plan   [x]   []   Baseline QTc interval is greater than or equal to 440 msec. IF above YES box checked dofetilide contraindicated unless patient has ICD; then may proceed if QTc 500-550 msec or with known ventricular conduction abnormalities may proceed with QTc 550-600 msec. QTc =  479ms patient is in Afib so actual QTc should be lower.  (EP aware of QTc, when in SR was 481ms per documentation.)   []   [x]   Patient is known or suspected to have a digoxin level greater than 2 ng/ml: No results found for: DIGOXIN     []   [x]   Creatinine clearance less than 20 ml/min (calculated using Cockcroft-Gault, actual body weight and serum creatinine): Estimated Creatinine Clearance: 42.4 mL/min (A) (by C-G formula based on SCr of 2.04 mg/dL (H)).     []   [x]  Patient has received drugs known to prolong the QT intervals within the last 48 hours (phenothiazines, tricyclics or tetracyclic antidepressants, erythromycin, H-1 antihistamines, cisapride, fluoroquinolones, azithromycin). Drugs not listed above may have an, as yet, undetected potential to prolong the QT interval, updated information on QT prolonging agents is available at this website:QT prolonging agents or www.crediblemeds.org    []   [x]   Patient received a dose of hydrochlorothiazide (Oretic) alone or in any combination including triamterene (Dyazide, Maxzide) in the last 48 hours.    []   [x]  Patient received a  medication known to increase dofetilide plasma concentrations prior to initial dofetilide dose:  . Trimethoprim (Primsol, Proloprim) in the last 36 hours . Verapamil (Calan, Verelan) in the last 36 hours or a sustained release dose in the last 72 hours . Megestrol (Megace) in the last 5 days  . Cimetidine (Tagamet) in the last 6 hours . Ketoconazole (Nizoral) in the last 24 hours . Itraconazole (Sporanox) in the last 48 hours  . Prochlorperazine (Compazine) in the last 36 hours     []   [x]   Patient is known to have a history of torsades de pointes; congenital or acquired long QT syndromes.    []   [x]   Patient has received a Class 1 antiarrhythmic with less than 2 half-lives since last dose. (Disopyramide, Quinidine, Procainamide, Lidocaine, Mexiletine, Flecainide, Propafenone)    []   [x]   Patient has received amiodarone therapy in the past 3 months or amiodarone level is greater than 0.3 ng/ml.    Patient has been appropriately anticoagulated with Eliquis.  Labs:    Component Value Date/Time   K 4.9 02/16/2020 1125   MG 2.0 02/16/2020 1125     Plan: Potassium: K >/= 4: Appropriate to initiate Tikosyn, no replacement needed    Magnesium: Mg 1.8-2: Give Mg 2 gm IV x1 to prevent Mg from dropping below 1.8 - do not need to recheck Mg. Appropriate to initiate Tikosyn    Manisha Cancel D. Mina Marble, PharmD, BCPS, [place] 02/16/2020, 3:33 PM

## 2020-02-17 LAB — MAGNESIUM: Magnesium: 2.3 mg/dL (ref 1.7–2.4)

## 2020-02-17 LAB — BASIC METABOLIC PANEL
Anion gap: 11 (ref 5–15)
BUN: 23 mg/dL (ref 8–23)
CO2: 22 mmol/L (ref 22–32)
Calcium: 8.9 mg/dL (ref 8.9–10.3)
Chloride: 105 mmol/L (ref 98–111)
Creatinine, Ser: 1.74 mg/dL — ABNORMAL HIGH (ref 0.61–1.24)
GFR calc Af Amer: 44 mL/min — ABNORMAL LOW (ref >60)
GFR calc non Af Amer: 38 mL/min — ABNORMAL LOW (ref >60)
Glucose, Bld: 125 mg/dL — ABNORMAL HIGH (ref 70–99)
Potassium: 4.2 mmol/L (ref 3.5–5.1)
Sodium: 138 mmol/L (ref 135–145)

## 2020-02-17 MED ORDER — ACETAMINOPHEN 325 MG PO TABS
650.0000 mg | ORAL_TABLET | Freq: Four times a day (QID) | ORAL | Status: DC | PRN
  Administered 2020-02-17 (×2): 650 mg via ORAL
  Filled 2020-02-17 (×2): qty 2

## 2020-02-17 NOTE — Progress Notes (Signed)
Pharmacy: Dofetilide (Tikosyn) - Follow Up Assessment and Electrolyte Replacement  Pharmacy consulted to assist in monitoring and replacing electrolytes in this 74 y.o. male admitted on 02/16/2020 undergoing dofetilide initiation.   Labs:    Component Value Date/Time   K 4.2 02/17/2020 0550   MG 2.3 02/17/2020 0550     Plan: Potassium: K >/= 4: No additional supplementation needed  Magnesium: Mg > 2: No additional supplementation needed   As patient has required on average 10 mEq of potassium replacement every day, recommend discharging patient with prescription for:  Potassium chloride 10 mEq  daily  Thank you for allowing pharmacy to participate in this patient's care   Hildred Laser, PharmD Clinical Pharmacist **Pharmacist phone directory can now be found on [email or web address] ([email or web address]).  Listed under [place].

## 2020-02-17 NOTE — TOC Benefit Eligibility Note (Signed)
Transition of Care [facility]) Benefit Eligibility Note    Patient Details  Name: LOWRY BALA MRN: [id number] Date of Birth: [birth year]   Medication/Dose: DOFETILIDE   125 MCG BID CO-PAY- $90.00     250 MCG BID CO-PAY- $90.00    500 MCG BID  CO-PY- $90.00  Covered?: Yes  Tier: (RIER- 4 DRUG)  Prescription Coverage [facility]erred Pharmacy: [facility]  Spoke with Person/Company/Phone Number:: APRIL    @ [place] # [phone]  Co-Pay: $90.00        Additional Notes: TIKOSYN : NON-FORMULARY  P/A -YES # [phone]    Memory Argue Phone Number: 02/17/2020, 9:10 AM

## 2020-02-17 NOTE — Progress Notes (Signed)
Morning EKG reviewed  Shows pt remains in afib at 78 bpm with stable QTc at ~440 ms.  Continue Tikosyn 250 mcg BID. (Renally adjusted)  Pt will be NPO after midnight for DCCV if remains in [place], [place]  Pager: [phone]  02/17/2020 1:25 PM

## 2020-02-17 NOTE — H&P (View-Only) (Signed)
Morning EKG reviewed  Shows pt remains in afib at 78 bpm with stable QTc at ~440 ms.  Continue Tikosyn 250 mcg BID. (Renally adjusted)  Pt will be NPO after midnight for DCCV if remains in [place], [place]  Pager: [phone]  02/17/2020 1:25 PM

## 2020-02-17 NOTE — Progress Notes (Addendum)
Electrophysiology Rounding Note  Patient Name: Melvin Ramirez Date of Encounter: 02/17/2020  Primary Cardiologist: No primary care provider on file.  Electrophysiologist: Dr. Rayann Heman   Subjective   Pt remains in afib on Tikosyn 250 mcg BID   QTc from EKG last pm shows stable QTc at ~460 ms  The patient is doing well today.  At this time, the patient denies chest pain, shortness of breath, or any new concerns.  Inpatient Medications    Scheduled Meds: . apixaban  5 mg Oral BID  . atorvastatin  10 mg Oral Daily  . carvedilol  6.25 mg Oral BID WC  . digoxin  0.125 mg Oral Daily  . dofetilide  250 mcg Oral BID  . ferrous sulfate  325 mg Oral BID WC  . pantoprazole  40 mg Oral Daily  . potassium chloride  10 mEq Oral Daily  . sodium chloride flush  3 mL Intravenous Q12H  . torsemide  10 mg Oral Daily   Continuous Infusions: . sodium chloride     PRN Meds: sodium chloride, sodium chloride flush   Vital Signs    Vitals:   02/16/20 1506 02/16/20 2256 02/17/20 0010 02/17/20 0424  BP:  122/68 107/73 113/62  Pulse:  83 85 (!) 51  Resp:  18 15 16   Temp:  98.6 F (37 C) 98.7 F (37.1 C) 98.9 F (37.2 C)  TempSrc:  Oral Oral Oral  SpO2:  95% 96% 95%  Weight: 108.9 kg   105.3 kg  Height: 6\' 1"  (1.854 m)       Intake/Output Summary (Last 24 hours) at 02/17/2020 0729 Last data filed at 02/17/2020 0700 Gross per 24 hour  Intake 243 ml  Output 725 ml  Net -482 ml   Filed Weights   02/16/20 1506 02/17/20 0424  Weight: 108.9 kg 105.3 kg    Physical Exam    GEN- The patient is well appearing, alert and oriented x 3 today.   Head- normocephalic, atraumatic Eyes-  Sclera clear, conjunctiva pink Ears- hearing intact Oropharynx- clear Neck- supple Lungs- Clear to ausculation bilaterally, normal work of breathing Heart- Irregular rate and rhythm, no murmurs, rubs or gallops GI- soft, NT, ND, + BS Extremities- no clubbing, cyanosis, or edema Skin- no rash or  lesion Psych- flat but appropriate affect Neuro- strength and sensation are intact  Labs    CBC No results for input(s): WBC, NEUTROABS, HGB, HCT, MCV, PLT in the last 72 hours. Basic Metabolic Panel Recent Labs    02/16/20 1125  NA 137  K 4.9  CL 104  CO2 24  GLUCOSE 108*  BUN 27*  CREATININE 2.04*  CALCIUM 9.1  MG 2.0    Potassium  Date/Time Value Ref Range Status  02/16/2020 11:25 AM 4.9 3.5 - 5.1 mmol/L Final   Magnesium  Date/Time Value Ref Range Status  02/16/2020 11:25 AM 2.0 1.7 - 2.4 mg/dL Final    Comment:    Performed at [facility], [place] [address] [place], [place] [address]    Telemetry    AF with rates in 80-90s (personally reviewed)  Radiology    No results found.   Patient Profile     Melvin Ramirez is a 74 y.o. male with a past medical history significant for persistent atrial fibrillation.  They were admitted for tikosyn load.   Assessment & Plan    1. Persistent atrial fibrillation Pt remains in afib on Tikosyn 250 mcg BID  Continue Eliquis  Electrolytes pending this am CHA2DS2VASC is at least 3   2. AKI on CKD III Cr appears 1.4-1.6 baseline when in sinus.  Tikosyn dose reduced on initiation. Follow.   If pt does not convert chemically, plan on DCCV tomorrow   For questions or updates, please contact [facility] Please consult [email or web address] for contact info under Cardiology/STEMI.  Signed, Shirley Friar, PA-C  02/17/2020, 7:29 AM   I have seen, examined the patient, and reviewed the above assessment and plan.  Changes to above are made where necessary.  On exam, iRRR.  Remains in afib.  Qt is stable.  Continue tikosyn load.  Co Sign: [name], MD

## 2020-02-18 ENCOUNTER — Encounter (HOSPITAL_COMMUNITY): Payer: Self-pay | Admitting: Internal Medicine

## 2020-02-18 ENCOUNTER — Encounter (HOSPITAL_COMMUNITY)
Admission: AD | Disposition: A | Discharge status: Home / Self Care | Payer: Self-pay | Source: Home / Self Care | Attending: Internal Medicine

## 2020-02-18 ENCOUNTER — Inpatient Hospital Stay (HOSPITAL_COMMUNITY): Payer: Commercial Managed Care - PPO | Admitting: Certified Registered Nurse Anesthetist

## 2020-02-18 DIAGNOSIS — E782 Mixed hyperlipidemia: Secondary | ICD-10-CM

## 2020-02-18 HISTORY — PX: CARDIOVERSION: SHX1299

## 2020-02-18 LAB — BASIC METABOLIC PANEL
Anion gap: 11 (ref 5–15)
BUN: 21 mg/dL (ref 8–23)
CO2: 25 mmol/L (ref 22–32)
Calcium: 9.5 mg/dL (ref 8.9–10.3)
Chloride: 102 mmol/L (ref 98–111)
Creatinine, Ser: 1.75 mg/dL — ABNORMAL HIGH (ref 0.61–1.24)
GFR calc Af Amer: 44 mL/min — ABNORMAL LOW (ref >60)
GFR calc non Af Amer: 38 mL/min — ABNORMAL LOW (ref >60)
Glucose, Bld: 131 mg/dL — ABNORMAL HIGH (ref 70–99)
Potassium: 4.3 mmol/L (ref 3.5–5.1)
Sodium: 138 mmol/L (ref 135–145)

## 2020-02-18 LAB — MAGNESIUM: Magnesium: 2 mg/dL (ref 1.7–2.4)

## 2020-02-18 SURGERY — CARDIOVERSION
Anesthesia: General

## 2020-02-18 MED ORDER — MAGNESIUM SULFATE 2 GM/50ML IV SOLN
2.0000 g | Freq: Once | INTRAVENOUS | Status: AC
  Administered 2020-02-18: 2 g via INTRAVENOUS
  Filled 2020-02-18: qty 50

## 2020-02-18 MED ORDER — PHENYLEPHRINE 40 MCG/ML (10ML) SYRINGE FOR IV PUSH (FOR BLOOD PRESSURE SUPPORT)
PREFILLED_SYRINGE | INTRAVENOUS | Status: DC | PRN
  Administered 2020-02-18: 80 ug via INTRAVENOUS

## 2020-02-18 MED ORDER — LIDOCAINE 2% (20 MG/ML) 5 ML SYRINGE
INTRAMUSCULAR | Status: DC | PRN
  Administered 2020-02-18: 20 mg via INTRAVENOUS

## 2020-02-18 MED ORDER — PROPOFOL 10 MG/ML IV BOLUS
INTRAVENOUS | Status: DC | PRN
  Administered 2020-02-18: 50 mg via INTRAVENOUS

## 2020-02-18 MED ORDER — SODIUM CHLORIDE 0.9 % IV SOLN: INTRAVENOUS | Status: DC | PRN

## 2020-02-18 NOTE — Anesthesia Procedure Notes (Signed)
Procedure Name: General with mask airway Date/Time: 02/18/2020 1:22 PM Performed by: Harden Mo, CRNA Pre-anesthesia Checklist: Patient identified, Emergency Drugs available, Suction available and Patient being monitored Patient Re-evaluated:Patient Re-evaluated prior to induction Oxygen Delivery Method: Ambu bag Preoxygenation: Pre-oxygenation with 100% oxygen Induction Type: IV induction Placement Confirmation: positive ETCO2 and breath sounds checked- equal and bilateral Dental Injury: Teeth and Oropharynx as per pre-operative assessment

## 2020-02-18 NOTE — Interval H&P Note (Signed)
History and Physical Interval Note:  02/18/2020 1:21 PM  Melvin Ramirez  has presented today for surgery, with the diagnosis of afib.  The various methods of treatment have been discussed with the patient and family. After consideration of risks, benefits and other options for treatment, the patient has consented to  Procedure(s): CARDIOVERSION (N/A) as a surgical intervention.  The patient's history has been reviewed, patient examined, no change in status, stable for surgery.  I have reviewed the patient's chart and labs.  Questions were answered to the patient's satisfaction.     [employer]

## 2020-02-18 NOTE — Progress Notes (Addendum)
   Electrophysiology Rounding Note  Patient Name: Melvin Ramirez Date of Encounter: 02/18/2020  Electrophysiologist: Karis Rilling   Subjective   The patient is doing well today.  At this time, the patient denies chest pain, shortness of breath, or any new concerns.  Inpatient Medications    Scheduled Meds: . apixaban  5 mg Oral BID  . atorvastatin  10 mg Oral Daily  . carvedilol  6.25 mg Oral BID WC  . digoxin  0.125 mg Oral Daily  . dofetilide  250 mcg Oral BID  . ferrous sulfate  325 mg Oral BID WC  . pantoprazole  40 mg Oral Daily  . potassium chloride  10 mEq Oral Daily  . sodium chloride flush  3 mL Intravenous Q12H  . torsemide  10 mg Oral Daily   Continuous Infusions: . sodium chloride     PRN Meds: sodium chloride, acetaminophen, sodium chloride flush   Vital Signs    Vitals:   02/17/20 0900 02/17/20 1813 02/17/20 2257 02/18/20 0516  BP: 119/74 127/85 109/83 120/66  Pulse:  74 73 (!) 59  Resp:  18 18 (!) 21  Temp:  98.1 F (36.7 C) 98 F (36.7 C) 98.2 F (36.8 C)  TempSrc:  Oral Oral Oral  SpO2:   95% 96%  Weight:    104.5 kg  Height:        Intake/Output Summary (Last 24 hours) at 02/18/2020 0759 Last data filed at 02/18/2020 0700 Gross per 24 hour  Intake 423 ml  Output 3000 ml  Net -2577 ml   Filed Weights   02/16/20 1506 02/17/20 0424 02/18/20 0516  Weight: 108.9 kg 105.3 kg 104.5 kg    Physical Exam    GEN- The patient is well appearing, alert and oriented x 3 today.   Head- normocephalic, atraumatic Eyes-  Sclera clear, conjunctiva pink Ears- hearing intact Oropharynx- clear Neck- supple Lungs- Clear to ausculation bilaterally, normal work of breathing Heart- Irregular rate and rhythm  GI- soft, NT, ND, + BS Extremities- no clubbing, cyanosis, or edema Skin- no rash or lesion Psych- euthymic mood, full affect Neuro- strength and sensation are intact  Labs    Basic Metabolic Panel Recent Labs    02/16/20 1125 02/17/20 0550  NA  137 138  K 4.9 4.2  CL 104 105  CO2 24 22  GLUCOSE 108* 125*  BUN 27* 23  CREATININE 2.04* 1.74*  CALCIUM 9.1 8.9  MG 2.0 2.3     Telemetry    AF, PVCs (personally reviewed)  Radiology    No results found.   Patient Profile     Melvin Ramirez is a 74 y.o. male admitted for Tikosyn load   Assessment & Plan    1.  Persistent atrial fibrillation Admitted for Tikosyn load Continue Eliquis for CHADS2VASC of 3 Labs pending this morning QTc stable NPO for DCCV later today  2.  CKD, stage III Stable No change required today   For questions or updates, please contact [facility] Please consult [email or web address] for contact info under Cardiology/STEMI.  Signed, Chanetta Marshall, NP  02/18/2020, 7:59 AM   I have seen, examined the patient, and reviewed the above assessment and plan.  Changes to above are made where necessary.  On exam, iRRR.  CHF is stable.  Continue statin for HL and coreg for elevated BP  Co Sign: [name], MD

## 2020-02-18 NOTE — CV Procedure (Signed)
    Electrical Cardioversion Procedure Note Melvin Ramirez [id number] May 28, 1946  Procedure: Electrical Cardioversion Indications:  Atrial Fibrillation  Time Out: Verified patient identification, verified procedure,medications/allergies/relevent history reviewed, required imaging and test results available.  Performed  Procedure Details  The patient was NPO after midnight. Anesthesia was administered at the beside  by Dr. Glennon Ramirez with 50mg  of propofol.  Cardioversion was performed with synchronized biphasic defibrillation via AP pads with 200 joules.  1 attempt(s) were performed.  The patient converted to normal sinus rhythm. The patient tolerated the procedure well   IMPRESSION:  Successful cardioversion of atrial fibrillation. Sinus with frequent PVC's  Melvin Ramirez 02/18/2020, 1:34 PM

## 2020-02-18 NOTE — Anesthesia Postprocedure Evaluation (Signed)
Anesthesia Post Note  Patient: Melvin Ramirez  Procedure(s) Performed: CARDIOVERSION (N/A )     Patient location during evaluation: Endoscopy Anesthesia Type: General Level of consciousness: awake and alert, oriented and patient cooperative Pain management: pain level controlled Vital Signs Assessment: post-procedure vital signs reviewed and stable Respiratory status: spontaneous breathing, nonlabored ventilation and respiratory function stable Cardiovascular status: blood pressure returned to baseline and stable Postop Assessment: no apparent nausea or vomiting Anesthetic complications: no    Last Vitals:  Vitals:   02/18/20 1340 02/18/20 1347  BP: 115/74 102/72  Pulse: 65 72  Resp: 15 13  Temp:    SpO2: 95% 96%    Last Pain:  Vitals:   02/18/20 1347  TempSrc:   PainSc: 0-No pain                 Tiras Bianchini,E. Gerlad Pelzel

## 2020-02-18 NOTE — Anesthesia Preprocedure Evaluation (Addendum)
Anesthesia Evaluation  Patient identified by MRN, date of birth, ID band Patient awake    Reviewed: Allergy & Precautions, NPO status , Patient's Chart, lab work & pertinent test results  History of Anesthesia Complications Negative for: history of anesthetic complications  Airway Mallampati: I  TM Distance: >3 FB Neck ROM: Full    Dental  (+) Upper Dentures, Edentulous Upper, Dental Advisory Given, Caps   Pulmonary former smoker,  01/29/2020 SARS coronavirus positive   breath sounds clear to auscultation       Cardiovascular + dysrhythmias Atrial Fibrillation  Rhythm:Irregular Rate:Normal  12/2019 ECHO: EF 30- 35%. The LV has moderately decreased function/ global hypokinesis, mild TR   Neuro/Psych negative neurological ROS     GI/Hepatic Neg liver ROS, GERD  Medicated and Controlled,  Endo/Other  diabetes (glu 131)Morbid obesity  Renal/GU Renal InsufficiencyRenal disease     Musculoskeletal   Abdominal   Peds  Hematology eliquis   Anesthesia Other Findings   Reproductive/Obstetrics                            Anesthesia Physical Anesthesia Plan  ASA: III  Anesthesia Plan: General   Post-op Pain Management:    Induction: Intravenous  PONV Risk Score and Plan: 2 and Treatment may vary due to age or medical condition  Airway Management Planned: Natural Airway and Mask  Additional Equipment:   Intra-op Plan:   Post-operative Plan:   Informed Consent: I have reviewed the patients History and Physical, chart, labs and discussed the procedure including the risks, benefits and alternatives for the proposed anesthesia with the patient or authorized representative who has indicated his/her understanding and acceptance.     Dental advisory given  Plan Discussed with: CRNA and Surgeon  Anesthesia Plan Comments:         Anesthesia Quick Evaluation

## 2020-02-18 NOTE — Transfer of Care (Signed)
Immediate Anesthesia Transfer of Care Note  Patient: AHMANI PREHN  Procedure(s) Performed: CARDIOVERSION (N/A )  Patient Location: Endoscopy Unit  Anesthesia Type:General  Level of Consciousness: awake and drowsy  Airway & Oxygen Therapy: Patient Spontanous Breathing  Post-op Assessment: Report given to RN, Post -op Vital signs reviewed and stable and Patient moving all extremities X 4  Post vital signs: Reviewed and stable  Last Vitals:  Vitals Value Taken Time  BP    Temp    Pulse 68 02/18/20 1336  Resp 24 02/18/20 1336  SpO2 95 % 02/18/20 1336  Vitals shown include unvalidated device data.  Last Pain:  Vitals:   02/18/20 1311  TempSrc: Oral  PainSc: 0-No pain      Patients Stated Pain Goal: 0 (01/00/71 2197)  Complications: No apparent anesthesia complications

## 2020-02-18 NOTE — Care Management (Signed)
1537 02-18-20 Case Manager spoke with patient and he is aware of Tikosyn cost. Patient wants to use [facility] for medication. Patient would like a paper Rx for 30 day supply to take to [facility] or you can e-scribe. Patient wants refills to go to [facility] on [address]. Case Manager will continue to follow for additional transition of care needs. Bethena Roys, RN,BSN Case Manager

## 2020-02-18 NOTE — Progress Notes (Addendum)
Pharmacy: Dofetilide (Tikosyn) - Follow Up Assessment and Electrolyte Replacement  Pharmacy consulted to assist in monitoring and replacing electrolytes in this 74 y.o. male admitted on 02/16/2020 undergoing dofetilide initiation. Tikosyn 233mcg PO BID.  Labs:    Component Value Date/Time   K 4.3 02/18/2020 0935   MG 2.0 02/18/2020 0935     Plan: Potassium: K >/= 4: No additional supplementation needed  Magnesium: Mg 1.8-2: Give Mg 2gm IV x1. Continue to follow.  Continue potassium supplementation of 10 mEq daily.   Thank you for allowing pharmacy to participate in this patient's care   Laurey Arrow  [facility] PharmD Candidate 2022 02/18/2020  10:57 AM

## 2020-02-19 ENCOUNTER — Encounter (HOSPITAL_COMMUNITY): Payer: Commercial Managed Care - PPO | Admitting: Cardiology

## 2020-02-19 LAB — BASIC METABOLIC PANEL
Anion gap: 10 (ref 5–15)
BUN: 22 mg/dL (ref 8–23)
CO2: 25 mmol/L (ref 22–32)
Calcium: 9.4 mg/dL (ref 8.9–10.3)
Chloride: 104 mmol/L (ref 98–111)
Creatinine, Ser: 1.79 mg/dL — ABNORMAL HIGH (ref 0.61–1.24)
GFR calc Af Amer: 43 mL/min — ABNORMAL LOW (ref >60)
GFR calc non Af Amer: 37 mL/min — ABNORMAL LOW (ref >60)
Glucose, Bld: 117 mg/dL — ABNORMAL HIGH (ref 70–99)
Potassium: 4.3 mmol/L (ref 3.5–5.1)
Sodium: 139 mmol/L (ref 135–145)

## 2020-02-19 LAB — MAGNESIUM: Magnesium: 2.1 mg/dL (ref 1.7–2.4)

## 2020-02-19 MED ORDER — DOFETILIDE 125 MCG PO CAPS
125.0000 ug | ORAL_CAPSULE | Freq: Two times a day (BID) | ORAL | Status: DC
  Administered 2020-02-19 – 2020-02-20 (×3): 125 ug via ORAL
  Filled 2020-02-19 (×4): qty 1

## 2020-02-19 NOTE — Progress Notes (Addendum)
Electrophysiology Rounding Note  Patient Name: Melvin Ramirez Date of Encounter: 02/19/2020  Electrophysiologist: Clover Feehan   Subjective   The patient is doing well today.  At this time, the patient denies chest pain, shortness of breath, or any new concerns.  Inpatient Medications    Scheduled Meds: . apixaban  5 mg Oral BID  . atorvastatin  10 mg Oral Daily  . carvedilol  6.25 mg Oral BID WC  . digoxin  0.125 mg Oral Daily  . dofetilide  125 mcg Oral BID  . ferrous sulfate  325 mg Oral BID WC  . pantoprazole  40 mg Oral Daily  . potassium chloride  10 mEq Oral Daily  . sodium chloride flush  3 mL Intravenous Q12H  . torsemide  10 mg Oral Daily   Continuous Infusions: . sodium chloride     PRN Meds: sodium chloride, acetaminophen, sodium chloride flush   Vital Signs    Vitals:   02/18/20 1347 02/18/20 1413 02/18/20 2032 02/19/20 0605  BP: 102/72 113/73 112/79 104/68  Pulse: 72 68 72 67  Resp: 13 20 18 18   Temp:  97.7 F (36.5 C) 98.2 F (36.8 C) 98.5 F (36.9 C)  TempSrc:  Oral Oral Oral  SpO2: 96% 99% 98% 96%  Weight:    102.9 kg  Height:        Intake/Output Summary (Last 24 hours) at 02/19/2020 0826 Last data filed at 02/19/2020 0700 Gross per 24 hour  Intake 728.89 ml  Output 2400 ml  Net -1671.11 ml   Filed Weights   02/17/20 0424 02/18/20 0516 02/19/20 0605  Weight: 105.3 kg 104.5 kg 102.9 kg    Physical Exam    GEN- The patient is well appearing, alert and oriented x 3 today.   Head- normocephalic, atraumatic Eyes-  Sclera clear, conjunctiva pink Ears- hearing intact Oropharynx- clear Neck- supple Lungs- Clear to ausculation bilaterally, normal work of breathing Heart- Regular rate and rhythm  GI- soft, NT, ND, + BS Extremities- no clubbing, cyanosis, or edema Skin- no rash or lesion Psych- euthymic mood, full affect Neuro- strength and sensation are intact  Labs    CBC No results for input(s): WBC, NEUTROABS, HGB, HCT, MCV, PLT in  the last 72 hours. Basic Metabolic Panel Recent Labs    02/18/20 0935 02/19/20 0552  NA 138 139  K 4.3 4.3  CL 102 104  CO2 25 25  GLUCOSE 131* 117*  BUN 21 22  CREATININE 1.75* 1.79*  CALCIUM 9.5 9.4  MG 2.0 2.1    Telemetry    SR with PVC's (PVC's predate Tikosyn) (personally reviewed)  Radiology    No results found.   Patient Profile     SABAN HEINLEN is a 74 y.o. male admitted for Tikosyn load  Assessment & Plan    1.  Persistent atrial fibrillation S/p DCCV yesterday Continue Eliquis for CHADS2VASC of 3 QTc is a little long this morning Dr Rayann Heman reviewed, plan to decrease Tikosyn dose to 124mcg twice daily and watch overnight tonight  2.  CKD, stage III Stable No change required today   For questions or updates, please contact [facility] Please consult [email or web address] for contact info under Cardiology/STEMI.  Signed, Chanetta Marshall, NP  02/19/2020, 8:26 AM    I have seen, examined the patient, and reviewed the above assessment and plan.  Changes to above are made where necessary.  On exam, RRR.  Qt has prolonged slightly.  PVCs appear to be at  baseline. No polymorphic arrhythmias Reduce tikosyn to 125mg  BID and keep another day.  Co Sign: [name], MD

## 2020-02-19 NOTE — Progress Notes (Signed)
Pharmacy: Dofetilide (Tikosyn) - Follow Up Assessment and Electrolyte Replacement  Pharmacy consulted to assist in monitoring and replacing electrolytes in this 74 y.o. male admitted on 02/16/2020 undergoing dofetilide initiation.   Labs:    Component Value Date/Time   K 4.3 02/19/2020 0552   MG 2.1 02/19/2020 0552     Plan: Potassium: K >/= 4: No additional supplementation needed  Magnesium: Mg > 2: No additional supplementation needed   As patient has required on average 10 mEq of potassium replacement every day, recommend discharging patient with prescription for:  Potassium chloride 10 mEq  daily  Thank you for allowing pharmacy to participate in this patient's care   Hildred Laser, PharmD Clinical Pharmacist **Pharmacist phone directory can now be found on [email or web address] (PW [email or web address]).  Listed under [place].

## 2020-02-20 LAB — BASIC METABOLIC PANEL
Anion gap: 9 (ref 5–15)
BUN: 24 mg/dL — ABNORMAL HIGH (ref 8–23)
CO2: 25 mmol/L (ref 22–32)
Calcium: 9.2 mg/dL (ref 8.9–10.3)
Chloride: 104 mmol/L (ref 98–111)
Creatinine, Ser: 1.73 mg/dL — ABNORMAL HIGH (ref 0.61–1.24)
GFR calc Af Amer: 45 mL/min — ABNORMAL LOW (ref >60)
GFR calc non Af Amer: 39 mL/min — ABNORMAL LOW (ref >60)
Glucose, Bld: 121 mg/dL — ABNORMAL HIGH (ref 70–99)
Potassium: 4.5 mmol/L (ref 3.5–5.1)
Sodium: 138 mmol/L (ref 135–145)

## 2020-02-20 LAB — MAGNESIUM: Magnesium: 2 mg/dL (ref 1.7–2.4)

## 2020-02-20 MED ORDER — DOFETILIDE 125 MCG PO CAPS: 125.0000 ug | ORAL_CAPSULE | Freq: Two times a day (BID) | ORAL | 2 refills | Status: DC

## 2020-02-20 MED ORDER — MAGNESIUM SULFATE 2 GM/50ML IV SOLN
2.0000 g | Freq: Once | INTRAVENOUS | Status: AC
  Administered 2020-02-20: 2 g via INTRAVENOUS
  Filled 2020-02-20: qty 50

## 2020-02-20 NOTE — Progress Notes (Signed)
Doing well this am.  He continues to have PVCs which look very similar to prior to initiation of tikosyn.  Qt looks good.  I anticipate discharge to home today. Will follow-up next week in the CHF clinic for new consultation.  He will need bmet, mg and ekg then. Follow-up with AF clinic team in 4 weeks.   [name] MD, [place] 02/20/2020 8:32 AM

## 2020-02-20 NOTE — Plan of Care (Signed)
  Problem: Education: Goal: Knowledge of General Education information will improve Description: Including pain rating scale, medication(s)/side effects and non-pharmacologic comfort measures Outcome: Completed/Met   Problem: Health Behavior/Discharge Planning: Goal: Ability to manage health-related needs will improve Outcome: Completed/Met   Problem: Education: Goal: Knowledge of disease or condition will improve Outcome: Completed/Met Goal: Understanding of medication regimen will improve Outcome: Completed/Met Goal: Individualized Educational Video(s) Outcome: Completed/Met   Problem: Activity: Goal: Ability to tolerate increased activity will improve Outcome: Completed/Met   Problem: Cardiac: Goal: Ability to achieve and maintain adequate cardiopulmonary perfusion will improve Outcome: Completed/Met   Problem: Health Behavior/Discharge Planning: Goal: Ability to safely manage health-related needs after discharge will improve Outcome: Completed/Met

## 2020-02-20 NOTE — Discharge Summary (Signed)
Discharge Summary    Patient ID: Melvin Ramirez MRN: [id number]; DOB: [birth year]  Admit date: 02/16/2020 Discharge date: 02/20/2020  Primary Care Provider: [name], MD  Primary Cardiologist: Dr. Skeet Latch, MD  Primary Electrophysiologist: Dr. [name], MD   Discharge Diagnoses    Active Problems:   Chronic combined systolic and diastolic heart failure (HCC)   Persistent atrial fibrillation [facility])  Diagnostic Studies/Procedures    None   History of Present Illness     Melvin Ramirez is a 74 y.o. male with with a history of persistent atrial fibrillation, chronic systolic and diastolic heart failure, DM, CKD 3, RCC s/p nephrectomy, and obesity who presented for follow up in the [facility] 02/16/20.   He was admitted 11/2019 with GI bleeding and atrial fibrillation in the setting of recent COVID pneumonia. He had COVID pneumonia approximately 1 month prior to admission. He subsequently developed palpitations and cough. His heart rate was in the 160s and he was found to be in atrial fibrillation. Thyroid function was normal. He was started on anticoagulation and developed bleeding gastric ulcers that required clipping. He was found to be positive for H. Pylori. He was subsequently started on Eliquis for a CHADS2VASC score of 3. Echo that admission revealed LVEF 25 to 30% with global hypokinesis. Hypokinesis was worse than the anterior myocardium.He underwent successful DCCV on 12/17/19. Unfortunately, he had ERAF after DCCV. Repeat echo showed EF 30-35%. Patient is unaware of his arrhythmia.  Subsequently was seen 02/16/20 for dofetilide admission.   Hospital Course     He was initially placed on Tikosyn 285mcg BID with QTc at approximately 463ms on admission. He remained in AF therefore plan was for DCCV performed on 02/18/20 to NSR (with frequent PVCs) with one shock at 200J.  Post cardioversion QTc remained long on 02/19/20 therefore  Tikosyn dose was decreased to 14mcg BID. EKG today, 02/20/20, showed stable QTc at 466ms (confirmed with Dr. Rayann Heman) therefore patient is stable and ready for discharge today with close follow up.   He had issues with maintaining K+ greater than 4.0 and requiring daily supplementation. Pharmacy recommended discharge with Kdur 55meq PO QD.   K+ today is stable at 5.4. Continues to have PVCs>>>present prior to Tikosyn initiation.  Creatinine has been elevated as well, but appears to have a stable trend>>1.73 on day of discharge.   Message sent to scheduling for Atrial Fibrillation follow up appointment>>to call the patient with date and time. AHF appointment already scheduled.   Hospital issues include:  Persistent Atrial Fibrillation CHA2DS2-VASc score is 3, indicating a 3.2% annual risk of stroke.   S/p DCCV 12/17/19 with ERAF Aware of price of dofetilide>>>per care management  PharmD has screened drugs and no QT prolonging drugs on board QTc in SR 435 ms, Continue Coreg 6.25 mg BID Continue digoxin 0.125 mg daily Continue Eliquis 5 mg BID  Chronic systolic CHF EF mildly improved to 30-35% Patient scheduled to establish care with [facility]>>Per Dr Aundra Dubin next week  Consultants: None   Did the patient have an acute coronary syndrome (MI, NSTEMI, STEMI, etc) this admission?:  No                               Did the patient have a percutaneous coronary intervention (stent / angioplasty)?:  No.   _____________  Discharge Vitals Blood pressure 120/72, pulse 66, temperature 98.4 F (36.9 C), temperature source Oral,  resp. rate 20, height 6\' 1"  (1.854 m), weight 102.9 kg, SpO2 95 %.  Filed Weights   02/17/20 0424 02/18/20 0516 02/19/20 0605  Weight: 105.3 kg 104.5 kg 102.9 kg   Labs & Radiologic Studies    CBC No results for input(s): WBC, NEUTROABS, HGB, HCT, MCV, PLT in the last 72 hours. Basic Metabolic Panel Recent Labs    02/19/20 0552 02/20/20 0353  NA 139 138  K 4.3 4.5    CL 104 104  CO2 25 25  GLUCOSE 117* 121*  BUN 22 24*  CREATININE 1.79* 1.73*  CALCIUM 9.4 9.2  MG 2.1 2.0  _____________  No results found. Disposition   Pt is being discharged home today in good condition.  Follow-up Plans & Appointments   Follow-up Information    Larey Dresser, MD Follow up on 02/23/2020.   Specialty: Cardiology Why: at [facility] information: [place] [address] [phone]          Discharge Instructions    Call MD for:  difficulty breathing, headache or visual disturbances   Complete by: As directed    Call MD for:  extreme fatigue   Complete by: As directed    Call MD for:  hives   Complete by: As directed    Call MD for:  persistant dizziness or light-headedness   Complete by: As directed    Call MD for:  persistant nausea and vomiting   Complete by: As directed    Call MD for:  redness, tenderness, or signs of infection (pain, swelling, redness, odor or green/yellow discharge around incision site)   Complete by: As directed    Call MD for:  severe uncontrolled pain   Complete by: As directed    Call MD for:  temperature >100.4   Complete by: As directed    Diet - low sodium heart healthy   Complete by: As directed    Increase activity slowly   Complete by: As directed       Discharge Medications   Allergies as of 02/20/2020      Reactions   Novocain [procaine] Other (See Comments)   Syncope (passed out)      Medication List    TAKE these medications   Accu-Chek Softclix Lancets lancets   acetaminophen 500 MG tablet Commonly known as: TYLENOL Take 1,000 mg by mouth every 6 (six) hours as needed for mild pain or headache.   apixaban 5 MG Tabs tablet Commonly known as: ELIQUIS Take 1 tablet (5 mg total) by mouth 2 (two) times daily.   atorvastatin 10 MG tablet Commonly known as: LIPITOR Take 10 mg by mouth daily.   carvedilol 6.25 MG tablet Commonly known as: COREG Take 1 tablet (6.25 mg  total) by mouth 2 (two) times daily with a meal.   digoxin 0.125 MG tablet Commonly known as: LANOXIN Take 0.125 mg by mouth daily.   dofetilide 125 MCG capsule Commonly known as: TIKOSYN Take 1 capsule (125 mcg total) by mouth 2 (two) times daily.   ferrous sulfate 325 (65 FE) MG tablet Take 1 tablet (325 mg total) by mouth 2 (two) times daily with a meal.   Fish Oil 1000 MG Caps Take 1,000 mg by mouth every evening.   Garlic 9735 MG Caps Take 1,000 mg by mouth every evening.   Glucosamine HCl 1000 MG Tabs Take 1,000 mg by mouth every evening.   multivitamin with minerals Tabs tablet Take 1 tablet by mouth  daily with breakfast.   pantoprazole 40 MG tablet Commonly known as: PROTONIX Take 1 tablet (40 mg total) by mouth 2 (two) times daily. What changed: when to take this   potassium chloride 10 MEQ tablet Commonly known as: KLOR-CON Take 1 tablet (10 mEq total) by mouth daily.   Saw Palmetto 450 MG Caps Take 450 mg by mouth every evening.   torsemide 10 MG tablet Commonly known as: DEMADEX Take 10 mg by mouth in the morning.   V-R VITAMIN C 500 MG tablet Generic drug: ascorbic acid Take 500 mg by mouth every evening.        Outstanding Labs/Studies    Needs BMET, Mg+, EKG at follow up with AHF clinic.   Duration of Discharge Encounter   Greater than 30 minutes including physician time.  Signed, Kathyrn Drown, NP 02/20/2020, 11:13 AM

## 2020-02-20 NOTE — Plan of Care (Signed)
?  Problem: Education: ?Goal: Knowledge of General Education information will improve ?Description: Including pain rating scale, medication(s)/side effects and non-pharmacologic comfort measures ?Outcome: Progressing ?  ?Problem: Health Behavior/Discharge Planning: ?Goal: Ability to manage health-related needs will improve ?Outcome: Progressing ?  ?Problem: Education: ?Goal: Knowledge of disease or condition will improve ?Outcome: Progressing ?Goal: Understanding of medication regimen will improve ?Outcome: Progressing ?Goal: Individualized Educational Video(s) ?Outcome: Progressing ?  ?Problem: Activity: ?Goal: Ability to tolerate increased activity will improve ?Outcome: Progressing ?  ?Problem: Cardiac: ?Goal: Ability to achieve and maintain adequate cardiopulmonary perfusion will improve ?Outcome: Progressing ?  ?Problem: Health Behavior/Discharge Planning: ?Goal: Ability to safely manage health-related needs after discharge will improve ?Outcome: Progressing ?  ?

## 2020-02-20 NOTE — Progress Notes (Signed)
Pharmacy: Dofetilide (Tikosyn) - Follow Up Assessment and Electrolyte Replacement  Pharmacy consulted to assist in monitoring and replacing electrolytes in this 74 y.o. male admitted on 02/16/2020 undergoing dofetilide initiation.   Labs:    Component Value Date/Time   K 4.5 02/20/2020 0353   MG 2.0 02/20/2020 0353     Plan: Potassium: K >/= 4: No additional supplementation needed  Magnesium: Mg 1.8-2: Give Mg 2 gm IV x1    As patient has required on average 10 mEq of potassium replacement every day, recommend discharging patient with prescription for:  Potassium chloride 10 mEq  daily  Thank you for allowing pharmacy to participate in this patient's care   Lorel Monaco, PharmD PGY1 Ambulatory Care Resident [employer] # [phone]  **Pharmacist phone directory can now be found on [email or web address] (PW [email or web address]).  Listed under [place].

## 2020-02-23 ENCOUNTER — Other Ambulatory Visit (HOSPITAL_COMMUNITY): Payer: Self-pay

## 2020-02-23 ENCOUNTER — Encounter (HOSPITAL_COMMUNITY): Payer: Self-pay | Admitting: Cardiology

## 2020-02-23 ENCOUNTER — Other Ambulatory Visit: Payer: Self-pay

## 2020-02-23 ENCOUNTER — Ambulatory Visit (HOSPITAL_COMMUNITY)
Admit: 2020-02-23 | Discharge: 2020-02-23 | Disposition: A | Discharge status: Home / Self Care | Payer: Commercial Managed Care - PPO | Source: Ambulatory Visit | Attending: Cardiology | Admitting: Cardiology

## 2020-02-23 ENCOUNTER — Telehealth (HOSPITAL_COMMUNITY): Payer: Self-pay

## 2020-02-23 VITALS — BP 112/78 | HR 71 | Wt 240.0 lb

## 2020-02-23 DIAGNOSIS — E1122 Type 2 diabetes mellitus with diabetic chronic kidney disease: Secondary | ICD-10-CM | POA: Insufficient documentation

## 2020-02-23 DIAGNOSIS — Z905 Acquired absence of kidney: Secondary | ICD-10-CM | POA: Diagnosis not present

## 2020-02-23 DIAGNOSIS — I4891 Unspecified atrial fibrillation: Secondary | ICD-10-CM

## 2020-02-23 DIAGNOSIS — I5022 Chronic systolic (congestive) heart failure: Secondary | ICD-10-CM | POA: Diagnosis not present

## 2020-02-23 DIAGNOSIS — Z87891 Personal history of nicotine dependence: Secondary | ICD-10-CM | POA: Insufficient documentation

## 2020-02-23 DIAGNOSIS — Z7901 Long term (current) use of anticoagulants: Secondary | ICD-10-CM | POA: Insufficient documentation

## 2020-02-23 DIAGNOSIS — Z85528 Personal history of other malignant neoplasm of kidney: Secondary | ICD-10-CM | POA: Insufficient documentation

## 2020-02-23 DIAGNOSIS — I5042 Chronic combined systolic (congestive) and diastolic (congestive) heart failure: Secondary | ICD-10-CM | POA: Diagnosis not present

## 2020-02-23 DIAGNOSIS — Z8711 Personal history of peptic ulcer disease: Secondary | ICD-10-CM | POA: Diagnosis not present

## 2020-02-23 DIAGNOSIS — N183 Chronic kidney disease, stage 3 unspecified: Secondary | ICD-10-CM | POA: Insufficient documentation

## 2020-02-23 DIAGNOSIS — Z8616 Personal history of COVID-19: Secondary | ICD-10-CM | POA: Insufficient documentation

## 2020-02-23 DIAGNOSIS — Z79899 Other long term (current) drug therapy: Secondary | ICD-10-CM | POA: Diagnosis not present

## 2020-02-23 DIAGNOSIS — Z823 Family history of stroke: Secondary | ICD-10-CM | POA: Diagnosis not present

## 2020-02-23 DIAGNOSIS — Z8249 Family history of ischemic heart disease and other diseases of the circulatory system: Secondary | ICD-10-CM | POA: Diagnosis not present

## 2020-02-23 DIAGNOSIS — E785 Hyperlipidemia, unspecified: Secondary | ICD-10-CM | POA: Insufficient documentation

## 2020-02-23 DIAGNOSIS — Z86718 Personal history of other venous thrombosis and embolism: Secondary | ICD-10-CM | POA: Insufficient documentation

## 2020-02-23 DIAGNOSIS — I4819 Other persistent atrial fibrillation: Secondary | ICD-10-CM | POA: Diagnosis not present

## 2020-02-23 DIAGNOSIS — E1140 Type 2 diabetes mellitus with diabetic neuropathy, unspecified: Secondary | ICD-10-CM | POA: Insufficient documentation

## 2020-02-23 DIAGNOSIS — I429 Cardiomyopathy, unspecified: Secondary | ICD-10-CM | POA: Insufficient documentation

## 2020-02-23 LAB — BASIC METABOLIC PANEL
Anion gap: 10 (ref 5–15)
BUN: 30 mg/dL — ABNORMAL HIGH (ref 8–23)
CO2: 26 mmol/L (ref 22–32)
Calcium: 9.8 mg/dL (ref 8.9–10.3)
Chloride: 104 mmol/L (ref 98–111)
Creatinine, Ser: 1.85 mg/dL — ABNORMAL HIGH (ref 0.61–1.24)
GFR calc Af Amer: 41 mL/min — ABNORMAL LOW (ref >60)
GFR calc non Af Amer: 36 mL/min — ABNORMAL LOW (ref >60)
Glucose, Bld: 118 mg/dL — ABNORMAL HIGH (ref 70–99)
Potassium: 5.1 mmol/L (ref 3.5–5.1)
Sodium: 140 mmol/L (ref 135–145)

## 2020-02-23 LAB — CBC
HCT: 43.7 % (ref 39.0–52.0)
Hemoglobin: 13.9 g/dL (ref 13.0–17.0)
MCH: 31 pg (ref 26.0–34.0)
MCHC: 31.8 g/dL (ref 30.0–36.0)
MCV: 97.3 fL (ref 80.0–100.0)
Platelets: 214 10*3/uL (ref 150–400)
RBC: 4.49 MIL/uL (ref 4.22–5.81)
RDW: 13.4 % (ref 11.5–15.5)
WBC: 8 10*3/uL (ref 4.0–10.5)
nRBC: 0 % (ref 0.0–0.2)

## 2020-02-23 LAB — DIGOXIN LEVEL: Digoxin Level: 0.9 ng/mL (ref 0.8–2.0)

## 2020-02-23 MED ORDER — SACUBITRIL-VALSARTAN 24-26 MG PO TABS
1.0000 | ORAL_TABLET | Freq: Two times a day (BID) | ORAL | 5 refills | Status: DC
Start: 2020-02-23 — End: 2020-07-26

## 2020-02-23 NOTE — Telephone Encounter (Signed)
-----   Message from Larey Dresser, MD sent at 02/23/2020  1:51 PM EDT ----- Stop KCl supplement.  Needs repeat BMET in 1 week.

## 2020-02-23 NOTE — Telephone Encounter (Signed)
Pts wife aware of results. Advised to stop potassium.  Pt will have dccv on 5/24. Will repeat bmet at that time.

## 2020-02-23 NOTE — Addendum Note (Signed)
Addended by: Valeda Malm on: 02/23/2020 03:53 PM   Modules accepted: Orders, SmartSet

## 2020-02-23 NOTE — Progress Notes (Signed)
PCP: [name], MD Cardiology: Dr. Oval Linsey EP: Dr. Rayann Heman HF Cardiology: Dr. Aundra Dubin  74 y.o. with history of COVID-19 PNA, atrial fibrillation, and CHF was referred by Dr. Oval Linsey for evaluation of CHF.  Patient has history of renal cell carcinoma with right nephrectomy and diabetes, has CKD stage 3 as well.  He was feeling good until 1/21, when he developed COVID-19 PNA.  He was not hospitalized, but had a severe bout with it.  In 2/21, he was admitted with GI bleeding from gastric ulcers and new atrial fibrillation. He had EGD with clipping of ulcers.  Eventually, he was startedon Eliquis and had DCCV to NSR.  He additionally was noted to have RLE DVT in 2/21.  Echo in 2/21 hospitalization showed EF 25-30%.  Lexiscan Cardiolite showed inferior scar versus artifact and no ischemia.  Repeat echo in 3/21 showed EF still 30-35%.   He had ERAF after 3/21 DCCV and was re-admitted in 5/21 for Tikosyn initiation.  He had repeat DCCV to NSR this admission.  He was only on Tikosyn 125 mcg bid at time of discharge.   In the office today, he is back in atrial fibrillation.  He is not sure when he reverted to atrial fibrillation as he does not feel palpitations.  Rate is controlled in the 70s.  He has generalized fatigue but has no significant dyspnea.  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  He denies exertional dyspnea with his job.  No orthopnea/PND.  No lightheadedness.  No chest pain. No peripheral edema.   ECG (personally reviewed): atrial fibrillation rate 87, QTc 483  Labs (5/21): K 4.5, creatinine 1.73  PMH: 1. Atrial fibrillation: Paroxysmal.  - DCCV to NSR in 3/21, ERAF.   - Admitted in 5/21 for Tikosyn intiation and repeat DCCV to NSR.  2. 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 Renal cell carcinoma s/p right nephrectomy 4. CKD: Stage 3. 5. Hyperlipidemia.  6. H/o COVID-19 PNA in 1/21.  7. H/o GI bleeding  from gastric ulcers in 3/21.  8. Cardiomyopathy: Echo (2/21) with EF 25-30%.  - Cardiolite (2/21) with inferior scar, no ischemia.  - Echo (3/21): EF 30-35%, global hypokinesis, mild MR.  9. RLE DVT in 2/21 post-COVID-19 PNA.   Social History   Socioeconomic History  . Marital status: Married    Spouse name: Not on file  . Number of children: Not on file  . Years of education: Not on file  . Highest education level: Not on file  Occupational History  . Occupation: truck Geophysicist/field seismologist  Tobacco Use  . Smoking status: Former Smoker    Packs/day: 2.00    Years: 35.00    Pack years: 70.00    Types: Cigarettes    Quit date: 2000    Years since quitting: 21.3  . Smokeless tobacco: Never Used  Substance and Sexual Activity  . Alcohol use: Yes    Alcohol/week: 1.0 - 2.0 standard drinks    Types: 1 - 2 Cans of beer per week    Comment: Socially  . Drug use: No  . Sexual activity: Not on file  Other Topics Concern  . Not on file  Social History Narrative   Lives with wife in a one story home.  Has 3 children.     Works as a Administrator.     Education: 12th grade.   Social Determinants of Health  Financial Resource Strain:   . Difficulty of Paying Living Expenses:   Food Insecurity:   . Worried About Charity fundraiser in the Last Year:   . Arboriculturist in the Last Year:   Transportation Needs:   . Film/video editor (Medical):   Marland Kitchen Lack of Transportation (Non-Medical):   Physical Activity:   . Days of Exercise per Week:   . Minutes of Exercise per Session:   Stress:   . Feeling of Stress :   Social Connections:   . Frequency of Communication with Friends and Family:   . Frequency of Social Gatherings with Friends and Family:   . Attends Religious Services:   . Active Member of Clubs or Organizations:   . Attends Archivist Meetings:   Marland Kitchen Marital Status:   Intimate Partner Violence:   . Fear of Current or Ex-Partner:   . Emotionally Abused:   Marland Kitchen  Physically Abused:   . Sexually Abused:    Family History  Problem Relation Age of Onset  . Cancer Mother   . Heart attack Father   . Heart attack Brother   . Heart disease Sister   . Stroke Paternal Grandmother    ROS: All systems reviewed and negative except as per HPI.   Current Outpatient Medications  Medication Sig Dispense Refill  . Accu-Chek Softclix Lancets lancets     . acetaminophen (TYLENOL) 500 MG tablet Take 1,000 mg by mouth every 6 (six) hours as needed for mild pain or headache.    Marland Kitchen apixaban (ELIQUIS) 5 MG TABS tablet Take 1 tablet (5 mg total) by mouth 2 (two) times daily. 60 tablet 2  . ascorbic acid (V-R VITAMIN C) 500 MG tablet Take 500 mg by mouth every evening.    Marland Kitchen atorvastatin (LIPITOR) 10 MG tablet Take 10 mg by mouth daily.    . carvedilol (COREG) 6.25 MG tablet Take 1 tablet (6.25 mg total) by mouth 2 (two) times daily with a meal. 60 tablet 6  . digoxin (LANOXIN) 0.125 MG tablet Take 0.125 mg by mouth daily.     Marland Kitchen dofetilide (TIKOSYN) 125 MCG capsule Take 1 capsule (125 mcg total) by mouth 2 (two) times daily. 60 capsule 2  . ferrous sulfate 325 (65 FE) MG tablet Take 1 tablet (325 mg total) by mouth 2 (two) times daily with a meal.    . Garlic 5956 MG CAPS Take 1,000 mg by mouth every evening.    . Glucosamine HCl 1000 MG TABS Take 1,000 mg by mouth every evening.    . Multiple Vitamin (MULTIVITAMIN WITH MINERALS) TABS tablet Take 1 tablet by mouth daily with breakfast.     . Omega-3 Fatty Acids (FISH OIL) 1000 MG CAPS Take 1,000 mg by mouth every evening.     . pantoprazole (PROTONIX) 40 MG tablet Take 1 tablet (40 mg total) by mouth 2 (two) times daily. (Patient taking differently: Take 40 mg by mouth every evening. ) 60 tablet 3  . potassium chloride (KLOR-CON) 10 MEQ tablet Take 1 tablet (10 mEq total) by mouth daily. 30 tablet 3  . Saw Palmetto 450 MG CAPS Take 450 mg by mouth every evening.    . sacubitril-valsartan (ENTRESTO) 24-26 MG Take 1 tablet  by mouth 2 (two) times daily. 60 tablet 5   No current facility-administered medications for this encounter.   BP 112/78   Pulse 71   Wt 108.9 kg (240 lb)   SpO2 97%  BMI 31.66 kg/m  General: NAD Neck: No JVD, no thyromegaly or thyroid nodule.  Lungs: Clear to auscultation bilaterally with normal respiratory effort. CV: Nondisplaced PMI.  Heart irregular S1/S2, no S3/S4, no murmur.  No peripheral edema.  No carotid bruit.  Normal pedal pulses.  Abdomen: Soft, nontender, no hepatosplenomegaly, no distention.  Skin: Intact without lesions or rashes.  Neurologic: Alert and oriented x 3.  Psych: Normal affect. Extremities: No clubbing or cyanosis.  HEENT: Normal.   Assessment/Plan: 1. Chronic systolic CHF: Echo in 7/61 with EF 25-30%, echo in 3/21 with EF 30-35%.  Cardiolite with inferior scar vs artifact, no ischemia. Cause of cardiomyopathy is uncertain.  Could be related to atrial fibrillation (though not currently in RVR).  Have not ruled out CAD or cardiomyopathy related to myocarditis (?COVID-19 myocarditis).  On exam, he is not volume overloaded.  NYHA class II symptoms (fatigue).  BP stable, medication titration complicated by stage 3 CKD.  - Continue Coreg 6.25 mg bid.  - Add Entresto 24/26 bid.  - Stop low dose torsemide and KCl for now.   - Will need BMET today and in 10 days.  - Also would like to get him back into NSR (see below) in case atrial fibrillation is the primary driver for cardiomyopathy.  - Repeat echo after he has been in NSR for 1-2 months.  If EF remains low, will need left/right heart cath and cardiac MRI if creatinine remains stable.  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  2. Atrial fibrillation: Persistent.  He is back in atrial fibrillation despite DCCV on Tikosyn.  As above, I would like to get him in NSR to see if this helps improve his LV function. He is on low dose Tikosyn at 125 mcg bid, QTc ok on today's ECG.  - BMET as above.  - I will  arrange for repeat DCCV, he has not missed apixaban.  We discussed risks/benefits of procedure and he agrees to it.  - Continue Tikosyn 125 mcg bid.  - Continue Eliquis 5 mg bid.  - I would like him considered for atrial fibrillation ablation, will send back to Dr. Rayann Heman for this. I do not think that low dose Tikosyn is going to keep him out of atrial fibrillation long-term, think our best chance will be ablation.  3. CKD: Stage 3.  Follow closely with medication changes.  4. H/o GI bleeding: Check CBC today.   [name] 02/23/2020

## 2020-02-23 NOTE — H&P (View-Only) (Signed)
PCP: [name], MD Cardiology: Dr. Oval Linsey EP: Dr. Rayann Heman HF Cardiology: Dr. Aundra Dubin  74 y.o. with history of COVID-19 PNA, atrial fibrillation, and CHF was referred by Dr. Oval Linsey for evaluation of CHF.  Patient has history of renal cell carcinoma with right nephrectomy and diabetes, has CKD stage 3 as well.  He was feeling good until 1/21, when he developed COVID-19 PNA.  He was not hospitalized, but had a severe bout with it.  In 2/21, he was admitted with GI bleeding from gastric ulcers and new atrial fibrillation. He had EGD with clipping of ulcers.  Eventually, he was startedon Eliquis and had DCCV to NSR.  He additionally was noted to have RLE DVT in 2/21.  Echo in 2/21 hospitalization showed EF 25-30%.  Lexiscan Cardiolite showed inferior scar versus artifact and no ischemia.  Repeat echo in 3/21 showed EF still 30-35%.   He had ERAF after 3/21 DCCV and was re-admitted in 5/21 for Tikosyn initiation.  He had repeat DCCV to NSR this admission.  He was only on Tikosyn 125 mcg bid at time of discharge.   In the office today, he is back in atrial fibrillation.  He is not sure when he reverted to atrial fibrillation as he does not feel palpitations.  Rate is controlled in the 70s.  He has generalized fatigue but has no significant dyspnea.  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  He denies exertional dyspnea with his job.  No orthopnea/PND.  No lightheadedness.  No chest pain. No peripheral edema.   ECG (personally reviewed): atrial fibrillation rate 87, QTc 483  Labs (5/21): K 4.5, creatinine 1.73  PMH: 1. Atrial fibrillation: Paroxysmal.  - DCCV to NSR in 3/21, ERAF.   - Admitted in 5/21 for Tikosyn intiation and repeat DCCV to NSR.  2. 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 Renal cell carcinoma s/p right nephrectomy 4. CKD: Stage 3. 5. Hyperlipidemia.  6. H/o COVID-19 PNA in 1/21.  7. H/o GI bleeding  from gastric ulcers in 3/21.  8. Cardiomyopathy: Echo (2/21) with EF 25-30%.  - Cardiolite (2/21) with inferior scar, no ischemia.  - Echo (3/21): EF 30-35%, global hypokinesis, mild MR.  9. RLE DVT in 2/21 post-COVID-19 PNA.   Social History   Socioeconomic History  . Marital status: Married    Spouse name: Not on file  . Number of children: Not on file  . Years of education: Not on file  . Highest education level: Not on file  Occupational History  . Occupation: truck Geophysicist/field seismologist  Tobacco Use  . Smoking status: Former Smoker    Packs/day: 2.00    Years: 35.00    Pack years: 70.00    Types: Cigarettes    Quit date: 2000    Years since quitting: 21.3  . Smokeless tobacco: Never Used  Substance and Sexual Activity  . Alcohol use: Yes    Alcohol/week: 1.0 - 2.0 standard drinks    Types: 1 - 2 Cans of beer per week    Comment: Socially  . Drug use: No  . Sexual activity: Not on file  Other Topics Concern  . Not on file  Social History Narrative   Lives with wife in a one story home.  Has 3 children.     Works as a Administrator.     Education: 12th grade.   Social Determinants of Health  Financial Resource Strain:   . Difficulty of Paying Living Expenses:   Food Insecurity:   . Worried About Charity fundraiser in the Last Year:   . Arboriculturist in the Last Year:   Transportation Needs:   . Film/video editor (Medical):   Marland Kitchen Lack of Transportation (Non-Medical):   Physical Activity:   . Days of Exercise per Week:   . Minutes of Exercise per Session:   Stress:   . Feeling of Stress :   Social Connections:   . Frequency of Communication with Friends and Family:   . Frequency of Social Gatherings with Friends and Family:   . Attends Religious Services:   . Active Member of Clubs or Organizations:   . Attends Archivist Meetings:   Marland Kitchen Marital Status:   Intimate Partner Violence:   . Fear of Current or Ex-Partner:   . Emotionally Abused:   Marland Kitchen  Physically Abused:   . Sexually Abused:    Family History  Problem Relation Age of Onset  . Cancer Mother   . Heart attack Father   . Heart attack Brother   . Heart disease Sister   . Stroke Paternal Grandmother    ROS: All systems reviewed and negative except as per HPI.   Current Outpatient Medications  Medication Sig Dispense Refill  . Accu-Chek Softclix Lancets lancets     . acetaminophen (TYLENOL) 500 MG tablet Take 1,000 mg by mouth every 6 (six) hours as needed for mild pain or headache.    Marland Kitchen apixaban (ELIQUIS) 5 MG TABS tablet Take 1 tablet (5 mg total) by mouth 2 (two) times daily. 60 tablet 2  . ascorbic acid (V-R VITAMIN C) 500 MG tablet Take 500 mg by mouth every evening.    Marland Kitchen atorvastatin (LIPITOR) 10 MG tablet Take 10 mg by mouth daily.    . carvedilol (COREG) 6.25 MG tablet Take 1 tablet (6.25 mg total) by mouth 2 (two) times daily with a meal. 60 tablet 6  . digoxin (LANOXIN) 0.125 MG tablet Take 0.125 mg by mouth daily.     Marland Kitchen dofetilide (TIKOSYN) 125 MCG capsule Take 1 capsule (125 mcg total) by mouth 2 (two) times daily. 60 capsule 2  . ferrous sulfate 325 (65 FE) MG tablet Take 1 tablet (325 mg total) by mouth 2 (two) times daily with a meal.    . Garlic 3716 MG CAPS Take 1,000 mg by mouth every evening.    . Glucosamine HCl 1000 MG TABS Take 1,000 mg by mouth every evening.    . Multiple Vitamin (MULTIVITAMIN WITH MINERALS) TABS tablet Take 1 tablet by mouth daily with breakfast.     . Omega-3 Fatty Acids (FISH OIL) 1000 MG CAPS Take 1,000 mg by mouth every evening.     . pantoprazole (PROTONIX) 40 MG tablet Take 1 tablet (40 mg total) by mouth 2 (two) times daily. (Patient taking differently: Take 40 mg by mouth every evening. ) 60 tablet 3  . potassium chloride (KLOR-CON) 10 MEQ tablet Take 1 tablet (10 mEq total) by mouth daily. 30 tablet 3  . Saw Palmetto 450 MG CAPS Take 450 mg by mouth every evening.    . sacubitril-valsartan (ENTRESTO) 24-26 MG Take 1 tablet  by mouth 2 (two) times daily. 60 tablet 5   No current facility-administered medications for this encounter.   BP 112/78   Pulse 71   Wt 108.9 kg (240 lb)   SpO2 97%  BMI 31.66 kg/m  General: NAD Neck: No JVD, no thyromegaly or thyroid nodule.  Lungs: Clear to auscultation bilaterally with normal respiratory effort. CV: Nondisplaced PMI.  Heart irregular S1/S2, no S3/S4, no murmur.  No peripheral edema.  No carotid bruit.  Normal pedal pulses.  Abdomen: Soft, nontender, no hepatosplenomegaly, no distention.  Skin: Intact without lesions or rashes.  Neurologic: Alert and oriented x 3.  Psych: Normal affect. Extremities: No clubbing or cyanosis.  HEENT: Normal.   Assessment/Plan: 1. Chronic systolic CHF: Echo in 7/25 with EF 25-30%, echo in 3/21 with EF 30-35%.  Cardiolite with inferior scar vs artifact, no ischemia. Cause of cardiomyopathy is uncertain.  Could be related to atrial fibrillation (though not currently in RVR).  Have not ruled out CAD or cardiomyopathy related to myocarditis (?COVID-19 myocarditis).  On exam, he is not volume overloaded.  NYHA class II symptoms (fatigue).  BP stable, medication titration complicated by stage 3 CKD.  - Continue Coreg 6.25 mg bid.  - Add Entresto 24/26 bid.  - Stop low dose torsemide and KCl for now.   - Will need BMET today and in 10 days.  - Also would like to get him back into NSR (see below) in case atrial fibrillation is the primary driver for cardiomyopathy.  - Repeat echo after he has been in NSR for 1-2 months.  If EF remains low, will need left/right heart cath and cardiac MRI if creatinine remains stable.  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  2. Atrial fibrillation: Persistent.  He is back in atrial fibrillation despite DCCV on Tikosyn.  As above, I would like to get him in NSR to see if this helps improve his LV function. He is on low dose Tikosyn at 125 mcg bid, QTc ok on today's ECG.  - BMET as above.  - I will  arrange for repeat DCCV, he has not missed apixaban.  We discussed risks/benefits of procedure and he agrees to it.  - Continue Tikosyn 125 mcg bid.  - Continue Eliquis 5 mg bid.  - I would like him considered for atrial fibrillation ablation, will send back to Dr. Rayann Heman for this. I do not think that low dose Tikosyn is going to keep him out of atrial fibrillation long-term, think our best chance will be ablation.  3. CKD: Stage 3.  Follow closely with medication changes.  4. H/o GI bleeding: Check CBC today.   [name] 02/23/2020

## 2020-02-23 NOTE — Patient Instructions (Addendum)
STOP TORSEMIDE  START Entresto 24/26mg  (1 tab) twice a day  Labs today and repeat in 10 days We will only contact you if something comes back abnormal or we need to make some changes. Otherwise no news is good news!  Call Dr Bonita Quin office to schedule an appointment ASAP to discuss afib ablation.  Your physician recommends that you schedule a follow-up appointment in: 3 weeks with Dr Aundra Dubin  Please call office at [phone] option [phone] if you have any questions or concerns.    Dear Melvin Ramirez,  You are scheduled for a Cardioversion on Monday, May 24th, 2021 with Dr. Aundra Dubin.  Please arrive at the [facility] (Main Entrance A) at [facility]: [address] [place], [place] [address] at 7 am.   DIET: Nothing to eat or drink after midnight except a sip of water with medications (see medication instructions below)  Medication Instructions:  Take your morning heart medications with a small sip of water by 6am  Continue your anticoagulant: Eliquis You will need to continue your anticoagulant after your procedure until you are told by your provider that it is safe to stop   Labs: done today in office You will need a pre procedure COVID test    WHEN:  anytime between 9am-3pm WHERE: [facility]                 [place] [address]  This is a drive thru testing site, you will remain in your car. Be sure to get in the line FOR PROCEDURES Once you have been swabbed you will need to remain home in quarantine until you return for your procedure.   You must have a responsible person to drive you home and stay in the waiting area during your procedure. Failure to do so could result in cancellation.  Bring your insurance cards.  *Special Note: Every effort is made to have your procedure done on time. Occasionally there are emergencies that occur at the hospital that may cause delays. Please be patient if a delay does occur.

## 2020-02-25 ENCOUNTER — Ambulatory Visit (INDEPENDENT_AMBULATORY_CARE_PROVIDER_SITE_OTHER): Payer: Commercial Managed Care - PPO | Admitting: Emergency Medicine

## 2020-02-25 ENCOUNTER — Encounter: Payer: Self-pay | Admitting: Emergency Medicine

## 2020-02-25 ENCOUNTER — Other Ambulatory Visit: Payer: Self-pay

## 2020-02-25 DIAGNOSIS — J849 Interstitial pulmonary disease, unspecified: Secondary | ICD-10-CM

## 2020-02-25 DIAGNOSIS — J449 Chronic obstructive pulmonary disease, unspecified: Secondary | ICD-10-CM | POA: Diagnosis not present

## 2020-02-25 DIAGNOSIS — I4891 Unspecified atrial fibrillation: Secondary | ICD-10-CM

## 2020-02-25 NOTE — Assessment & Plan Note (Signed)
Mild interstitial disease noted, residual changes on his most recent CT scan of the chest, improved compared with February when he was hospitalized with COVID-19.  Some groundglass which could be consistent with mild edema from his atrial fibrillation and decreased LV function but I suspect that most of this is the sequela from COVID-19.  I think we reasonable to repeat his CT scan in 6 months to look for interval change.  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  If so then I will send him to our ILD clinic.

## 2020-02-25 NOTE — Progress Notes (Signed)
Subjective:    Patient ID: Melvin Ramirez, male    DOB: 1946/09/06, 74 y.o.   MRN: [id number]  HPI 74 year old former smoker (70 pack years) with probable COPD.  Carries diagnosis of diabetes with neuropathy and paresthesias, renal cell carcinoma post nephrectomy (remote) with associated chronic renal insufficiency.  He and his wife both unfortunately had COVID-19 in early January.  He required hospital admission 11/15/2019.  He did get the monocloncal antibody.  He was discharged on 11/26/2019.  Hospital course was complicated by some paroxysmal atrial fibrillation, gastric ulcers (EGD > clipped), lower extremity DVT for which she was started on Eliquis.  High-resolution CT scan of the chest was performed to evaluate pulmonary infiltrates.  This confirmed patchy bilateral paraseptal groundglass opacities, slightly improving compared with 10/26/2019, small dependent bilateral pleural effusions, dilated main pulmonary artery consistent with PAH.he was treated empirically for possible superimposed bacterial PNA. He does not feel back to full strength, does still have some cough. Does feel very week. He lost about 40 lbs through the illness. He is not back to work yet but wants to do so. Has not seen any melena, BRBPR. LVEF 25-30%. Dr Melvin Ramirez has recommended a stress test and a cardioversion on 3/11.   Autoimmune labs were sent 2/9 during his hospitalization, reviewed.  CCP, anti-SCL 70, SSA and SSB, RF, Double-stranded DNA, anti-GBM, ANCA, ANA were all negative.  ROV 02/25/20 --follow-up visit for 74 year old former smoker, history of diabetes, renal cell carcinoma post nephrectomy, associated chronic renal insufficiency.  He had COVID-19 in January 6378, course complicated by A. fib, gastric ulcer and lower extremity DVT that was treated with Eliquis.  He had a CT scan of the chest on 01/20/2020 which I have reviewed, shows mild basilar irregular interstitial opacities and occasional groundglass without any  honeycomb change, mild paraseptal emphysema. Improved compared with 11/17/19.  His pulmonary function testing is not yet been done because his COVID testing stayed positive.  He is working - does get fatigued with exertion, not really SOB. No cough, no wheeze or CP   He is planning to undergo DC cardioversion for his atrial fibrillation 02/29/2020, started on dofetilide in prep for this  MDM: Reviewed CT scan of the chest from 01/20/2020 Reviewed cardiology notes from 02/18/2020 and 02/23/2020   Review of Systems  Constitutional: Negative for fever and unexpected weight change.  HENT: Negative for congestion, dental problem, ear pain, nosebleeds, postnasal drip, rhinorrhea, sinus pressure, sneezing, sore throat and trouble swallowing.   Eyes: Negative for redness and itching.  Respiratory: Positive for shortness of breath. Negative for cough, chest tightness and wheezing.   Cardiovascular: Negative for palpitations and leg swelling.  Gastrointestinal: Negative for nausea and vomiting.  Genitourinary: Negative for dysuria.  Musculoskeletal: Negative for joint swelling.  Skin: Negative for rash.  Allergic/Immunologic: Negative.  Negative for environmental allergies, food allergies and immunocompromised state.  Neurological: Negative for headaches.  Hematological: Does not bruise/bleed easily.  Psychiatric/Behavioral: Negative for dysphoric mood. The patient is not nervous/anxious.     Past Medical History:  Diagnosis Date  . Chronic combined systolic and diastolic heart failure ([place]) 12/13/2019  . Chronic kidney disease   . CKD (chronic kidney disease) stage 3, GFR 30-59 ml/min 12/13/2019  . Diabetes mellitus ([place])   . Gastric ulcer 12/13/2019   +H. Pylori. Bleeding required clipping.  . H/O blood clots   . History of kidney cancer   . Persistent atrial fibrillation ([place]) 12/13/2019  . Renal cell carcinoma ([place]) 12/13/2019  S/p nephrectomy     Family History  Problem Relation Age of Onset   . Cancer Mother   . Heart attack Father   . Heart attack Brother   . Heart disease Sister   . Stroke Paternal Grandmother      Social History   Socioeconomic History  . Marital status: Married    Spouse name: Not on file  . Number of children: Not on file  . Years of education: Not on file  . Highest education level: Not on file  Occupational History  . Occupation: truck Geophysicist/field seismologist  Tobacco Use  . Smoking status: Former Smoker    Packs/day: 2.00    Years: 35.00    Pack years: 70.00    Types: Cigarettes    Quit date: 2000    Years since quitting: 21.3  . Smokeless tobacco: Never Used  Substance and Sexual Activity  . Alcohol use: Yes    Alcohol/week: 1.0 - 2.0 standard drinks    Types: 1 - 2 Cans of beer per week    Comment: Socially  . Drug use: No  . Sexual activity: Not on file  Other Topics Concern  . Not on file  Social History Narrative   Lives with wife in a one story home.  Has 3 children.     Works as a Administrator.     Education: 12th grade.   Social Determinants of Health   Financial Resource Strain:   . Difficulty of Paying Living Expenses:   Food Insecurity:   . Worried About Charity fundraiser in the Last Year:   . Arboriculturist in the Last Year:   Transportation Needs:   . Film/video editor (Medical):   Marland Kitchen Lack of Transportation (Non-Medical):   Physical Activity:   . Days of Exercise per Week:   . Minutes of Exercise per Session:   Stress:   . Feeling of Stress :   Social Connections:   . Frequency of Communication with Friends and Family:   . Frequency of Social Gatherings with Friends and Family:   . Attends Religious Services:   . Active Member of Clubs or Organizations:   . Attends Archivist Meetings:   Marland Kitchen Marital Status:   Intimate Partner Violence:   . Fear of Current or Ex-Partner:   . Emotionally Abused:   Marland Kitchen Physically Abused:   . Sexually Abused:    Truck driver, loads and unloads insulation.  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 In [place]. Once worked at [employer], [place]. Had a dust exposure.  No mold exposure.   Allergies  Allergen Reactions  . Novocain [Procaine] Other (See Comments)    Syncope (passed out)      Outpatient Medications Prior to Visit  Medication Sig Dispense Refill  . Accu-Chek Softclix Lancets lancets     . acetaminophen (TYLENOL) 500 MG tablet Take 1,000 mg by mouth every 6 (six) hours as needed for mild pain or headache.    Marland Kitchen apixaban (ELIQUIS) 5 MG TABS tablet Take 1 tablet (5 mg total) by mouth 2 (two) times daily. 60 tablet 2  . ascorbic acid (V-R VITAMIN C) 500 MG tablet Take 500 mg by mouth every evening.    Marland Kitchen atorvastatin (LIPITOR) 10 MG tablet Take 10 mg by mouth daily.    . carvedilol (COREG) 6.25 MG tablet Take 1 tablet (6.25 mg total) by mouth 2 (two) times daily with a meal. 60  tablet 6  . digoxin (LANOXIN) 0.125 MG tablet Take 0.125 mg by mouth daily.     Marland Kitchen dofetilide (TIKOSYN) 125 MCG capsule Take 1 capsule (125 mcg total) by mouth 2 (two) times daily. 60 capsule 2  . ferrous sulfate 325 (65 FE) MG tablet Take 1 tablet (325 mg total) by mouth 2 (two) times daily with a meal.    . Garlic 6168 MG CAPS Take 1,000 mg by mouth every evening.    . Glucosamine HCl 1000 MG TABS Take 1,000 mg by mouth every evening.    . Multiple Vitamin (MULTIVITAMIN WITH MINERALS) TABS tablet Take 1 tablet by mouth daily with breakfast.     . Omega-3 Fatty Acids (FISH OIL) 1000 MG CAPS Take 1,000 mg by mouth every evening.     . pantoprazole (PROTONIX) 40 MG tablet Take 1 tablet (40 mg total) by mouth 2 (two) times daily. (Patient taking differently: Take 40 mg by mouth every evening. ) 60 tablet 3  . sacubitril-valsartan (ENTRESTO) 24-26 MG Take 1 tablet by mouth 2 (two) times daily. 60 tablet 5  . Saw Palmetto 450 MG CAPS Take 450 mg by mouth every evening.     No facility-administered medications prior to visit.        Objective:   Physical  Exam Vitals:   02/25/20 1637  BP: 110/68  Pulse: (!) 54  Temp: 98.6 F (37 C)  TempSrc: Temporal  SpO2: 98%  Weight: 239 lb 3.2 oz (108.5 kg)  Height: 6\' 1"  (1.854 m)   Gen: Pleasant, well-nourished, in no distress,  normal affect  ENT: No lesions,  mouth clear,  oropharynx clear, no postnasal drip  Neck: No JVD, no stridor  Lungs: No use of accessory muscles, distant, coarse, no wheeze  Cardiovascular: RRR, heart sounds normal, no murmur or gallops, trace peripheral edema  Musculoskeletal: No deformities, no cyanosis or clubbing  Neuro: alert, awake, non focal  Skin: Warm, no lesions or rash     Assessment & Plan:  ILD (interstitial lung disease) (HCC) Mild interstitial disease noted, residual changes on his most recent CT scan of the chest, improved compared with February when he was hospitalized with COVID-19.  Some groundglass which could be consistent with mild edema from his atrial fibrillation and decreased LV function but I suspect that most of this is the sequela from COVID-19.  I think we reasonable to repeat his CT scan in 6 months to look for interval change.  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  If so then I will send him to our ILD clinic.  COPD (chronic obstructive pulmonary disease) (HCC) Presumed COPD.  He has not had PFT yet because he was Covid positive.  We will check PFT in 6 months and depending on results consider bronchodilator therapy.  Atrial fibrillation with [facility]) He is working with cardiology.  Just started dofetilide and is planning for another DC cardioversion 5/25.  Baltazar Apo, MD, PhD 02/25/2020, 5:20 PM [facility] [phone] or if no answer [phone]

## 2020-02-25 NOTE — Assessment & Plan Note (Signed)
Presumed COPD.  He has not had PFT yet because he was Covid positive.  We will check PFT in 6 months and depending on results consider bronchodilator therapy.

## 2020-02-25 NOTE — Assessment & Plan Note (Signed)
He is working with cardiology.  Just started dofetilide and is planning for another DC cardioversion 5/25.

## 2020-02-25 NOTE — Patient Instructions (Addendum)
We will plan to repeat your CT chest in 6 months to compare with priors. We will perform pulmonary function testing in 6 months.  Good luck with your cardioversion.  Follow with Dr Lamonte Sakai in 5 months to assess your status and then arrange for your testing

## 2020-02-28 NOTE — Anesthesia Preprocedure Evaluation (Addendum)
Anesthesia Evaluation  Patient identified by MRN, date of birth, ID band Patient awake    Reviewed: Allergy & Precautions, NPO status , Patient's Chart, lab work & pertinent test results, reviewed documented beta blocker date and time   Airway Mallampati: I  TM Distance: >3 FB Neck ROM: Full    Dental  (+) Lower Dentures, Upper Dentures   Pulmonary pneumonia, resolved, COPD, former smoker,    Pulmonary exam normal breath sounds clear to auscultation       Cardiovascular hypertension, Pt. on medications and Pt. on home beta blockers Normal cardiovascular exam Rhythm:Irregular Rate:Normal  12/2019 ECHO: EF 30- 35%. The LV has moderately decreased function/ global hypokinesis, mild TR  EKG- Atrial fibrillation,LAD, prolonged QT   Neuro/Psych Diabetic polyneuropathy  Neuromuscular disease negative psych ROS   GI/Hepatic Neg liver ROS, PUD,   Endo/Other  diabetes, Well Controlled, Type 2  Renal/GU Renal InsufficiencyRenal diseaseHx/o renal cell Ca S/P nephrectomy  negative genitourinary   Musculoskeletal negative musculoskeletal ROS (+)   Abdominal (+) + obese,   Peds  Hematology Eliquis therapy- last dose   Anesthesia Other Findings   Reproductive/Obstetrics                           Anesthesia Physical Anesthesia Plan  ASA: III  Anesthesia Plan: General   Post-op Pain Management:    Induction: Intravenous  PONV Risk Score and Plan: Ondansetron and Treatment may vary due to age or medical condition  Airway Management Planned: Mask  Additional Equipment:   Intra-op Plan:   Post-operative Plan:   Informed Consent: I have reviewed the patients History and Physical, chart, labs and discussed the procedure including the risks, benefits and alternatives for the proposed anesthesia with the patient or authorized representative who has indicated his/her understanding and acceptance.      Dental advisory given  Plan Discussed with: CRNA and Surgeon  Anesthesia Plan Comments:        Anesthesia Quick Evaluation

## 2020-02-29 ENCOUNTER — Ambulatory Visit (HOSPITAL_COMMUNITY): Payer: Commercial Managed Care - PPO | Admitting: Anesthesiology

## 2020-02-29 ENCOUNTER — Other Ambulatory Visit (HOSPITAL_COMMUNITY): Payer: Self-pay

## 2020-02-29 ENCOUNTER — Other Ambulatory Visit: Payer: Self-pay

## 2020-02-29 ENCOUNTER — Ambulatory Visit (HOSPITAL_COMMUNITY)
Admission: RE | Admit: 2020-02-29 | Discharge: 2020-02-29 | Disposition: A | Discharge status: Home / Self Care | Payer: Commercial Managed Care - PPO | Attending: Cardiology | Admitting: Cardiology

## 2020-02-29 ENCOUNTER — Encounter (HOSPITAL_COMMUNITY)
Admission: RE | Disposition: A | Discharge status: Home / Self Care | Payer: Self-pay | Source: Home / Self Care | Attending: Cardiology

## 2020-02-29 ENCOUNTER — Encounter (HOSPITAL_COMMUNITY): Payer: Self-pay | Admitting: Cardiology

## 2020-02-29 DIAGNOSIS — E1122 Type 2 diabetes mellitus with diabetic chronic kidney disease: Secondary | ICD-10-CM | POA: Insufficient documentation

## 2020-02-29 DIAGNOSIS — I480 Paroxysmal atrial fibrillation: Secondary | ICD-10-CM | POA: Diagnosis present

## 2020-02-29 DIAGNOSIS — I4891 Unspecified atrial fibrillation: Secondary | ICD-10-CM | POA: Diagnosis not present

## 2020-02-29 DIAGNOSIS — Z8249 Family history of ischemic heart disease and other diseases of the circulatory system: Secondary | ICD-10-CM | POA: Insufficient documentation

## 2020-02-29 DIAGNOSIS — E785 Hyperlipidemia, unspecified: Secondary | ICD-10-CM | POA: Diagnosis not present

## 2020-02-29 DIAGNOSIS — I429 Cardiomyopathy, unspecified: Secondary | ICD-10-CM | POA: Insufficient documentation

## 2020-02-29 DIAGNOSIS — Z7901 Long term (current) use of anticoagulants: Secondary | ICD-10-CM | POA: Diagnosis not present

## 2020-02-29 DIAGNOSIS — Z87891 Personal history of nicotine dependence: Secondary | ICD-10-CM | POA: Insufficient documentation

## 2020-02-29 DIAGNOSIS — I5022 Chronic systolic (congestive) heart failure: Secondary | ICD-10-CM | POA: Diagnosis not present

## 2020-02-29 DIAGNOSIS — Z8616 Personal history of COVID-19: Secondary | ICD-10-CM | POA: Diagnosis not present

## 2020-02-29 DIAGNOSIS — Z8711 Personal history of peptic ulcer disease: Secondary | ICD-10-CM | POA: Diagnosis not present

## 2020-02-29 DIAGNOSIS — Z79899 Other long term (current) drug therapy: Secondary | ICD-10-CM | POA: Insufficient documentation

## 2020-02-29 DIAGNOSIS — N183 Chronic kidney disease, stage 3 unspecified: Secondary | ICD-10-CM | POA: Diagnosis not present

## 2020-02-29 DIAGNOSIS — Z86718 Personal history of other venous thrombosis and embolism: Secondary | ICD-10-CM | POA: Insufficient documentation

## 2020-02-29 DIAGNOSIS — Z85528 Personal history of other malignant neoplasm of kidney: Secondary | ICD-10-CM | POA: Diagnosis not present

## 2020-02-29 DIAGNOSIS — E1140 Type 2 diabetes mellitus with diabetic neuropathy, unspecified: Secondary | ICD-10-CM | POA: Insufficient documentation

## 2020-02-29 HISTORY — PX: CARDIOVERSION: SHX1299

## 2020-02-29 LAB — POCT I-STAT, CHEM 8
BUN: 28 mg/dL — ABNORMAL HIGH (ref 8–23)
Calcium, Ion: 1.28 mmol/L (ref 1.15–1.40)
Chloride: 108 mmol/L (ref 98–111)
Creatinine, Ser: 1.9 mg/dL — ABNORMAL HIGH (ref 0.61–1.24)
Glucose, Bld: 147 mg/dL — ABNORMAL HIGH (ref 70–99)
HCT: 40 % (ref 39.0–52.0)
Hemoglobin: 13.6 g/dL (ref 13.0–17.0)
Potassium: 4.5 mmol/L (ref 3.5–5.1)
Sodium: 140 mmol/L (ref 135–145)
TCO2: 24 mmol/L (ref 22–32)

## 2020-02-29 SURGERY — CARDIOVERSION
Anesthesia: General

## 2020-02-29 MED ORDER — LIDOCAINE 2% (20 MG/ML) 5 ML SYRINGE
INTRAMUSCULAR | Status: DC | PRN
  Administered 2020-02-29 (×2): 50 mg via INTRAVENOUS

## 2020-02-29 MED ORDER — SODIUM CHLORIDE 0.9 % IV SOLN: INTRAVENOUS | Status: DC | PRN

## 2020-02-29 MED ORDER — PROPOFOL 10 MG/ML IV BOLUS
INTRAVENOUS | Status: DC | PRN
  Administered 2020-02-29: 80 mg via INTRAVENOUS

## 2020-02-29 NOTE — Anesthesia Postprocedure Evaluation (Signed)
Anesthesia Post Note  Patient: Melvin Ramirez  Procedure(s) Performed: CARDIOVERSION (N/A )     Patient location during evaluation: PACU Anesthesia Type: General Level of consciousness: awake and alert and oriented Pain management: pain level controlled Vital Signs Assessment: post-procedure vital signs reviewed and stable Respiratory status: spontaneous breathing, nonlabored ventilation and respiratory function stable Cardiovascular status: blood pressure returned to baseline and stable Postop Assessment: no apparent nausea or vomiting Anesthetic complications: no    Last Vitals:  Vitals:   02/29/20 0834 02/29/20 0840  BP: 100/65 98/65  Pulse: 70 (!) 29  Resp: 15 17  Temp:    SpO2: 100% 97%    Last Pain:  Vitals:   02/29/20 0840  TempSrc:   PainSc: 0-No pain                 [name] A.

## 2020-02-29 NOTE — Interval H&P Note (Signed)
History and Physical Interval Note:  02/29/2020 8:17 AM  Melvin Ramirez  has presented today for surgery, with the diagnosis of AFIB.  The various methods of treatment have been discussed with the patient and family. After consideration of risks, benefits and other options for treatment, the patient has consented to  Procedure(s): CARDIOVERSION (N/A) as a surgical intervention.  The patient's history has been reviewed, patient examined, no change in status, stable for surgery.  I have reviewed the patient's chart and labs.  Questions were answered to the patient's satisfaction.     Ozell Ferrera [employer]

## 2020-02-29 NOTE — Transfer of Care (Signed)
Immediate Anesthesia Transfer of Care Note  Patient: Melvin Ramirez  Procedure(s) Performed: CARDIOVERSION (N/A )  Patient Location: Endoscopy Unit  Anesthesia Type:MAC  Level of Consciousness: drowsy  Airway & Oxygen Therapy: Patient Spontanous Breathing  Post-op Assessment: Report given to RN and Post -op Vital signs reviewed and stable  Post vital signs: Reviewed and stable  Last Vitals:  Vitals Value Taken Time  BP 90/63   Temp    Pulse 77   Resp 16   SpO2 98     Last Pain:  Vitals:   02/29/20 0700  TempSrc: Oral  PainSc: 0-No pain         Complications: No apparent anesthesia complications

## 2020-02-29 NOTE — Discharge Instructions (Signed)
Electrical Cardioversion Electrical cardioversion is the delivery of a jolt of electricity to restore a normal rhythm to the heart. A rhythm that is too fast or is not regular keeps the heart from pumping well. In this procedure, sticky patches or metal paddles are placed on the chest to deliver electricity to the heart from a device. This procedure may be done in an emergency if:  There is low or no blood pressure as a result of the heart rhythm.  Normal rhythm must be restored as fast as possible to protect the brain and heart from further damage.  It may save a life. This may also be a scheduled procedure for irregular or fast heart rhythms that are not immediately life-threatening. Tell a health care provider about:  Any allergies you have.  All medicines you are taking, including vitamins, herbs, eye drops, creams, and over-the-counter medicines.  Any problems you or family members have had with anesthetic medicines.  Any blood disorders you have.  Any surgeries you have had.  Any medical conditions you have.  Whether you are pregnant or may be pregnant. What are the risks? Generally, this is a safe procedure. However, problems may occur, including:  Allergic reactions to medicines.  A blood clot that breaks free and travels to other parts of your body.  The possible return of an abnormal heart rhythm within hours or days after the procedure.  Your heart stopping (cardiac arrest). This is rare. What happens before the procedure? Medicines  Your health care provider may have you start taking: ? Blood-thinning medicines (anticoagulants) so your blood does not clot as easily. ? Medicines to help stabilize your heart rate and rhythm.  Ask your health care provider about: ? Changing or stopping your regular medicines. This is especially important if you are taking diabetes medicines or blood thinners. ? Taking medicines such as aspirin and ibuprofen. These medicines can  thin your blood. Do not take these medicines unless your health care provider tells you to take them. ? Taking over-the-counter medicines, vitamins, herbs, and supplements. General instructions  Follow instructions from your health care provider about eating or drinking restrictions.  Plan to have someone take you home from the hospital or clinic.  If you will be going home right after the procedure, plan to have someone with you for 24 hours.  Ask your health care provider what steps will be taken to help prevent infection. These may include washing your skin with a germ-killing soap. What happens during the procedure?   An IV will be inserted into one of your veins.  Sticky patches (electrodes) or metal paddles may be placed on your chest.  You will be given a medicine to help you relax (sedative).  An electrical shock will be delivered. The procedure may vary among health care providers and hospitals. What can I expect after the procedure?  Your blood pressure, heart rate, breathing rate, and blood oxygen level will be monitored until you leave the hospital or clinic.  Your heart rhythm will be watched to make sure it does not change.  You may have some redness on the skin where the shocks were given. Follow these instructions at home:  Do not drive for 24 hours if you were given a sedative during your procedure.  Take over-the-counter and prescription medicines only as told by your health care provider.  Ask your health care provider how to check your pulse. Check it often.  Rest for 48 hours after the procedure or   as told by your health care provider.  Avoid or limit your caffeine use as told by your health care provider.  Keep all follow-up visits as told by your health care provider. This is important. Contact a health care provider if:  You feel like your heart is beating too quickly or your pulse is not regular.  You have a serious muscle cramp that does not go  away. Get help right away if:  You have discomfort in your chest.  You are dizzy or you feel faint.  You have trouble breathing or you are short of breath.  Your speech is slurred.  You have trouble moving an arm or leg on one side of your body.  Your fingers or toes turn cold or blue. Summary  Electrical cardioversion is the delivery of a jolt of electricity to restore a normal rhythm to the heart.  This procedure may be done right away in an emergency or may be a scheduled procedure if the condition is not an emergency.  Generally, this is a safe procedure.  After the procedure, check your pulse often as told by your health care provider. This information is not intended to replace advice given to you by your health care provider. Make sure you discuss any questions you have with your health care provider. Document Revised: 04/27/2019 Document Reviewed: 04/27/2019 Elsevier Patient Education  2020 Elsevier Inc.  

## 2020-02-29 NOTE — Procedures (Signed)
Electrical Cardioversion Procedure Note JEVON SHELLS [id number] 12-Sep-1946  Procedure: Electrical Cardioversion Indications:  Atrial Fibrillation  Procedure Details Consent: Risks of procedure as well as the alternatives and risks of each were explained to the (patient/caregiver).  Consent for procedure obtained. Time Out: Verified patient identification, verified procedure, site/side was marked, verified correct patient position, special equipment/implants available, medications/allergies/relevent history reviewed, required imaging and test results available.  Performed  Patient placed on cardiac monitor, pulse oximetry, supplemental oxygen as necessary.  Sedation given: Propofol per anesthesiology Pacer pads placed anterior and posterior chest.  Cardioverted 1 time(s).  Cardioverted at [place].  Evaluation Findings: Post procedure EKG shows: NSR Complications: None Patient did tolerate procedure well.   [name] 02/29/2020, 8:24 AM

## 2020-03-02 ENCOUNTER — Encounter (HOSPITAL_COMMUNITY): Payer: Commercial Managed Care - PPO | Admitting: Cardiology

## 2020-03-03 ENCOUNTER — Other Ambulatory Visit (HOSPITAL_COMMUNITY): Payer: Commercial Managed Care - PPO

## 2020-03-09 ENCOUNTER — Telehealth: Payer: Self-pay | Admitting: Cardiovascular Disease

## 2020-03-09 DIAGNOSIS — I5042 Chronic combined systolic (congestive) and diastolic (congestive) heart failure: Secondary | ICD-10-CM

## 2020-03-09 NOTE — Telephone Encounter (Signed)
He can get an echo at the end of this month, about 1 month post-DCCV.

## 2020-03-09 NOTE — Telephone Encounter (Signed)
   Pt's wife calling, they would like to know when pt can have another echo to check his ejection fraction so he can go back to work

## 2020-03-10 NOTE — Telephone Encounter (Signed)
Called pts wife no answer/vm not set up. Will try again later.

## 2020-03-11 NOTE — Telephone Encounter (Signed)
Orders Placed This Encounter  Procedures  . ECHOCARDIOGRAM COMPLETE    Standing Status:   Future    Standing Expiration Date:   03/11/2021    Order Specific Question:   Where should this test be performed    Answer:   [place]    Order Specific Question:   Perflutren DEFINITY (image enhancing agent) should be administered unless hypersensitivity or allergy exist    Answer:   Administer Perflutren    Order Specific Question:   Reason for exam-Echo    Answer:   Congestive Heart Failure  428.0 / I50.9    Order Specific Question:   Release to patient    Answer:   Immediate

## 2020-03-11 NOTE — Telephone Encounter (Signed)
Called patients wife and scheduled him for an echo for 6/28. Order placed and will get this approved through patients insurance

## 2020-03-15 ENCOUNTER — Telehealth: Payer: Self-pay

## 2020-03-15 NOTE — Telephone Encounter (Signed)
Spoke wit pts wife regarding his virtual visit on 03/16/20. Pts wife stated she will be able to assist her husband with the video visit. Pts wife questions and concerns were address.

## 2020-03-16 ENCOUNTER — Telehealth (HOSPITAL_COMMUNITY): Payer: Self-pay | Admitting: *Deleted

## 2020-03-16 ENCOUNTER — Telehealth (INDEPENDENT_AMBULATORY_CARE_PROVIDER_SITE_OTHER): Payer: Commercial Managed Care - PPO | Admitting: Internal Medicine

## 2020-03-16 ENCOUNTER — Encounter: Payer: Self-pay | Admitting: Internal Medicine

## 2020-03-16 ENCOUNTER — Telehealth: Payer: Self-pay | Admitting: *Deleted

## 2020-03-16 VITALS — BP 100/60 | HR 59 | Ht 73.0 in | Wt 233.0 lb

## 2020-03-16 DIAGNOSIS — I4819 Other persistent atrial fibrillation: Secondary | ICD-10-CM | POA: Diagnosis not present

## 2020-03-16 DIAGNOSIS — I519 Heart disease, unspecified: Secondary | ICD-10-CM | POA: Diagnosis not present

## 2020-03-16 DIAGNOSIS — I4891 Unspecified atrial fibrillation: Secondary | ICD-10-CM

## 2020-03-16 NOTE — Telephone Encounter (Signed)
-----   Message from [name], MD sent at 03/16/2020 12:22 PM EDT ----- Afib ablation  [facility]/ICE/ anesthesia  Cardiac CT (baseline creatinine is elevated)

## 2020-03-16 NOTE — H&P (View-Only) (Signed)
Electrophysiology TeleHealth Note   Due to national recommendations of social distancing due to COVID 19, an audio/video telehealth visit is felt to be most appropriate for this patient at this time.  See MyChart message from today for the patient's consent to telehealth for [facility].  Date:  03/16/2020   ID:  Melvin Ramirez, DOB 1945-10-16, MRN [id number]  Location: patient's home  Provider location:  [place]  Evaluation Performed: Follow-up visit  PCP:  [name], MD   Electrophysiologist:  Dr Rayann Heman  Chief Complaint:  palpitations  History of Present Illness:    Melvin Ramirez is a 74 y.o. male who presents via telehealth conferencing today.  Since last being seen in our clinic, the patient reports doing very well.  Today, he denies symptoms of palpitations, chest pain, shortness of breath,  lower extremity edema, dizziness, presyncope, or syncope.  The patient is otherwise without complaint today.   Past Medical History:  Diagnosis Date  . Chronic combined systolic and diastolic heart failure ([place]) 12/13/2019  . Chronic kidney disease   . CKD (chronic kidney disease) stage 3, GFR 30-59 ml/min 12/13/2019  . Diabetes mellitus ([place])   . Gastric ulcer 12/13/2019   +H. Pylori. Bleeding required clipping.  . H/O blood clots   . History of kidney cancer   . Persistent atrial fibrillation ([place]) 12/13/2019  . Renal cell carcinoma ([place]) 12/13/2019   S/p nephrectomy    Past Surgical History:  Procedure Laterality Date  . APPENDECTOMY    . BIOPSY  11/19/2019   Procedure: BIOPSY;  Surgeon: Ronald Lobo, MD;  Location: [place];  Service: Endoscopy;;  . CARDIOVERSION N/A 12/17/2019   Procedure: CARDIOVERSION;  Surgeon: Geralynn Rile, MD;  Location: [place];  Service: Cardiovascular;  Laterality: N/A;  . CARDIOVERSION N/A 02/18/2020   Procedure: CARDIOVERSION;  Surgeon: Jerline Pain, MD;  Location: [facility] ENDOSCOPY;  Service: Cardiovascular;  Laterality: N/A;   . CARDIOVERSION N/A 02/29/2020   Procedure: CARDIOVERSION;  Surgeon: Larey Dresser, MD;  Location: [facility] ENDOSCOPY;  Service: Cardiovascular;  Laterality: N/A;  . ESOPHAGOGASTRODUODENOSCOPY N/A 11/19/2019   Procedure: ESOPHAGOGASTRODUODENOSCOPY (EGD);  Surgeon: Ronald Lobo, MD;  Location: [facility] ENDOSCOPY;  Service: Endoscopy;  Laterality: N/A;  . HEMOSTASIS CLIP PLACEMENT  11/19/2019   Procedure: HEMOSTASIS CLIP PLACEMENT;  Surgeon: Ronald Lobo, MD;  Location: [place];  Service: Endoscopy;;  . NEPHRECTOMY      Current Outpatient Medications  Medication Sig Dispense Refill  . Accu-Chek Softclix Lancets lancets     . acetaminophen (TYLENOL) 500 MG tablet Take 1,000 mg by mouth every 6 (six) hours as needed for mild pain or headache.    Marland Kitchen apixaban (ELIQUIS) 5 MG TABS tablet Take 1 tablet (5 mg total) by mouth 2 (two) times daily. 60 tablet 2  . ascorbic acid (V-R VITAMIN C) 500 MG tablet Take 500 mg by mouth every evening.    Marland Kitchen atorvastatin (LIPITOR) 10 MG tablet Take 10 mg by mouth daily.    . carvedilol (COREG) 6.25 MG tablet Take 1 tablet (6.25 mg total) by mouth 2 (two) times daily with a meal. 60 tablet 6  . digoxin (LANOXIN) 0.125 MG tablet Take 0.125 mg by mouth daily.     Marland Kitchen dofetilide (TIKOSYN) 125 MCG capsule Take 1 capsule (125 mcg total) by mouth 2 (two) times daily. 60 capsule 2  . Garlic 7353 MG CAPS Take 1,000 mg by mouth every evening.    . Glucosamine HCl 1000 MG  TABS Take 1,000 mg by mouth every evening.    . Multiple Vitamin (MULTIVITAMIN WITH MINERALS) TABS tablet Take 1 tablet by mouth daily with breakfast.     . Omega-3 Fatty Acids (FISH OIL) 1000 MG CAPS Take 1,000 mg by mouth every evening.     . pantoprazole (PROTONIX) 40 MG tablet Take 1 tablet (40 mg total) by mouth 2 (two) times daily. 60 tablet 3  . sacubitril-valsartan (ENTRESTO) 24-26 MG Take 1 tablet by mouth 2 (two) times daily. 60 tablet 5  . Saw Palmetto 450 MG CAPS Take 450 mg by mouth every evening.      No current facility-administered medications for this visit.    Allergies:   Novocain [procaine]   Social History:  The patient  reports that he quit smoking about 21 years ago. His smoking use included cigarettes. He has a 70.00 pack-year smoking history. He has never used smokeless tobacco. He reports current alcohol use of about 1.0 - 2.0 standard drinks of alcohol per week. He reports that he does not use drugs.   ROS:  Please see the history of present illness.   All other systems are personally reviewed and negative.    Exam:    Vital Signs:  BP 100/60   Pulse (!) 59   Ht 6\' 1"  (1.854 m)   Wt 233 lb (105.7 kg)   BMI 30.74 kg/m   Well sounding and appearing, alert and conversant, regular work of breathing,  good skin color Eyes- anicteric, neuro- grossly intact, skin- no apparent rash or lesions or cyanosis, mouth- oral mucosa is pink  Labs/Other Tests and Data Reviewed:    Recent Labs: 11/15/2019: TSH 1.647 11/25/2019: ALT 167 02/20/2020: Magnesium 2.0 02/23/2020: Platelets 214 02/29/2020: BUN 28; Creatinine, Ser 1.90; Hemoglobin 13.6; Potassium 4.5; Sodium 140   Wt Readings from Last 3 Encounters:  03/16/20 233 lb (105.7 kg)  02/29/20 239 lb 3.2 oz (108.5 kg)  02/25/20 239 lb 3.2 oz (108.5 kg)     ASSESSMENT & PLAN:    1.  Persistent afib In the setting of severe LA enlargement  (LA 5.5 cm), our ability to maintain sinus long term is low.  He has failed medical therapy with tikosyn.    Chads2vasc score is 3.  he is anticoagulated with eliquis . Therapeutic strategies for afib including medicine (amiodarone) and ablation were discussed in detail with the patient today. Risk, benefits, and alternatives to EP study and radiofrequency ablation for afib were also discussed in detail today. These risks include but are not limited to stroke, bleeding, vascular damage, tamponade, perforation, damage to the esophagus, lungs, and other structures, pulmonary vein stenosis,  worsening renal function, and death. The patient understands these risk and wishes to proceed.  We will therefore proceed with catheter ablation at the next available time.  Carto, ICE, anesthesia are requested for the procedure.  Will also obtain cardiac CT prior to the procedure to exclude LAA thrombus and further evaluate atrial anatomy.  He has CRI and may require hydration prior to CT.  2. Nonischemic CM/ chronic systolic dysfunction Hopefully will improve with sinus rhythm, though as above, it will be very difficult to keep this gentleman in sinus long term.    Risks, benefits and potential toxicities for medications prescribed and/or refilled reviewed with patient today.     Patient Risk:  after full review of this patients clinical status, I feel that they are at hgh risk at this time.  Today, I have spent  20 minutes with the patient with telehealth technology discussing arrhythmia management .    Army Fossa, MD  03/16/2020 12:17 PM     [place] [address] [phone] (office) [phone] (fax)

## 2020-03-16 NOTE — Telephone Encounter (Signed)
Patient preauthorization for dofetilide approved through 03/09/21 through [employer]. [place] number is [id number]

## 2020-03-16 NOTE — Progress Notes (Signed)
Electrophysiology TeleHealth Note   Due to national recommendations of social distancing due to COVID 19, an audio/video telehealth visit is felt to be most appropriate for this patient at this time.  See MyChart message from today for the patient's consent to telehealth for [facility].  Date:  03/16/2020   ID:  Tomie China, DOB October 19, 1945, MRN [id number]  Location: patient's home  Provider location:  [place]  Evaluation Performed: Follow-up visit  PCP:  [name], MD   Electrophysiologist:  Dr Rayann Heman  Chief Complaint:  palpitations  History of Present Illness:    OWEN PRATTE is a 74 y.o. male who presents via telehealth conferencing today.  Since last being seen in our clinic, the patient reports doing very well.  Today, he denies symptoms of palpitations, chest pain, shortness of breath,  lower extremity edema, dizziness, presyncope, or syncope.  The patient is otherwise without complaint today.   Past Medical History:  Diagnosis Date  . Chronic combined systolic and diastolic heart failure ([place]) 12/13/2019  . Chronic kidney disease   . CKD (chronic kidney disease) stage 3, GFR 30-59 ml/min 12/13/2019  . Diabetes mellitus ([place])   . Gastric ulcer 12/13/2019   +H. Pylori. Bleeding required clipping.  . H/O blood clots   . History of kidney cancer   . Persistent atrial fibrillation ([place]) 12/13/2019  . Renal cell carcinoma ([place]) 12/13/2019   S/p nephrectomy    Past Surgical History:  Procedure Laterality Date  . APPENDECTOMY    . BIOPSY  11/19/2019   Procedure: BIOPSY;  Surgeon: Ronald Lobo, MD;  Location: [place];  Service: Endoscopy;;  . CARDIOVERSION N/A 12/17/2019   Procedure: CARDIOVERSION;  Surgeon: Geralynn Rile, MD;  Location: [place];  Service: Cardiovascular;  Laterality: N/A;  . CARDIOVERSION N/A 02/18/2020   Procedure: CARDIOVERSION;  Surgeon: Jerline Pain, MD;  Location: [facility] ENDOSCOPY;  Service: Cardiovascular;  Laterality: N/A;   . CARDIOVERSION N/A 02/29/2020   Procedure: CARDIOVERSION;  Surgeon: Larey Dresser, MD;  Location: [facility] ENDOSCOPY;  Service: Cardiovascular;  Laterality: N/A;  . ESOPHAGOGASTRODUODENOSCOPY N/A 11/19/2019   Procedure: ESOPHAGOGASTRODUODENOSCOPY (EGD);  Surgeon: Ronald Lobo, MD;  Location: [facility] ENDOSCOPY;  Service: Endoscopy;  Laterality: N/A;  . HEMOSTASIS CLIP PLACEMENT  11/19/2019   Procedure: HEMOSTASIS CLIP PLACEMENT;  Surgeon: Ronald Lobo, MD;  Location: [place];  Service: Endoscopy;;  . NEPHRECTOMY      Current Outpatient Medications  Medication Sig Dispense Refill  . Accu-Chek Softclix Lancets lancets     . acetaminophen (TYLENOL) 500 MG tablet Take 1,000 mg by mouth every 6 (six) hours as needed for mild pain or headache.    Marland Kitchen apixaban (ELIQUIS) 5 MG TABS tablet Take 1 tablet (5 mg total) by mouth 2 (two) times daily. 60 tablet 2  . ascorbic acid (V-R VITAMIN C) 500 MG tablet Take 500 mg by mouth every evening.    Marland Kitchen atorvastatin (LIPITOR) 10 MG tablet Take 10 mg by mouth daily.    . carvedilol (COREG) 6.25 MG tablet Take 1 tablet (6.25 mg total) by mouth 2 (two) times daily with a meal. 60 tablet 6  . digoxin (LANOXIN) 0.125 MG tablet Take 0.125 mg by mouth daily.     Marland Kitchen dofetilide (TIKOSYN) 125 MCG capsule Take 1 capsule (125 mcg total) by mouth 2 (two) times daily. 60 capsule 2  . Garlic 9233 MG CAPS Take 1,000 mg by mouth every evening.    . Glucosamine HCl 1000 MG  TABS Take 1,000 mg by mouth every evening.    . Multiple Vitamin (MULTIVITAMIN WITH MINERALS) TABS tablet Take 1 tablet by mouth daily with breakfast.     . Omega-3 Fatty Acids (FISH OIL) 1000 MG CAPS Take 1,000 mg by mouth every evening.     . pantoprazole (PROTONIX) 40 MG tablet Take 1 tablet (40 mg total) by mouth 2 (two) times daily. 60 tablet 3  . sacubitril-valsartan (ENTRESTO) 24-26 MG Take 1 tablet by mouth 2 (two) times daily. 60 tablet 5  . Saw Palmetto 450 MG CAPS Take 450 mg by mouth every evening.      No current facility-administered medications for this visit.    Allergies:   Novocain [procaine]   Social History:  The patient  reports that he quit smoking about 21 years ago. His smoking use included cigarettes. He has a 70.00 pack-year smoking history. He has never used smokeless tobacco. He reports current alcohol use of about 1.0 - 2.0 standard drinks of alcohol per week. He reports that he does not use drugs.   ROS:  Please see the history of present illness.   All other systems are personally reviewed and negative.    Exam:    Vital Signs:  BP 100/60   Pulse (!) 59   Ht 6\' 1"  (1.854 m)   Wt 233 lb (105.7 kg)   BMI 30.74 kg/m   Well sounding and appearing, alert and conversant, regular work of breathing,  good skin color Eyes- anicteric, neuro- grossly intact, skin- no apparent rash or lesions or cyanosis, mouth- oral mucosa is pink  Labs/Other Tests and Data Reviewed:    Recent Labs: 11/15/2019: TSH 1.647 11/25/2019: ALT 167 02/20/2020: Magnesium 2.0 02/23/2020: Platelets 214 02/29/2020: BUN 28; Creatinine, Ser 1.90; Hemoglobin 13.6; Potassium 4.5; Sodium 140   Wt Readings from Last 3 Encounters:  03/16/20 233 lb (105.7 kg)  02/29/20 239 lb 3.2 oz (108.5 kg)  02/25/20 239 lb 3.2 oz (108.5 kg)     ASSESSMENT & PLAN:    1.  Persistent afib In the setting of severe LA enlargement  (LA 5.5 cm), our ability to maintain sinus long term is low.  He has failed medical therapy with tikosyn.    Chads2vasc score is 3.  he is anticoagulated with eliquis . Therapeutic strategies for afib including medicine (amiodarone) and ablation were discussed in detail with the patient today. Risk, benefits, and alternatives to EP study and radiofrequency ablation for afib were also discussed in detail today. These risks include but are not limited to stroke, bleeding, vascular damage, tamponade, perforation, damage to the esophagus, lungs, and other structures, pulmonary vein stenosis,  worsening renal function, and death. The patient understands these risk and wishes to proceed.  We will therefore proceed with catheter ablation at the next available time.  Carto, ICE, anesthesia are requested for the procedure.  Will also obtain cardiac CT prior to the procedure to exclude LAA thrombus and further evaluate atrial anatomy.  He has CRI and may require hydration prior to CT.  2. Nonischemic CM/ chronic systolic dysfunction Hopefully will improve with sinus rhythm, though as above, it will be very difficult to keep this gentleman in sinus long term.    Risks, benefits and potential toxicities for medications prescribed and/or refilled reviewed with patient today.     Patient Risk:  after full review of this patients clinical status, I feel that they are at hgh risk at this time.  Today, I have spent  20 minutes with the patient with telehealth technology discussing arrhythmia management .    Army Fossa, MD  03/16/2020 12:17 PM     [place] [address] [phone] (office) [phone] (fax)

## 2020-03-16 NOTE — Telephone Encounter (Signed)
AFib ablation scheduled for 7/1. Reviewed instructions and sent via mychart. Wife states that pt doesn't need to be covid screened via hospital instructions.  Aware I will discuss this and let her know. Agreed to speak again this week/next to finalize instructions

## 2020-03-17 ENCOUNTER — Ambulatory Visit (HOSPITAL_COMMUNITY)
Admission: RE | Admit: 2020-03-17 | Discharge: 2020-03-17 | Disposition: A | Discharge status: Home / Self Care | Payer: Commercial Managed Care - PPO | Source: Ambulatory Visit | Attending: Cardiology | Admitting: Cardiology

## 2020-03-17 ENCOUNTER — Other Ambulatory Visit: Payer: Self-pay

## 2020-03-17 ENCOUNTER — Encounter (HOSPITAL_COMMUNITY): Payer: Self-pay | Admitting: Cardiology

## 2020-03-17 VITALS — BP 84/62 | HR 54 | Ht 73.0 in | Wt 245.4 lb

## 2020-03-17 DIAGNOSIS — I5022 Chronic systolic (congestive) heart failure: Secondary | ICD-10-CM | POA: Insufficient documentation

## 2020-03-17 DIAGNOSIS — Z8249 Family history of ischemic heart disease and other diseases of the circulatory system: Secondary | ICD-10-CM | POA: Diagnosis not present

## 2020-03-17 DIAGNOSIS — I4819 Other persistent atrial fibrillation: Secondary | ICD-10-CM

## 2020-03-17 DIAGNOSIS — N183 Chronic kidney disease, stage 3 unspecified: Secondary | ICD-10-CM | POA: Diagnosis not present

## 2020-03-17 DIAGNOSIS — E785 Hyperlipidemia, unspecified: Secondary | ICD-10-CM | POA: Insufficient documentation

## 2020-03-17 DIAGNOSIS — Z79899 Other long term (current) drug therapy: Secondary | ICD-10-CM | POA: Insufficient documentation

## 2020-03-17 DIAGNOSIS — I5042 Chronic combined systolic (congestive) and diastolic (congestive) heart failure: Secondary | ICD-10-CM

## 2020-03-17 DIAGNOSIS — E1140 Type 2 diabetes mellitus with diabetic neuropathy, unspecified: Secondary | ICD-10-CM | POA: Insufficient documentation

## 2020-03-17 DIAGNOSIS — R5383 Other fatigue: Secondary | ICD-10-CM | POA: Diagnosis not present

## 2020-03-17 DIAGNOSIS — E1122 Type 2 diabetes mellitus with diabetic chronic kidney disease: Secondary | ICD-10-CM | POA: Insufficient documentation

## 2020-03-17 DIAGNOSIS — Z7901 Long term (current) use of anticoagulants: Secondary | ICD-10-CM | POA: Diagnosis not present

## 2020-03-17 DIAGNOSIS — Z86718 Personal history of other venous thrombosis and embolism: Secondary | ICD-10-CM | POA: Diagnosis not present

## 2020-03-17 DIAGNOSIS — Z87891 Personal history of nicotine dependence: Secondary | ICD-10-CM | POA: Insufficient documentation

## 2020-03-17 DIAGNOSIS — I519 Heart disease, unspecified: Secondary | ICD-10-CM | POA: Diagnosis not present

## 2020-03-17 LAB — CBC
HCT: 40.4 % (ref 39.0–52.0)
Hemoglobin: 13 g/dL (ref 13.0–17.0)
MCH: 31 pg (ref 26.0–34.0)
MCHC: 32.2 g/dL (ref 30.0–36.0)
MCV: 96.4 fL (ref 80.0–100.0)
Platelets: 172 10*3/uL (ref 150–400)
RBC: 4.19 MIL/uL — ABNORMAL LOW (ref 4.22–5.81)
RDW: 14.2 % (ref 11.5–15.5)
WBC: 6.6 10*3/uL (ref 4.0–10.5)
nRBC: 0 % (ref 0.0–0.2)

## 2020-03-17 LAB — BASIC METABOLIC PANEL
Anion gap: 9 (ref 5–15)
BUN: 26 mg/dL — ABNORMAL HIGH (ref 8–23)
CO2: 20 mmol/L — ABNORMAL LOW (ref 22–32)
Calcium: 9 mg/dL (ref 8.9–10.3)
Chloride: 108 mmol/L (ref 98–111)
Creatinine, Ser: 1.71 mg/dL — ABNORMAL HIGH (ref 0.61–1.24)
GFR calc Af Amer: 45 mL/min — ABNORMAL LOW (ref >60)
GFR calc non Af Amer: 39 mL/min — ABNORMAL LOW (ref >60)
Glucose, Bld: 122 mg/dL — ABNORMAL HIGH (ref 70–99)
Potassium: 4.9 mmol/L (ref 3.5–5.1)
Sodium: 137 mmol/L (ref 135–145)

## 2020-03-17 LAB — DIGOXIN LEVEL: Digoxin Level: 0.9 ng/mL (ref 0.8–2.0)

## 2020-03-17 NOTE — Patient Instructions (Signed)
Continue current medications  Labs done today, your results will be available in MyChart, we will contact you for abnormal readings.  Echocardiogram will be rescheduled to a sooner appointment  Your physician recommends that you schedule a follow-up appointment in: 8 weeks  If you have any questions or concerns before your next appointment please send [place] a message through [place] or call our office at [phone].    TO LEAVE A MESSAGE FOR THE NURSE SELECT OPTION 2, PLEASE LEAVE A MESSAGE INCLUDING: . YOUR NAME . DATE OF BIRTH . CALL BACK NUMBER . REASON FOR CALL**this is important as we prioritize the call backs  [place] AS LONG AS YOU CALL BEFORE 4:00 PM  At the [facility], you and your health needs are our priority. As part of our continuing mission to provide you with exceptional heart care, we have created designated Provider Care Teams. These Care Teams include your primary Cardiologist (physician) and Advanced Practice Providers (APPs- Physician Assistants and Nurse Practitioners) who all work together to provide you with the care you need, when you need it.   You may see any of the following providers on your designated Care Team at your next follow up: Marland Kitchen Dr Glori Bickers . Dr [name] . Darrick Grinder, NP . Lyda Jester, PA . Audry Riles, PharmD   Please be sure to bring in all your medications bottles to every appointment.

## 2020-03-17 NOTE — Progress Notes (Signed)
PCP: [name], MD Cardiology: Dr. Oval Linsey EP: Dr. Rayann Heman HF Cardiology: Dr. Aundra Dubin  74 y.o. with history of COVID-19 PNA, atrial fibrillation, and CHF was referred by Dr. Oval Linsey for evaluation of CHF.  Patient has history of renal cell carcinoma with right nephrectomy and diabetes, has CKD stage 3 as well.  He was feeling good until 1/21, when he developed COVID-19 PNA.  He was not hospitalized, but had a severe bout with it.  In 2/21, he was admitted with GI bleeding from gastric ulcers and new atrial fibrillation. He had EGD with clipping of ulcers.  Eventually, he was startedon Eliquis and had DCCV to NSR.  He additionally was noted to have RLE DVT in 2/21.  Echo in 2/21 hospitalization showed EF 25-30%.  Lexiscan Cardiolite showed inferior scar versus artifact and no ischemia.  Repeat echo in 3/21 showed EF still 30-35%.   He had ERAF after 3/21 DCCV and was re-admitted in 5/21 for Tikosyn initiation.  He had repeat DCCV to NSR this admission.  He was only on Tikosyn 125 mcg bid at time of discharge.  He went back into atrial fibrillation again and I cardioverted him to NSR later in 5/21.   In the office today, he is back in atrial fibrillation.  He does not feel palpitations.  He has seen Dr. Rayann Heman and is planned for atrial fibrillation ablation in July.  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  No exertional dyspnea but he tires easily.  No lightheadedness.  No chest pain.  BP is low today.   ECG (personally reviewed): atrial fibrillation, PVCs, QTc 425 msec  Labs (5/21): K 4.5, creatinine 1.73 => 1.9, digoxin 0.9  PMH: 1. Atrial fibrillation: Paroxysmal.  - DCCV to NSR in 3/21, ERAF.   - Admitted in 5/21 for Tikosyn intiation and repeat DCCV to NSR.  2. 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 Renal cell carcinoma s/p right nephrectomy 4. CKD: Stage 3. 5. Hyperlipidemia.  6. H/o COVID-19 PNA in  1/21.  7. H/o GI bleeding from gastric ulcers in 3/21.  8. Cardiomyopathy: Echo (2/21) with EF 25-30%.  - Cardiolite (2/21) with inferior scar, no ischemia.  - Echo (3/21): EF 30-35%, global hypokinesis, mild MR.  9. RLE DVT in 2/21 post-COVID-19 PNA.   Social History   Socioeconomic History  . Marital status: Married    Spouse name: Not on file  . Number of children: Not on file  . Years of education: Not on file  . Highest education level: Not on file  Occupational History  . Occupation: truck Geophysicist/field seismologist  Tobacco Use  . Smoking status: Former Smoker    Packs/day: 2.00    Years: 35.00    Pack years: 70.00    Types: Cigarettes    Quit date: 2000    Years since quitting: 21.4  . Smokeless tobacco: Never Used  Vaping Use  . Vaping Use: Never used  Substance and Sexual Activity  . Alcohol use: Yes    Alcohol/week: 1.0 - 2.0 standard drink    Types: 1 - 2 Cans of beer per week    Comment: Socially  . Drug use: No  . Sexual activity: Not on file  Other Topics Concern  . Not on file  Social History Narrative   Lives with wife in a one story home.  Has 3 children.  Works as a Administrator.     Education: 12th grade.   Social Determinants of Health   Financial Resource Strain:   . Difficulty of Paying Living Expenses:   Food Insecurity:   . Worried About Charity fundraiser in the Last Year:   . Arboriculturist in the Last Year:   Transportation Needs:   . Film/video editor (Medical):   Marland Kitchen Lack of Transportation (Non-Medical):   Physical Activity:   . Days of Exercise per Week:   . Minutes of Exercise per Session:   Stress:   . Feeling of Stress :   Social Connections:   . Frequency of Communication with Friends and Family:   . Frequency of Social Gatherings with Friends and Family:   . Attends Religious Services:   . Active Member of Clubs or Organizations:   . Attends Archivist Meetings:   Marland Kitchen Marital Status:   Intimate Partner Violence:   . Fear  of Current or Ex-Partner:   . Emotionally Abused:   Marland Kitchen Physically Abused:   . Sexually Abused:    Family History  Problem Relation Age of Onset  . Cancer Mother   . Heart attack Father   . Heart attack Brother   . Heart disease Sister   . Stroke Paternal Grandmother    ROS: All systems reviewed and negative except as per HPI.   Current Outpatient Medications  Medication Sig Dispense Refill  . Accu-Chek Softclix Lancets lancets     . acetaminophen (TYLENOL) 500 MG tablet Take 1,000 mg by mouth every 6 (six) hours as needed for mild pain or headache.    Marland Kitchen apixaban (ELIQUIS) 5 MG TABS tablet Take 1 tablet (5 mg total) by mouth 2 (two) times daily. 60 tablet 2  . ascorbic acid (V-R VITAMIN C) 500 MG tablet Take 500 mg by mouth every evening.    Marland Kitchen atorvastatin (LIPITOR) 10 MG tablet Take 10 mg by mouth daily.    . carvedilol (COREG) 6.25 MG tablet Take 1 tablet (6.25 mg total) by mouth 2 (two) times daily with a meal. 60 tablet 6  . digoxin (LANOXIN) 0.125 MG tablet Take 0.125 mg by mouth daily.     Marland Kitchen dofetilide (TIKOSYN) 125 MCG capsule Take 1 capsule (125 mcg total) by mouth 2 (two) times daily. 60 capsule 2  . Garlic 9702 MG CAPS Take 1,000 mg by mouth every evening.    . Glucosamine HCl 1000 MG TABS Take 1,000 mg by mouth every evening.    . Multiple Vitamin (MULTIVITAMIN WITH MINERALS) TABS tablet Take 1 tablet by mouth daily with breakfast.     . Omega-3 Fatty Acids (FISH OIL) 1000 MG CAPS Take 1,000 mg by mouth every evening.     . pantoprazole (PROTONIX) 40 MG tablet Take 1 tablet (40 mg total) by mouth 2 (two) times daily. 60 tablet 3  . sacubitril-valsartan (ENTRESTO) 24-26 MG Take 1 tablet by mouth 2 (two) times daily. 60 tablet 5  . Saw Palmetto 450 MG CAPS Take 450 mg by mouth every evening.     No current facility-administered medications for this encounter.   BP (!) 84/62   Pulse (!) 54   Ht 6\' 1"  (1.854 m)   Wt 111.3 kg (245 lb 6.4 oz)   SpO2 96%   BMI 32.38 kg/m   General: NAD Neck: No JVD, no thyromegaly or thyroid nodule.  Lungs: Clear to auscultation bilaterally with normal respiratory effort. CV: Nondisplaced  PMI.  Heart irregular S1/S2, no S3/S4, no murmur.  1+ ankle edema.  No carotid bruit.  Normal pedal pulses.  Abdomen: Soft, nontender, no hepatosplenomegaly, no distention.  Skin: Intact without lesions or rashes.  Neurologic: Alert and oriented x 3.  Psych: Normal affect. Extremities: No clubbing or cyanosis.  HEENT: Normal.   Assessment/Plan: 1. Chronic systolic CHF: Echo in 7/02 with EF 25-30%, echo in 3/21 with EF 30-35%.  Cardiolite with inferior scar vs artifact, no ischemia. Cause of cardiomyopathy is uncertain.  Could be related to atrial fibrillation (though not currently in RVR).  Have not ruled out CAD or cardiomyopathy related to myocarditis (?COVID-19 myocarditis).  On exam, he is not volume overloaded.  NYHA class II symptoms (fatigue).  Medication titration complicated by stage 3 CKD. BP is too low today to increase meds.  - Continue Coreg 6.25 mg bid.  - Continue Entresto 24/26 bid.  - I do not think that he needs a diuretic currently.  - Continue digoxin, check level today.    - BMET today.   - Repeat echo.  If EF remains low, will need left/right heart cath and cardiac MRI. 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  2. Atrial fibrillation: Persistent.  He is back in atrial fibrillation despite DCCV x 2 on Tikosyn.  I would like to get him in NSR to see if this helps improve his LV function. He is on low dose Tikosyn at 125 mcg bid, QTc ok on today's ECG.  - BMET as above.  - I do not think that DCCV will hold long-term even with Tikosyn use.  He is planned for atrial fibrillation ablation with Dr. Rayann Heman.  - Continue Tikosyn 125 mcg bid for now.  - Continue Eliquis 5 mg bid.  3. CKD: Stage 3.  BMET today.    Followup 6 wks.   [name] 03/17/2020

## 2020-03-18 NOTE — Telephone Encounter (Signed)
Per patient navigator-Pt needs IV fluids prior to cardiac CT   Spoke with Pt's wife.  Patient has recent lab work.  No further lab work needed.  Pt had positive covid test 01/29/2020.  Per [facility] policy, does not need to be retested for 90 days. NO COVID TEST needed.  Ablation scheduled for 04/07/2020 at 10:30 am.  No labs/covid test needed.  Cardiac CT-needs IV fluids prior  Sending instruction letter via [place].  Pt's wife will send list of Pt's heart rates over last month.  At this time appears most recent heart rates are in the 50's.  Will have Pt take normal dose of carvedilol prior to CT.  Work up complete.

## 2020-03-21 ENCOUNTER — Other Ambulatory Visit: Payer: Self-pay

## 2020-03-21 ENCOUNTER — Ambulatory Visit (HOSPITAL_COMMUNITY)
Admission: RE | Admit: 2020-03-21 | Discharge: 2020-03-21 | Disposition: A | Discharge status: Home / Self Care | Payer: Commercial Managed Care - PPO | Source: Ambulatory Visit | Attending: Cardiology | Admitting: Cardiology

## 2020-03-21 DIAGNOSIS — I5042 Chronic combined systolic (congestive) and diastolic (congestive) heart failure: Secondary | ICD-10-CM | POA: Insufficient documentation

## 2020-03-21 NOTE — Progress Notes (Signed)
  Echocardiogram 2D Echocardiogram has been performed.  Jennette Dubin 03/21/2020, 3:53 PM

## 2020-03-22 ENCOUNTER — Ambulatory Visit (HOSPITAL_COMMUNITY): Payer: Commercial Managed Care - PPO | Admitting: Physician Assistant

## 2020-03-22 NOTE — Addendum Note (Signed)
Addended by: Willeen Cass A on: 03/22/2020 02:58 PM   Modules accepted: Orders

## 2020-03-23 ENCOUNTER — Encounter (HOSPITAL_COMMUNITY): Payer: Self-pay | Admitting: *Deleted

## 2020-03-28 ENCOUNTER — Other Ambulatory Visit (HOSPITAL_COMMUNITY): Payer: Self-pay | Admitting: *Deleted

## 2020-03-28 MED ORDER — APIXABAN 5 MG PO TABS: 5.0000 mg | ORAL_TABLET | Freq: Two times a day (BID) | ORAL | 2 refills | Status: DC

## 2020-03-30 ENCOUNTER — Telehealth (HOSPITAL_COMMUNITY): Payer: Self-pay | Admitting: *Deleted

## 2020-03-30 NOTE — Telephone Encounter (Signed)
Reaching out to patient to offer assistance regarding upcoming cardiac imaging study; pt's wife verbalizes understanding of appt date/time, parking situation and where to check in, pre-test NPO status and medications ordered, and verified current allergies; name and call back number provided for further questions should they arise  [facility] [phone] office [phone] cell  Pt's wife verbalized understanding of going to "Admitting" to check-in for "medical day" at noon on 6/25.

## 2020-04-01 ENCOUNTER — Ambulatory Visit (HOSPITAL_COMMUNITY)
Admission: RE | Admit: 2020-04-01 | Discharge: 2020-04-01 | Disposition: A | Discharge status: Home / Self Care | Payer: Commercial Managed Care - PPO | Source: Ambulatory Visit | Attending: Internal Medicine | Admitting: Internal Medicine

## 2020-04-01 ENCOUNTER — Other Ambulatory Visit: Payer: Self-pay

## 2020-04-01 DIAGNOSIS — I4891 Unspecified atrial fibrillation: Secondary | ICD-10-CM

## 2020-04-01 LAB — BASIC METABOLIC PANEL
Anion gap: 10 (ref 5–15)
BUN: 29 mg/dL — ABNORMAL HIGH (ref 8–23)
CO2: 21 mmol/L — ABNORMAL LOW (ref 22–32)
Calcium: 9 mg/dL (ref 8.9–10.3)
Chloride: 106 mmol/L (ref 98–111)
Creatinine, Ser: 1.76 mg/dL — ABNORMAL HIGH (ref 0.61–1.24)
GFR calc Af Amer: 44 mL/min — ABNORMAL LOW (ref >60)
GFR calc non Af Amer: 38 mL/min — ABNORMAL LOW (ref >60)
Glucose, Bld: 109 mg/dL — ABNORMAL HIGH (ref 70–99)
Potassium: 5.2 mmol/L — ABNORMAL HIGH (ref 3.5–5.1)
Sodium: 137 mmol/L (ref 135–145)

## 2020-04-01 MED ORDER — SODIUM CHLORIDE 0.9 % WEIGHT BASED INFUSION: 1.0000 mL/kg/h | INTRAVENOUS | Status: DC

## 2020-04-01 MED ORDER — IOHEXOL 350 MG/ML SOLN
80.0000 mL | Freq: Once | INTRAVENOUS | Status: AC | PRN
  Administered 2020-04-01: 80 mL via INTRAVENOUS

## 2020-04-01 MED ORDER — SODIUM CHLORIDE 0.9 % WEIGHT BASED INFUSION
3.0000 mL/kg/h | INTRAVENOUS | Status: AC
  Administered 2020-04-01: 3 mL/kg/h via INTRAVENOUS

## 2020-04-04 ENCOUNTER — Other Ambulatory Visit (HOSPITAL_COMMUNITY): Payer: Commercial Managed Care - PPO

## 2020-04-06 ENCOUNTER — Telehealth: Payer: Self-pay | Admitting: Internal Medicine

## 2020-04-06 NOTE — Telephone Encounter (Signed)
Wife of the patient called. She wanted to confirm some pre-procedure instructions. She wanted to know if the patient is supposed to take any of his heart medication tomorrow.  Please call to discuss instructions

## 2020-04-06 NOTE — Telephone Encounter (Signed)
Returned call to Pt's wife.  Reiterated pre procedure instructions.  All questions answered.

## 2020-04-07 ENCOUNTER — Encounter (HOSPITAL_COMMUNITY): Payer: Self-pay | Admitting: Internal Medicine

## 2020-04-07 ENCOUNTER — Ambulatory Visit (HOSPITAL_COMMUNITY): Payer: Commercial Managed Care - PPO | Admitting: Anesthesiology

## 2020-04-07 ENCOUNTER — Ambulatory Visit (HOSPITAL_COMMUNITY)
Admission: RE | Admit: 2020-04-07 | Discharge: 2020-04-07 | Disposition: A | Discharge status: Home / Self Care | Payer: Commercial Managed Care - PPO | Attending: Internal Medicine | Admitting: Internal Medicine

## 2020-04-07 ENCOUNTER — Ambulatory Visit (HOSPITAL_BASED_OUTPATIENT_CLINIC_OR_DEPARTMENT_OTHER): Payer: Commercial Managed Care - PPO

## 2020-04-07 ENCOUNTER — Encounter (HOSPITAL_COMMUNITY)
Admission: RE | Disposition: A | Discharge status: Home / Self Care | Payer: Commercial Managed Care - PPO | Source: Home / Self Care | Attending: Internal Medicine

## 2020-04-07 ENCOUNTER — Other Ambulatory Visit: Payer: Self-pay

## 2020-04-07 DIAGNOSIS — Z7901 Long term (current) use of anticoagulants: Secondary | ICD-10-CM | POA: Insufficient documentation

## 2020-04-07 DIAGNOSIS — I4819 Other persistent atrial fibrillation: Secondary | ICD-10-CM | POA: Diagnosis not present

## 2020-04-07 DIAGNOSIS — I4891 Unspecified atrial fibrillation: Secondary | ICD-10-CM

## 2020-04-07 DIAGNOSIS — Z87891 Personal history of nicotine dependence: Secondary | ICD-10-CM | POA: Insufficient documentation

## 2020-04-07 DIAGNOSIS — I5042 Chronic combined systolic (congestive) and diastolic (congestive) heart failure: Secondary | ICD-10-CM | POA: Insufficient documentation

## 2020-04-07 DIAGNOSIS — Z79899 Other long term (current) drug therapy: Secondary | ICD-10-CM | POA: Diagnosis not present

## 2020-04-07 DIAGNOSIS — I340 Nonrheumatic mitral (valve) insufficiency: Secondary | ICD-10-CM | POA: Diagnosis not present

## 2020-04-07 DIAGNOSIS — Z905 Acquired absence of kidney: Secondary | ICD-10-CM | POA: Diagnosis not present

## 2020-04-07 DIAGNOSIS — N183 Chronic kidney disease, stage 3 unspecified: Secondary | ICD-10-CM | POA: Insufficient documentation

## 2020-04-07 DIAGNOSIS — I513 Intracardiac thrombosis, not elsewhere classified: Secondary | ICD-10-CM | POA: Insufficient documentation

## 2020-04-07 DIAGNOSIS — I428 Other cardiomyopathies: Secondary | ICD-10-CM | POA: Diagnosis not present

## 2020-04-07 DIAGNOSIS — Z85528 Personal history of other malignant neoplasm of kidney: Secondary | ICD-10-CM | POA: Diagnosis not present

## 2020-04-07 DIAGNOSIS — E1122 Type 2 diabetes mellitus with diabetic chronic kidney disease: Secondary | ICD-10-CM | POA: Diagnosis not present

## 2020-04-07 HISTORY — PX: ATRIAL FIBRILLATION ABLATION: EP1191

## 2020-04-07 LAB — CBC
HCT: 40.2 % (ref 39.0–52.0)
Hemoglobin: 12.9 g/dL — ABNORMAL LOW (ref 13.0–17.0)
MCH: 30.5 pg (ref 26.0–34.0)
MCHC: 32.1 g/dL (ref 30.0–36.0)
MCV: 95 fL (ref 80.0–100.0)
Platelets: 182 10*3/uL (ref 150–400)
RBC: 4.23 MIL/uL (ref 4.22–5.81)
RDW: 15.2 % (ref 11.5–15.5)
WBC: 7.3 10*3/uL (ref 4.0–10.5)
nRBC: 0 % (ref 0.0–0.2)

## 2020-04-07 LAB — GLUCOSE, CAPILLARY
Glucose-Capillary: 134 mg/dL — ABNORMAL HIGH (ref 70–99)
Glucose-Capillary: 105 mg/dL — ABNORMAL HIGH (ref 70–99)

## 2020-04-07 LAB — ECHO TEE
Height: 73 in
Weight: 3744 oz

## 2020-04-07 SURGERY — ATRIAL FIBRILLATION ABLATION
Anesthesia: General

## 2020-04-07 MED ORDER — FENTANYL CITRATE (PF) 100 MCG/2ML IJ SOLN
INTRAMUSCULAR | Status: DC | PRN
  Administered 2020-04-07 (×2): 50 ug via INTRAVENOUS

## 2020-04-07 MED ORDER — LIDOCAINE 2% (20 MG/ML) 5 ML SYRINGE
INTRAMUSCULAR | Status: DC | PRN
  Administered 2020-04-07: 40 mg via INTRAVENOUS

## 2020-04-07 MED ORDER — ONDANSETRON HCL 4 MG/2ML IJ SOLN
INTRAMUSCULAR | Status: DC | PRN
  Administered 2020-04-07: 4 mg via INTRAVENOUS

## 2020-04-07 MED ORDER — MIDAZOLAM HCL 5 MG/5ML IJ SOLN
INTRAMUSCULAR | Status: DC | PRN
  Administered 2020-04-07: 2 mg via INTRAVENOUS

## 2020-04-07 MED ORDER — SUGAMMADEX SODIUM 200 MG/2ML IV SOLN
INTRAVENOUS | Status: DC | PRN
Start: 2020-04-07 — End: 2020-04-07
  Administered 2020-04-07: 100 mg via INTRAVENOUS

## 2020-04-07 MED ORDER — APIXABAN 5 MG PO TABS
5.0000 mg | ORAL_TABLET | ORAL | Status: AC
  Administered 2020-04-07: 5 mg via ORAL
  Filled 2020-04-07: qty 1

## 2020-04-07 MED ORDER — ROCURONIUM BROMIDE 10 MG/ML (PF) SYRINGE
PREFILLED_SYRINGE | INTRAVENOUS | Status: DC | PRN
  Administered 2020-04-07: 10 mg via INTRAVENOUS

## 2020-04-07 MED ORDER — DEXAMETHASONE SODIUM PHOSPHATE 10 MG/ML IJ SOLN
INTRAMUSCULAR | Status: DC | PRN
  Administered 2020-04-07: 4 mg via INTRAVENOUS

## 2020-04-07 MED ORDER — PROPOFOL 10 MG/ML IV BOLUS
INTRAVENOUS | Status: DC | PRN
  Administered 2020-04-07: 150 mg via INTRAVENOUS

## 2020-04-07 MED ORDER — CARVEDILOL 6.25 MG PO TABS: 12.5000 mg | ORAL_TABLET | Freq: Two times a day (BID) | ORAL | 6 refills | Status: DC

## 2020-04-07 MED ORDER — SODIUM CHLORIDE 0.9 % IV SOLN: INTRAVENOUS | Status: DC

## 2020-04-07 MED ORDER — SUCCINYLCHOLINE CHLORIDE 200 MG/10ML IV SOSY
PREFILLED_SYRINGE | INTRAVENOUS | Status: DC | PRN
  Administered 2020-04-07: 200 mg via INTRAVENOUS

## 2020-04-07 SURGICAL SUPPLY — 3 items
BLANKET WARM UNDERBOD FULL ACC (MISCELLANEOUS) ×2 IMPLANT
PAD PRO RADIOLUCENT 2001M-C (PAD) ×2 IMPLANT
PATCH CARTO3 (PAD) ×2 IMPLANT

## 2020-04-07 NOTE — Progress Notes (Signed)
Discharge instructions reviewed with patient and family. Verbalized understanding. 

## 2020-04-07 NOTE — Discharge Instructions (Signed)
General Anesthesia, Adult, Care After This sheet gives you information about how to care for yourself after your procedure. Your health care provider may also give you more specific instructions. If you have problems or questions, contact your health care provider. What can I expect after the procedure? After the procedure, the following side effects are common:  Pain or discomfort at the IV site.  Nausea.  Vomiting.  Sore throat.  Trouble concentrating.  Feeling cold or chills.  Weak or tired.  Sleepiness and fatigue.  Soreness and body aches. These side effects can affect parts of the body that were not involved in surgery. Follow these instructions at home:  For at least 24 hours after the procedure:  Have a responsible adult stay with you. It is important to have someone help care for you until you are awake and alert.  Rest as needed.  Do not: ? Participate in activities in which you could fall or become injured. ? Drive. ? Use heavy machinery. ? Drink alcohol. ? Take sleeping pills or medicines that cause drowsiness. ? Make important decisions or sign legal documents. ? Take care of children on your own. Eating and drinking  Follow any instructions from your health care provider about eating or drinking restrictions.  When you feel hungry, start by eating small amounts of foods that are soft and easy to digest (bland), such as toast. Gradually return to your regular diet.  Drink enough fluid to keep your urine pale yellow.  If you vomit, rehydrate by drinking water, juice, or clear broth. General instructions  If you have sleep apnea, surgery and certain medicines can increase your risk for breathing problems. Follow instructions from your health care provider about wearing your sleep device: ? Anytime you are sleeping, including during daytime naps. ? While taking prescription pain medicines, sleeping medicines, or medicines that make you drowsy.  Return to  your normal activities as told by your health care provider. Ask your health care provider what activities are safe for you.  Take over-the-counter and prescription medicines only as told by your health care provider.  If you smoke, do not smoke without supervision.  Keep all follow-up visits as told by your health care provider. This is important. Contact a health care provider if:  You have nausea or vomiting that does not get better with medicine.  You cannot eat or drink without vomiting.  You have pain that does not get better with medicine.  You are unable to pass urine.  You develop a skin rash.  You have a fever.  You have redness around your IV site that gets worse. Get help right away if:  You have difficulty breathing.  You have chest pain.  You have blood in your urine or stool, or you vomit blood. Summary  After the procedure, it is common to have a sore throat or nausea. It is also common to feel tired.  Have a responsible adult stay with you for the first 24 hours after general anesthesia. It is important to have someone help care for you until you are awake and alert.  When you feel hungry, start by eating small amounts of foods that are soft and easy to digest (bland), such as toast. Gradually return to your regular diet.  Drink enough fluid to keep your urine pale yellow.  Return to your normal activities as told by your health care provider. Ask your health care provider what activities are safe for you. This information is not   intended to replace advice given to you by your health care provider. Make sure you discuss any questions you have with your health care provider. Document Revised: 09/27/2017 Document Reviewed: 05/10/2017 Elsevier Patient Education  2020 [employer]  

## 2020-04-07 NOTE — Interval H&P Note (Signed)
History and Physical Interval Note:  04/07/2020 10:25 AM  Melvin Ramirez  has presented today for surgery, with the diagnosis of atrial fibrillation.  The various methods of treatment have been discussed with the patient and family. After consideration of risks, benefits and other options for treatment, the patient has consented to  Procedure(s): ATRIAL FIBRILLATION ABLATION (N/A) as a surgical intervention.  The patient's history has been reviewed, patient examined, no change in status, stable for surgery.  I have reviewed the patient's chart and labs.  Questions were answered to the patient's satisfaction.    Cardiac CT reviewed with patient.  There is concern for possible LAA thrombus.  It could also be that the LAA is underfilled due to his severe LA enlargement.  He reports compliance with eliquis without interruption.  We will therefore plan TEE prior to his ablation today.  If no LAA thrombus then we will proceed.  If there is a thrombus then we will not.  Risk, benefits, and alternatives to TEE guided EP study and radiofrequency ablation for afib were discussed in detail today. These risks include but are not limited to stroke, bleeding, vascular damage, tamponade, perforation, damage to the esophagus/airway/ teeth, lungs, and other structures, pulmonary vein stenosis, worsening renal function, and death. The patient understands these risk and wishes to proceed.      [name]

## 2020-04-07 NOTE — Anesthesia Procedure Notes (Signed)
Procedure Name: Intubation Date/Time: 04/07/2020 10:57 AM Performed by: Moshe Salisbury, CRNA Pre-anesthesia Checklist: Patient identified, Emergency Drugs available, Suction available and Patient being monitored Patient Re-evaluated:Patient Re-evaluated prior to induction Oxygen Delivery Method: Circle System Utilized Preoxygenation: Pre-oxygenation with 100% oxygen Induction Type: IV induction Ventilation: Mask ventilation without difficulty Laryngoscope Size: Mac and 4 Grade View: Grade I Tube type: Oral Tube size: 8.0 mm Number of attempts: 1 Airway Equipment and Method: Stylet Placement Confirmation: ETT inserted through vocal cords under direct vision,  positive ETCO2 and breath sounds checked- equal and bilateral Secured at: 23 cm Tube secured with: Tape Dental Injury: Teeth and Oropharynx as per pre-operative assessment

## 2020-04-07 NOTE — Progress Notes (Signed)
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 I have spoken with Drs Sallyanne Kuster and Aundra Dubin.  Dr Aundra Dubin and I agree to continue eliquis at this time with reinforced importance of compliance.  Stop tikosyn and increase coreg to 12.5mg  BID.  In 4 weeks, we will repeat TEE. If V rates remain elevated, start amiodarone for rate control of his afib. Once his thrombus is resolved, he may be best served to have amiodarone followed by cardioversion.  I would defer repeated ablation until he has failed medical therapy with amiodarone.  Follow-up in the AF clinic in 2 weeks and with Dr Aundra Dubin as scheduled  [name] MD, [place] 04/07/2020 12:44 PM

## 2020-04-07 NOTE — Anesthesia Preprocedure Evaluation (Addendum)
Anesthesia Evaluation  Patient identified by MRN, date of birth, ID band Patient awake    Reviewed: Allergy & Precautions, NPO status , Patient's Chart, lab work & pertinent test results, reviewed documented beta blocker date and time   Airway Mallampati: II  TM Distance: >3 FB Neck ROM: Full    Dental  (+) Edentulous Upper, Edentulous Lower, Upper Dentures   Pulmonary COPD, former smoker,    Pulmonary exam normal breath sounds clear to auscultation       Cardiovascular hypertension, Pt. on medications and Pt. on home beta blockers +CHF  Normal cardiovascular exam+ dysrhythmias Atrial Fibrillation  Rhythm:Irregular Rate:Normal  TTE 2021 1. Left ventricular ejection fraction, by estimation, is 40 to 45%. Left ventricular ejection fraction by 2D MOD biplane is 40.7 %. The left ventricle has mildly decreased function. The left ventricle demonstrates global hypokinesis. Left ventricular diastolic parameters are indeterminate.  2. Right ventricular systolic function is normal. The right ventricular size is mildly enlarged. There is mildly elevated pulmonary artery systolic pressure. The estimated right ventricular systolic pressure is 66.4 mmHg.  3. Left atrial size was mildly dilated.  4. Right atrial size was moderately dilated.  5. The mitral valve is normal in structure. No evidence of mitral valve regurgitation.  6. The aortic valve is tricuspid. Aortic valve regurgitation is not visualized. No aortic stenosis is present.  7. Aortic dilatation noted. There is mild dilatation of the ascending aorta measuring 38 mm.  8. The inferior vena cava is dilated in size with <50% respiratory variability, suggesting right atrial pressure of 15 mmHg.    Neuro/Psych negative neurological ROS  negative psych ROS   GI/Hepatic Neg liver ROS, PUD, GERD  Controlled and Medicated,  Endo/Other  negative endocrine ROSdiabetes  Renal/GU Renal  InsufficiencyRenal disease  negative genitourinary   Musculoskeletal negative musculoskeletal ROS (+)   Abdominal   Peds  Hematology  (+) Blood dyscrasia (on eliquis), ,   Anesthesia Other Findings   Reproductive/Obstetrics                            Anesthesia Physical Anesthesia Plan  ASA: III  Anesthesia Plan: General   Post-op Pain Management:    Induction: Intravenous  PONV Risk Score and Plan: 2 and Midazolam, Dexamethasone and Ondansetron  Airway Management Planned: Oral ETT  Additional Equipment:   Intra-op Plan:   Post-operative Plan: Extubation in OR  Informed Consent: I have reviewed the patients History and Physical, chart, labs and discussed the procedure including the risks, benefits and alternatives for the proposed anesthesia with the patient or authorized representative who has indicated his/her understanding and acceptance.     Dental advisory given  Plan Discussed with: CRNA  Anesthesia Plan Comments:         Anesthesia Quick Evaluation

## 2020-04-07 NOTE — Progress Notes (Signed)
  Echocardiogram Echocardiogram Transesophageal has been performed.  Melvin Ramirez 04/07/2020, 12:12 PM

## 2020-04-07 NOTE — Transfer of Care (Signed)
Immediate Anesthesia Transfer of Care Note  Patient: Melvin Ramirez  Procedure(s) Performed: ATRIAL FIBRILLATION ABLATION (N/A )  Patient Location: Cath Lab  Anesthesia Type:General  Level of Consciousness: drowsy and patient cooperative  Airway & Oxygen Therapy: Patient Spontanous Breathing and Patient connected to nasal cannula oxygen  Post-op Assessment: Report given to RN, Post -op Vital signs reviewed and stable and Patient moving all extremities  Post vital signs: Reviewed and stable  Last Vitals:  Vitals Value Taken Time  BP    Temp    Pulse 50 04/07/20 1127  Resp    SpO2 95 % 04/07/20 1127  Vitals shown include unvalidated device data.  Last Pain:  Vitals:   04/07/20 1128  TempSrc:   PainSc: Asleep         Complications: No complications documented.

## 2020-04-07 NOTE — Procedures (Signed)
INDICATIONS: AFib PRE-ABLATION  PROCEDURE:   Informed consent was obtained prior to the procedure. The risks, benefits and alternatives for the procedure were discussed and the patient comprehended these risks.  Risks include, but are not limited to, cough, sore throat, vomiting, nausea, somnolence, esophageal and stomach trauma or perforation, bleeding, low blood pressure, aspiration, pneumonia, infection, trauma to the teeth and death.    During this procedure the patient under GETA per Anesthesiology.  The transesophageal probe was inserted in the esophagus and stomach without difficulty and multiple views were obtained.  The patient was kept under observation until the patient left the procedure room.  The patient left the procedure room in stable condition.   Agitated microbubble saline contrast was not administered.  COMPLICATIONS:    There were no immediate complications.  FINDINGS:  The left atrium is dilated. There is severe spontaneous echo contrast in the left atrial appendage and there is a soft, fresh-appearing thrombus in the tip of the appendage. The eft ventricle appears to be moderately depressed.  No major valvular abnormalities  RECOMMENDATIONS:     Delay ablation until resolution of left atrial appendage thrombus. Dr. Rayann Heman reviewed the images in the procedure room.  Time Spent Directly with the Patient:  20 minutes   Nichole Keltner 04/07/2020, 11:29 AM

## 2020-04-08 NOTE — Anesthesia Postprocedure Evaluation (Signed)
Anesthesia Post Note  Patient: Melvin Ramirez  Procedure(s) Performed: ATRIAL FIBRILLATION ABLATION (N/A )     Patient location during evaluation: PACU Anesthesia Type: General Level of consciousness: awake and alert Pain management: pain level controlled Vital Signs Assessment: post-procedure vital signs reviewed and stable Respiratory status: spontaneous breathing, nonlabored ventilation, respiratory function stable and patient connected to nasal cannula oxygen Cardiovascular status: blood pressure returned to baseline and stable Postop Assessment: no apparent nausea or vomiting Anesthetic complications: no   No complications documented.  Last Vitals:  Vitals:   04/07/20 1151 04/07/20 1155  BP:  97/65  Pulse:  (!) 52  Resp:  14  Temp: 36.4 C   SpO2:  94%    Last Pain:  Vitals:   04/07/20 1151  TempSrc: Temporal  PainSc: 0-No pain                 Valbona Slabach L Jeromiah Ohalloran

## 2020-04-19 ENCOUNTER — Other Ambulatory Visit: Payer: Self-pay

## 2020-04-19 ENCOUNTER — Ambulatory Visit: Payer: Commercial Managed Care - PPO | Admitting: Cardiovascular Disease

## 2020-04-19 ENCOUNTER — Encounter (HOSPITAL_COMMUNITY): Payer: Self-pay | Admitting: Physician Assistant

## 2020-04-19 ENCOUNTER — Ambulatory Visit (HOSPITAL_COMMUNITY)
Admission: RE | Admit: 2020-04-19 | Discharge: 2020-04-19 | Disposition: A | Discharge status: Home / Self Care | Payer: Commercial Managed Care - PPO | Source: Ambulatory Visit | Attending: Physician Assistant | Admitting: Physician Assistant

## 2020-04-19 VITALS — BP 90/60 | HR 84 | Ht 73.0 in | Wt 249.2 lb

## 2020-04-19 DIAGNOSIS — E669 Obesity, unspecified: Secondary | ICD-10-CM | POA: Insufficient documentation

## 2020-04-19 DIAGNOSIS — I5022 Chronic systolic (congestive) heart failure: Secondary | ICD-10-CM | POA: Insufficient documentation

## 2020-04-19 DIAGNOSIS — Z79899 Other long term (current) drug therapy: Secondary | ICD-10-CM | POA: Insufficient documentation

## 2020-04-19 DIAGNOSIS — I130 Hypertensive heart and chronic kidney disease with heart failure and stage 1 through stage 4 chronic kidney disease, or unspecified chronic kidney disease: Secondary | ICD-10-CM | POA: Diagnosis not present

## 2020-04-19 DIAGNOSIS — Z905 Acquired absence of kidney: Secondary | ICD-10-CM | POA: Diagnosis not present

## 2020-04-19 DIAGNOSIS — Z884 Allergy status to anesthetic agent status: Secondary | ICD-10-CM | POA: Insufficient documentation

## 2020-04-19 DIAGNOSIS — Z7901 Long term (current) use of anticoagulants: Secondary | ICD-10-CM | POA: Insufficient documentation

## 2020-04-19 DIAGNOSIS — Z6832 Body mass index (BMI) 32.0-32.9, adult: Secondary | ICD-10-CM | POA: Insufficient documentation

## 2020-04-19 DIAGNOSIS — I4819 Other persistent atrial fibrillation: Secondary | ICD-10-CM | POA: Insufficient documentation

## 2020-04-19 DIAGNOSIS — Z87891 Personal history of nicotine dependence: Secondary | ICD-10-CM | POA: Insufficient documentation

## 2020-04-19 DIAGNOSIS — E1122 Type 2 diabetes mellitus with diabetic chronic kidney disease: Secondary | ICD-10-CM | POA: Insufficient documentation

## 2020-04-19 DIAGNOSIS — Z886 Allergy status to analgesic agent status: Secondary | ICD-10-CM | POA: Insufficient documentation

## 2020-04-19 DIAGNOSIS — Z8249 Family history of ischemic heart disease and other diseases of the circulatory system: Secondary | ICD-10-CM | POA: Insufficient documentation

## 2020-04-19 DIAGNOSIS — D6869 Other thrombophilia: Secondary | ICD-10-CM

## 2020-04-19 DIAGNOSIS — Z8616 Personal history of COVID-19: Secondary | ICD-10-CM | POA: Insufficient documentation

## 2020-04-19 DIAGNOSIS — N183 Chronic kidney disease, stage 3 unspecified: Secondary | ICD-10-CM | POA: Insufficient documentation

## 2020-04-19 NOTE — Progress Notes (Signed)
Primary Care Physician: [name], MD Primary Cardiologist: Dr Oval Linsey Primary Electrophysiologist: Dr Rayann Heman Referring Physician: Dr Oval Linsey [facility]: Dr Dara Hoyer is a 74 y.o. male with a history of persistent atrial fibrillation, chronic systolic and diastolic heart failure, DM, CKD 3, RCC s/p nephrectomy, and obesity who presents for follow up in the [facility].  He was admitted 11/2019 with GI bleeding and atrial fibrillation in the setting of recent COVID pneumonia.  He had COVID pneumonia approximately 1 month prior to admission.  He subsequently developed palpitations and cough.  His heart rate was in the 160s and he was found to be in atrial fibrillation.  Thyroid function was normal.  He was started on anticoagulation and developed bleeding gastric ulcers that required clipping.  He was found to be positive for H. Pylori.  He was subsequently started on Eliquis for a CHADS2VASC score of 3.  Echo that admission revealed LVEF 25 to 30% with global hypokinesis.  Hypokinesis was worse than the anterior myocardium. He underwent successful DCCV on 12/17/19. Unfortunately, he had ERAF after DCCV. Repeat echo showed EF 30-35%. Patient is unaware of his arrhythmia.  Patient was loaded on dofetilide 02/2020 but unfortunately had early return of his afib and underwent DCCV again on 02/26/20. 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   On follow up today, patient reports that he feels about the same as his last visit. He denies any symptoms of his arrhythmia. Echo 03/2020 showed improved EF 40-45%. He reports that on his home scale he has gained only 4 lbs over several weeks and denies increased SOB, PND, or orthopnea. He does have increased lower extremity edema.   Today, he denies symptoms of palpitations, chest pain, shortness of breath, orthopnea, PND,  dizziness, presyncope, syncope, snoring, daytime somnolence, bleeding, or neurologic sequela. The patient is tolerating medications without difficulties and is otherwise without complaint today.    Atrial Fibrillation Risk Factors:  he does not have symptoms or diagnosis of sleep apnea. he does not have a history of rheumatic fever.   he has a BMI of Body mass index is 32.88 kg/m.Marland Kitchen Filed Weights   04/19/20 1529  Weight: 113 kg    Family History  Problem Relation Age of Onset  . Cancer Mother   . Heart attack Father   . Heart attack Brother   . Heart disease Sister   . Stroke Paternal Grandmother      Atrial Fibrillation Management history:  Previous antiarrhythmic drugs: dofetilide Previous cardioversions: 12/17/19, 02/18/20, 02/26/20 Previous ablations: none CHADS2VASC score: 3 Anticoagulation history: Eliquis    Past Medical History:  Diagnosis Date  . Chronic combined systolic and diastolic heart failure ([place]) 12/13/2019  . Chronic kidney disease   . CKD (chronic kidney disease) stage 3, GFR 30-59 ml/min 12/13/2019  . Diabetes mellitus ([place])   . Gastric ulcer 12/13/2019   +H. Pylori. Bleeding required clipping.  . H/O blood clots   . History of kidney cancer   . Persistent atrial fibrillation ([place]) 12/13/2019  . Renal cell carcinoma ([place]) 12/13/2019   S/p nephrectomy   Past Surgical History:  Procedure Laterality Date  . APPENDECTOMY    . ATRIAL FIBRILLATION ABLATION N/A 04/07/2020   Procedure: ATRIAL FIBRILLATION ABLATION;  Surgeon: [name], MD;  Location: [facility];  Service: Cardiovascular;  Laterality: N/A;  .  BIOPSY  11/19/2019   Procedure: BIOPSY;  Surgeon: Ronald Lobo, MD;  Location: [place];  Service: Endoscopy;;  . CARDIOVERSION N/A 12/17/2019   Procedure: CARDIOVERSION;  Surgeon: Geralynn Rile, MD;  Location: [facility];  Service: Cardiovascular;  Laterality: N/A;  . CARDIOVERSION N/A 02/18/2020   Procedure: CARDIOVERSION;   Surgeon: Jerline Pain, MD;  Location: [facility] ENDOSCOPY;  Service: Cardiovascular;  Laterality: N/A;  . CARDIOVERSION N/A 02/29/2020   Procedure: CARDIOVERSION;  Surgeon: Larey Dresser, MD;  Location: [facility] ENDOSCOPY;  Service: Cardiovascular;  Laterality: N/A;  . ESOPHAGOGASTRODUODENOSCOPY N/A 11/19/2019   Procedure: ESOPHAGOGASTRODUODENOSCOPY (EGD);  Surgeon: Ronald Lobo, MD;  Location: [facility] ENDOSCOPY;  Service: Endoscopy;  Laterality: N/A;  . HEMOSTASIS CLIP PLACEMENT  11/19/2019   Procedure: HEMOSTASIS CLIP PLACEMENT;  Surgeon: Ronald Lobo, MD;  Location: [place];  Service: Endoscopy;;  . NEPHRECTOMY      Current Outpatient Medications  Medication Sig Dispense Refill  . ACCU-CHEK GUIDE test strip daily. for testing    . Accu-Chek Softclix Lancets lancets     . acetaminophen (TYLENOL) 500 MG tablet Take 1,000 mg by mouth every 6 (six) hours as needed for mild pain or headache.    Marland Kitchen apixaban (ELIQUIS) 5 MG TABS tablet Take 1 tablet (5 mg total) by mouth 2 (two) times daily. 60 tablet 2  . ascorbic acid (V-R VITAMIN C) 500 MG tablet Take 500 mg by mouth every evening.    Marland Kitchen atorvastatin (LIPITOR) 10 MG tablet Take 10 mg by mouth daily.    . Blood Glucose Monitoring Suppl (ACCU-CHEK GUIDE ME) w/Device KIT See admin instructions.    . carvedilol (COREG) 6.25 MG tablet Take 2 tablets (12.5 mg total) by mouth 2 (two) times daily with a meal. 60 tablet 6  . digoxin (LANOXIN) 0.125 MG tablet Take 0.125 mg by mouth daily.     . Garlic 4709 MG CAPS Take 1,000 mg by mouth every evening.    . Glucosamine HCl 1000 MG TABS Take 1,000 mg by mouth every evening.    . Multiple Vitamin (MULTIVITAMIN WITH MINERALS) TABS tablet Take 1 tablet by mouth daily with breakfast.     . Omega-3 Fatty Acids (FISH OIL) 1000 MG CAPS Take 1,000 mg by mouth every evening.     . pantoprazole (PROTONIX) 40 MG tablet Take 1 tablet (40 mg total) by mouth 2 (two) times daily. 60 tablet 3  . sacubitril-valsartan (ENTRESTO)  24-26 MG Take 1 tablet by mouth 2 (two) times daily. 60 tablet 5  . Saw Palmetto 450 MG CAPS Take 450 mg by mouth every evening.     No current facility-administered medications for this encounter.    Allergies  Allergen Reactions  . Aspirin Other (See Comments)    Bruises easily   . Novocain [Procaine] Other (See Comments)    Syncope (passed out)     Social History   Socioeconomic History  . Marital status: Married    Spouse name: Not on file  . Number of children: Not on file  . Years of education: Not on file  . Highest education level: Not on file  Occupational History  . Occupation: truck Geophysicist/field seismologist  Tobacco Use  . Smoking status: Former Smoker    Packs/day: 2.00    Years: 35.00    Pack years: 70.00    Types: Cigarettes    Quit date: 2000    Years since quitting: 21.5  . Smokeless tobacco: Never Used  Vaping Use  . Vaping Use:  Never used  Substance and Sexual Activity  . Alcohol use: Yes    Alcohol/week: 1.0 - 2.0 standard drink    Types: 1 - 2 Cans of beer per week    Comment: Socially  . Drug use: No  . Sexual activity: Not on file  Other Topics Concern  . Not on file  Social History Narrative   Lives with wife in a one story home.  Has 3 children.     Works as a Administrator.     Education: 12th grade.   Social Determinants of Health   Financial Resource Strain:   . Difficulty of Paying Living Expenses:   Food Insecurity:   . Worried About Charity fundraiser in the Last Year:   . Arboriculturist in the Last Year:   Transportation Needs:   . Film/video editor (Medical):   Marland Kitchen Lack of Transportation (Non-Medical):   Physical Activity:   . Days of Exercise per Week:   . Minutes of Exercise per Session:   Stress:   . Feeling of Stress :   Social Connections:   . Frequency of Communication with Friends and Family:   . Frequency of Social Gatherings with Friends and Family:   . Attends Religious Services:   . Active Member of Clubs or  Organizations:   . Attends Archivist Meetings:   Marland Kitchen Marital Status:   Intimate Partner Violence:   . Fear of Current or Ex-Partner:   . Emotionally Abused:   Marland Kitchen Physically Abused:   . Sexually Abused:      ROS- All systems are reviewed and negative except as per the HPI above.  Physical Exam: Vitals:   04/19/20 1529  BP: 90/60  Pulse: 84  Weight: 113 kg  Height: 6' 1"  (1.854 m)   GEN- The patient is well appearing obese male, alert and oriented x 3 today.   HEENT-head normocephalic, atraumatic, sclera clear, conjunctiva pink, hearing intact, trachea midline. Lungs- Clear to ausculation bilaterally, normal work of breathing Heart- irregular rate and rhythm, no murmurs, rubs or gallops  GI- soft, NT, ND, + BS Extremities- no clubbing, cyanosis. 1+ bilateral edema MS- no significant deformity or atrophy Skin- no rash or lesion Psych- euthymic mood, full affect Neuro- strength and sensation are intact   Wt Readings from Last 3 Encounters:  04/19/20 113 kg  04/07/20 106.1 kg  04/01/20 106.1 kg    EKG today demonstrates afib HR 84, PVCs, QRS 102, QTc 451  Echo 03/21/20 demonstrated  1. Left ventricular ejection fraction, by estimation, is 40 to 45%. Left  ventricular ejection fraction by 2D MOD biplane is 40.7 %. The left  ventricle has mildly decreased function. The left ventricle demonstrates  global hypokinesis. Left ventricular  diastolic parameters are indeterminate.  2. Right ventricular systolic function is normal. The right ventricular  size is mildly enlarged. There is mildly elevated pulmonary artery  systolic pressure. The estimated right ventricular systolic pressure is  77.8 mmHg.  3. Left atrial size was mildly dilated.  4. Right atrial size was moderately dilated.  5. The mitral valve is normal in structure. No evidence of mitral valve  regurgitation.  6. The aortic valve is tricuspid. Aortic valve regurgitation is not  visualized. No  aortic stenosis is present.  7. Aortic dilatation noted. There is mild dilatation of the ascending  aorta measuring 38 mm.  8. The inferior vena cava is dilated in size with <50% respiratory  variability, suggesting  right atrial pressure of 15 mmHg.   Epic records are reviewed at length today  CHA2DS2-VASc Score = 3 The patient's score is based upon: CHF History: Yes HTN History: No Age : 45-74 Diabetes History: Yes Stroke History: No Vascular Disease History: No Gender: Male   ASSESSMENT AND PLAN: 1. Persistent Atrial Fibrillation (ICD10:  I48.19) The patient's CHA2DS2-VASc score is 3, indicating a 3.2% annual risk of stroke.   Patient in rate controlled afib.  Continue Coreg 12.5 mg BID Continue digoxin 0.125 mg daily Continue Eliquis 5 mg BID. Stressed importance of compliance given LAA thrombus.  Plan for repeat TEE after 4 weeks (7/29) followed by amiodarone loading. Per Dr Rayann Heman could consider ablation if he fails amiodarone.   2. Secondary Hypercoagulable State (ICD10:  D68.69) The patient is at significant risk for stroke/thromboembolism based upon his CHA2DS2-VASc Score of 3.  Continue Apixaban (Eliquis).   3. Chronic systolic CHF EF improved to 40-45% with rate control.  Patient followed by Dr Aundra Dubin. Patient's weight is up on our scale today. He reports minimal weight gain on his home scale. He admits he drinks a lot of fluid during the day. He has torsemide pills at home for PRN use. Instructed him to take for 2 days and follow nephrology recommendations for fluid intake. Strict precautions given.   Follow up with Dr Aundra Dubin and Dr Rayann Heman as scheduled.    [facility] [address] [place],  [address] [phone] 04/19/2020 4:24 PM

## 2020-04-25 ENCOUNTER — Encounter (HOSPITAL_COMMUNITY): Payer: Self-pay | Admitting: Internal Medicine

## 2020-04-29 ENCOUNTER — Other Ambulatory Visit: Payer: Self-pay

## 2020-04-29 ENCOUNTER — Encounter (HOSPITAL_COMMUNITY): Payer: Self-pay | Admitting: Cardiology

## 2020-04-29 ENCOUNTER — Ambulatory Visit (HOSPITAL_COMMUNITY)
Admission: RE | Admit: 2020-04-29 | Discharge: 2020-04-29 | Disposition: A | Discharge status: Home / Self Care | Payer: Commercial Managed Care - PPO | Source: Ambulatory Visit | Attending: Cardiology | Admitting: Cardiology

## 2020-04-29 ENCOUNTER — Other Ambulatory Visit (HOSPITAL_COMMUNITY): Payer: Self-pay | Admitting: *Deleted

## 2020-04-29 VITALS — BP 102/64 | HR 68 | Wt 245.0 lb

## 2020-04-29 DIAGNOSIS — Z85528 Personal history of other malignant neoplasm of kidney: Secondary | ICD-10-CM | POA: Diagnosis not present

## 2020-04-29 DIAGNOSIS — Z7984 Long term (current) use of oral hypoglycemic drugs: Secondary | ICD-10-CM | POA: Insufficient documentation

## 2020-04-29 DIAGNOSIS — Z8249 Family history of ischemic heart disease and other diseases of the circulatory system: Secondary | ICD-10-CM | POA: Insufficient documentation

## 2020-04-29 DIAGNOSIS — Z87891 Personal history of nicotine dependence: Secondary | ICD-10-CM | POA: Diagnosis not present

## 2020-04-29 DIAGNOSIS — I5022 Chronic systolic (congestive) heart failure: Secondary | ICD-10-CM | POA: Insufficient documentation

## 2020-04-29 DIAGNOSIS — Z86718 Personal history of other venous thrombosis and embolism: Secondary | ICD-10-CM | POA: Diagnosis not present

## 2020-04-29 DIAGNOSIS — Z8616 Personal history of COVID-19: Secondary | ICD-10-CM | POA: Diagnosis not present

## 2020-04-29 DIAGNOSIS — Z79899 Other long term (current) drug therapy: Secondary | ICD-10-CM | POA: Insufficient documentation

## 2020-04-29 DIAGNOSIS — E1140 Type 2 diabetes mellitus with diabetic neuropathy, unspecified: Secondary | ICD-10-CM | POA: Insufficient documentation

## 2020-04-29 DIAGNOSIS — I4819 Other persistent atrial fibrillation: Secondary | ICD-10-CM

## 2020-04-29 DIAGNOSIS — Z8711 Personal history of peptic ulcer disease: Secondary | ICD-10-CM | POA: Diagnosis not present

## 2020-04-29 DIAGNOSIS — E785 Hyperlipidemia, unspecified: Secondary | ICD-10-CM | POA: Insufficient documentation

## 2020-04-29 DIAGNOSIS — I480 Paroxysmal atrial fibrillation: Secondary | ICD-10-CM | POA: Diagnosis not present

## 2020-04-29 DIAGNOSIS — I5042 Chronic combined systolic (congestive) and diastolic (congestive) heart failure: Secondary | ICD-10-CM

## 2020-04-29 DIAGNOSIS — E1122 Type 2 diabetes mellitus with diabetic chronic kidney disease: Secondary | ICD-10-CM | POA: Insufficient documentation

## 2020-04-29 DIAGNOSIS — I513 Intracardiac thrombosis, not elsewhere classified: Secondary | ICD-10-CM

## 2020-04-29 DIAGNOSIS — Z7901 Long term (current) use of anticoagulants: Secondary | ICD-10-CM | POA: Insufficient documentation

## 2020-04-29 DIAGNOSIS — N183 Chronic kidney disease, stage 3 unspecified: Secondary | ICD-10-CM | POA: Insufficient documentation

## 2020-04-29 LAB — BASIC METABOLIC PANEL
Anion gap: 8 (ref 5–15)
BUN: 21 mg/dL (ref 8–23)
CO2: 24 mmol/L (ref 22–32)
Calcium: 9 mg/dL (ref 8.9–10.3)
Chloride: 106 mmol/L (ref 98–111)
Creatinine, Ser: 1.79 mg/dL — ABNORMAL HIGH (ref 0.61–1.24)
GFR calc Af Amer: 43 mL/min — ABNORMAL LOW (ref >60)
GFR calc non Af Amer: 37 mL/min — ABNORMAL LOW (ref >60)
Glucose, Bld: 104 mg/dL — ABNORMAL HIGH (ref 70–99)
Potassium: 4.4 mmol/L (ref 3.5–5.1)
Sodium: 138 mmol/L (ref 135–145)

## 2020-04-29 LAB — DIGOXIN LEVEL: Digoxin Level: 0.8 ng/mL (ref 0.8–2.0)

## 2020-04-29 MED ORDER — CARVEDILOL 6.25 MG PO TABS: 12.5000 mg | ORAL_TABLET | Freq: Two times a day (BID) | ORAL | 6 refills | Status: DC

## 2020-04-29 MED ORDER — DAPAGLIFLOZIN PROPANEDIOL 10 MG PO TABS
10.0000 mg | ORAL_TABLET | Freq: Every day | ORAL | 6 refills | Status: DC
Start: 2020-04-29 — End: 2020-08-26

## 2020-04-29 MED ORDER — ATORVASTATIN CALCIUM 10 MG PO TABS: 10.0000 mg | ORAL_TABLET | Freq: Every day | ORAL | 6 refills | Status: DC

## 2020-04-29 NOTE — Patient Instructions (Signed)
Start Farxiga 10 mg Daily  Labs done today, your results will be available in MyChart, we will contact you for abnormal readings.  Your physician has requested that you have a TEE. During a TEE, sound waves are used to create images of your heart. It provides your doctor with information about the size and shape of your heart and how well your heart's chambers and valves are working. In this test, a transducer is attached to the end of a flexible tube that's guided down your throat and into your esophagus (the tube leading from you mouth to your stomach) to get a more detailed image of your heart. You are not awake for the procedure. Please see the instruction sheet given to you today. For further information please visit [email or web address].  Your physician recommends that you schedule a follow-up appointment in: 1 month   TEE INSTRUCTIONS:  You are scheduled for a TEE on WED 05/18/20 with Dr. Aundra Dubin.  Please arrive at the [facility] (Main Entrance A) at [facility]: [address] [place], [place] [address] at 8:30 am   DIET: Nothing to eat or drink after midnight except a sip of water with medications (see medication instructions below)  Medication Instructions: Hold FARXIGA WED 8/11 AM  Continue your anticoagulant: ELIQUIS, DO NOT MISS ANY DOSES    COVID TEST: Monday AUG 9TH AT 10:00 AM, PLEASE GO TO:    [address] [place], [place] [address] This is a drive-through location  You must have a responsible person to drive you home and stay in the waiting area during your procedure. Failure to do so could result in cancellation.  Bring your insurance cards.  *Special Note: Every effort is made to have your procedure done on time. Occasionally there are emergencies that occur at the hospital that may cause delays. Please be patient if a delay does occur.

## 2020-05-01 NOTE — H&P (View-Only) (Signed)
PCP: [name], MD Cardiology: Dr. Oval Linsey EP: Dr. Rayann Heman HF Cardiology: Dr. Aundra Dubin  74 y.o. with history of COVID-19 PNA, atrial fibrillation, and CHF was referred by Dr. Oval Linsey for evaluation of CHF.  Patient has history of renal cell carcinoma with right nephrectomy and diabetes, has CKD stage 3 as well.  He was feeling good until 1/21, when he developed COVID-19 PNA.  He was not hospitalized, but had a severe bout with it.  In 2/21, he was admitted with GI bleeding from gastric ulcers and new atrial fibrillation. He had EGD with clipping of ulcers.  Eventually, he was startedon Eliquis and had DCCV to NSR.  He additionally was noted to have RLE DVT in 2/21.  Echo in 2/21 hospitalization showed EF 25-30%.  Lexiscan Cardiolite showed inferior scar versus artifact and no ischemia.  Repeat echo in 3/21 showed EF still 30-35%.   He had ERAF after 3/21 DCCV and was re-admitted in 5/21 for Tikosyn initiation.  He had repeat DCCV to NSR this admission.  He was only on Tikosyn 125 mcg bid at time of discharge.  He went back into atrial fibrillation again and I cardioverted him to NSR later in 5/21.   Echo in 6/21 showed improved EF 40-45%, RV normal.    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  Tikosyn was stopped, Coreg was increased to 12.5 mg bid.    He returns for followup of CHF and atrial fibrillation.  He remains in atrial fibrillation today, rate is controlled.  No dyspnea walking on flat ground, no dyspnea walking up a flight of stairs.  He is back to driving a truck.  Weight is stable.  No lightheadedness or palpitations.    ECG (personally reviewed): atrial fibrillation rate 73  Labs (5/21): K 4.5, creatinine 1.73 => 1.9, digoxin 0.9 Labs (6/21): K 5.2, creatinine 1.76  PMH: 1. Atrial fibrillation: Paroxysmal.  - DCCV to NSR in 3/21, ERAF.   - Admitted in 5/21 for Tikosyn intiation and  repeat DCCV to NSR.  - LAA thrombus in 7/21, atrial fibrillation ablation not done and Tikosyn stopped.  2. 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 Renal cell carcinoma s/p right nephrectomy 4. CKD: Stage 3. 5. Hyperlipidemia.  6. H/o COVID-19 PNA in 1/21.  7. H/o GI bleeding from gastric ulcers in 3/21.  8. Cardiomyopathy: Echo (2/21) with EF 25-30%.  - Cardiolite (2/21) with inferior scar, no ischemia.  - Echo (3/21): EF 30-35%, global hypokinesis, mild MR.  - Echo (6/21): EF 40-45%, RV normal size and systolic function, PASP 36, IVC dilated.  9. RLE DVT in 2/21 post-COVID-19 PNA.   Social History   Socioeconomic History  . Marital status: Married    Spouse name: Not on file  . Number of children: Not on file  . Years of education: Not on file  . Highest education level: Not on file  Occupational History  . Occupation: truck Geophysicist/field seismologist  Tobacco Use  . Smoking status: Former Smoker    Packs/day: 2.00    Years: 35.00    Pack years: 70.00    Types: Cigarettes    Quit date: 2000    Years since quitting: 21.5  . Smokeless tobacco: Never Used  Vaping Use  . Vaping Use: Never used  Substance and Sexual Activity  . Alcohol use: Yes    Alcohol/week: 1.0 - 2.0 standard drink  Types: 1 - 2 Cans of beer per week    Comment: Socially  . Drug use: No  . Sexual activity: Not on file  Other Topics Concern  . Not on file  Social History Narrative   Lives with wife in a one story home.  Has 3 children.     Works as a Administrator.     Education: 12th grade.   Social Determinants of Health   Financial Resource Strain:   . Difficulty of Paying Living Expenses:   Food Insecurity:   . Worried About Charity fundraiser in the Last Year:   . Arboriculturist in the Last Year:   Transportation Needs:   . Film/video editor (Medical):   Marland Kitchen Lack of Transportation (Non-Medical):   Physical Activity:   . Days of Exercise per Week:   . Minutes of Exercise per Session:    Stress:   . Feeling of Stress :   Social Connections:   . Frequency of Communication with Friends and Family:   . Frequency of Social Gatherings with Friends and Family:   . Attends Religious Services:   . Active Member of Clubs or Organizations:   . Attends Archivist Meetings:   Marland Kitchen Marital Status:   Intimate Partner Violence:   . Fear of Current or Ex-Partner:   . Emotionally Abused:   Marland Kitchen Physically Abused:   . Sexually Abused:    Family History  Problem Relation Age of Onset  . Cancer Mother   . Heart attack Father   . Heart attack Brother   . Heart disease Sister   . Stroke Paternal Grandmother    ROS: All systems reviewed and negative except as per HPI.   Current Outpatient Medications  Medication Sig Dispense Refill  . ACCU-CHEK GUIDE test strip daily. for testing    . Accu-Chek Softclix Lancets lancets     . acetaminophen (TYLENOL) 500 MG tablet Take 1,000 mg by mouth every 6 (six) hours as needed for mild pain or headache.    Marland Kitchen apixaban (ELIQUIS) 5 MG TABS tablet Take 1 tablet (5 mg total) by mouth 2 (two) times daily. 60 tablet 2  . ascorbic acid (V-R VITAMIN C) 500 MG tablet Take 500 mg by mouth every evening.    Marland Kitchen atorvastatin (LIPITOR) 10 MG tablet Take 1 tablet (10 mg total) by mouth daily. 30 tablet 6  . Blood Glucose Monitoring Suppl (ACCU-CHEK GUIDE ME) w/Device KIT See admin instructions.    . carvedilol (COREG) 6.25 MG tablet Take 2 tablets (12.5 mg total) by mouth 2 (two) times daily with a meal. 60 tablet 6  . digoxin (LANOXIN) 0.125 MG tablet Take 0.125 mg by mouth daily.     . Garlic 6301 MG CAPS Take 1,000 mg by mouth every evening.    . Glucosamine HCl 1000 MG TABS Take 1,000 mg by mouth every evening.    . Multiple Vitamin (MULTIVITAMIN WITH MINERALS) TABS tablet Take 1 tablet by mouth daily with breakfast.     . Omega-3 Fatty Acids (FISH OIL) 1000 MG CAPS Take 1,000 mg by mouth every evening.     . pantoprazole (PROTONIX) 40 MG tablet Take 1  tablet (40 mg total) by mouth 2 (two) times daily. 60 tablet 3  . sacubitril-valsartan (ENTRESTO) 24-26 MG Take 1 tablet by mouth 2 (two) times daily. 60 tablet 5  . Saw Palmetto 450 MG CAPS Take 450 mg by mouth every evening.    Marland Kitchen  dapagliflozin propanediol (FARXIGA) 10 MG TABS tablet Take 1 tablet (10 mg total) by mouth daily before breakfast. 30 tablet 6   No current facility-administered medications for this encounter.   BP (!) 102/64   Pulse 68   Wt (!) 111.1 kg (245 lb)   SpO2 98%   BMI 32.32 kg/m  General: NAD Neck: JVP 8-9 cm, no thyromegaly or thyroid nodule.  Lungs: Clear to auscultation bilaterally with normal respiratory effort. CV: Nondisplaced PMI.  Heart irregular S1/S2, no S3/S4, no murmur.  1+ ankle edema.  No carotid bruit.  Normal pedal pulses.  Abdomen: Soft, nontender, no hepatosplenomegaly, no distention.  Skin: Intact without lesions or rashes.  Neurologic: Alert and oriented x 3.  Psych: Normal affect. Extremities: No clubbing or cyanosis.  HEENT: Normal.   Assessment/Plan: 1. Chronic systolic CHF: Echo in 4/03 with EF 25-30%, echo in 3/21 with EF 30-35%.  Cardiolite with inferior scar vs artifact, no ischemia. Cause of cardiomyopathy is uncertain.  Could be related to atrial fibrillation (though not currently in RVR).  Have not ruled out CAD or cardiomyopathy related to myocarditis (?COVID-19 myocarditis).  Repeat echo in 6/21 showed EF up to 40-45%.  On exam, he is mildly volume overloaded with NYHA class II symptoms. Medication titration limited by soft blood pressure.  - Continue Coreg 6.25 mg bid.  - Continue Entresto 24/26 bid.  - I do not think that he needs a diuretic currently.  - Continue digoxin, check level today.    - Will not start spironolactone right now as K has been mildly elevated. Need to check BMET today.  - I will add dapagliflozin 10 mg daily given CHF and CKD.  BMET today and in 10 days.  - He will need eventual RHC/LHC if EF does not  return to normal after DCCV, this will be complicated by elevated creatinine.   2. Atrial fibrillation: Persistent.  He is back in atrial fibrillation despite DCCV x 2 on Tikosyn.  I would like to get him in NSR to see if this helps improve his LV function. He was unable to have atrial fibrillation ablation due to LAA thrombus and Tikosyn was stopped.   - He is now taking Eliquis 5 mg bid without missing doses.  - Plan will be TEE in about 4 wks to make sure no LA appendage thrombus, then will start amiodarone and after about 2 wks, will cardiovert.  We discussed risks/benefits of procedure and he agrees to it.  - Right now, no plan for atrial fibrillation ablation.   3. CKD: Stage 3.  BMET today.  Starting dapagliflozin which improves outcomes with CKD.   Followup 4 wks with TEE  [name] 05/01/2020

## 2020-05-01 NOTE — Progress Notes (Signed)
PCP: Swayne, David, MD Cardiology: Dr. [place] EP: Dr. Allred HF Cardiology: Dr. Brynlynn Walko  74 y.o. with history of COVID-19 PNA, atrial fibrillation, and CHF was referred by Dr. [place] for evaluation of CHF.  Patient has history of renal cell carcinoma with right nephrectomy and diabetes, has CKD stage 3 as well.  He was feeling good until 1/21, when he developed COVID-19 PNA.  He was not hospitalized, but had a severe bout with it.  In 2/21, he was admitted with GI bleeding from gastric ulcers and new atrial fibrillation. He had EGD with clipping of ulcers.  Eventually, he was startedon Eliquis and had DCCV to NSR.  He additionally was noted to have RLE DVT in 2/21.  Echo in 2/21 hospitalization showed EF 25-30%.  Lexiscan Cardiolite showed inferior scar versus artifact and no ischemia.  Repeat echo in 3/21 showed EF still 30-35%.   He had ERAF after 3/21 DCCV and was re-admitted in 5/21 for Tikosyn initiation.  He had repeat DCCV to NSR this admission.  He was only on Tikosyn 125 mcg bid at time of discharge.  He went back into atrial fibrillation again and I cardioverted him to NSR later in 5/21.   Echo in 6/21 showed improved EF 40-45%, RV normal.    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  Tikosyn was stopped, Coreg was increased to 12.5 mg bid.    He returns for followup of CHF and atrial fibrillation.  He remains in atrial fibrillation today, rate is controlled.  No dyspnea walking on flat ground, no dyspnea walking up a flight of stairs.  He is back to driving a truck.  Weight is stable.  No lightheadedness or palpitations.    ECG (personally reviewed): atrial fibrillation rate 73  Labs (5/21): K 4.5, creatinine 1.73 => 1.9, digoxin 0.9 Labs (6/21): K 5.2, creatinine 1.76  PMH: 1. Atrial fibrillation: Paroxysmal.  - DCCV to NSR in 3/21, ERAF.   - Admitted in 5/21 for Tikosyn intiation and  repeat DCCV to NSR.  - LAA thrombus in 7/21, atrial fibrillation ablation not done and Tikosyn stopped.  2. 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 Renal cell carcinoma s/p right nephrectomy 4. CKD: Stage 3. 5. Hyperlipidemia.  6. H/o COVID-19 PNA in 1/21.  7. H/o GI bleeding from gastric ulcers in 3/21.  8. Cardiomyopathy: Echo (2/21) with EF 25-30%.  - Cardiolite (2/21) with inferior scar, no ischemia.  - Echo (3/21): EF 30-35%, global hypokinesis, mild MR.  - Echo (6/21): EF 40-45%, RV normal size and systolic function, PASP 36, IVC dilated.  9. RLE DVT in 2/21 post-COVID-19 PNA.   Social History   Socioeconomic History  . Marital status: Married    Spouse name: Not on file  . Number of children: Not on file  . Years of education: Not on file  . Highest education level: Not on file  Occupational History  . Occupation: truck driver  Tobacco Use  . Smoking status: Former Smoker    Packs/day: 2.00    Years: 35.00    Pack years: 70.00    Types: Cigarettes    Quit date: 2000    Years since quitting: 21.5  . Smokeless tobacco: Never Used  Vaping Use  . Vaping Use: Never used  Substance and Sexual Activity  . Alcohol use: Yes    Alcohol/week: 1.0 - 2.0 standard drink      Types: 1 - 2 Cans of beer per week    Comment: Socially  . Drug use: No  . Sexual activity: Not on file  Other Topics Concern  . Not on file  Social History Narrative   Lives with wife in a one story home.  Has 3 children.     Works as a truck driver.     Education: 12th grade.   Social Determinants of Health   Financial Resource Strain:   . Difficulty of Paying Living Expenses:   Food Insecurity:   . Worried About Running Out of Food in the Last Year:   . Ran Out of Food in the Last Year:   Transportation Needs:   . Lack of Transportation (Medical):   . Lack of Transportation (Non-Medical):   Physical Activity:   . Days of Exercise per Week:   . Minutes of Exercise per Session:    Stress:   . Feeling of Stress :   Social Connections:   . Frequency of Communication with Friends and Family:   . Frequency of Social Gatherings with Friends and Family:   . Attends Religious Services:   . Active Member of Clubs or Organizations:   . Attends Club or Organization Meetings:   . Marital Status:   Intimate Partner Violence:   . Fear of Current or Ex-Partner:   . Emotionally Abused:   . Physically Abused:   . Sexually Abused:    Family History  Problem Relation Age of Onset  . Cancer Mother   . Heart attack Father   . Heart attack Brother   . Heart disease Sister   . Stroke Paternal Grandmother    ROS: All systems reviewed and negative except as per HPI.   Current Outpatient Medications  Medication Sig Dispense Refill  . ACCU-CHEK GUIDE test strip daily. for testing    . Accu-Chek Softclix Lancets lancets     . acetaminophen (TYLENOL) 500 MG tablet Take 1,000 mg by mouth every 6 (six) hours as needed for mild pain or headache.    . apixaban (ELIQUIS) 5 MG TABS tablet Take 1 tablet (5 mg total) by mouth 2 (two) times daily. 60 tablet 2  . ascorbic acid (V-R VITAMIN C) 500 MG tablet Take 500 mg by mouth every evening.    . atorvastatin (LIPITOR) 10 MG tablet Take 1 tablet (10 mg total) by mouth daily. 30 tablet 6  . Blood Glucose Monitoring Suppl (ACCU-CHEK GUIDE ME) w/Device KIT See admin instructions.    . carvedilol (COREG) 6.25 MG tablet Take 2 tablets (12.5 mg total) by mouth 2 (two) times daily with a meal. 60 tablet 6  . digoxin (LANOXIN) 0.125 MG tablet Take 0.125 mg by mouth daily.     . Garlic 1000 MG CAPS Take 1,000 mg by mouth every evening.    . Glucosamine HCl 1000 MG TABS Take 1,000 mg by mouth every evening.    . Multiple Vitamin (MULTIVITAMIN WITH MINERALS) TABS tablet Take 1 tablet by mouth daily with breakfast.     . Omega-3 Fatty Acids (FISH OIL) 1000 MG CAPS Take 1,000 mg by mouth every evening.     . pantoprazole (PROTONIX) 40 MG tablet Take 1  tablet (40 mg total) by mouth 2 (two) times daily. 60 tablet 3  . sacubitril-valsartan (ENTRESTO) 24-26 MG Take 1 tablet by mouth 2 (two) times daily. 60 tablet 5  . Saw Palmetto 450 MG CAPS Take 450 mg by mouth every evening.    .   dapagliflozin propanediol (FARXIGA) 10 MG TABS tablet Take 1 tablet (10 mg total) by mouth daily before breakfast. 30 tablet 6   No current facility-administered medications for this encounter.   BP (!) 102/64   Pulse 68   Wt (!) 111.1 kg (245 lb)   SpO2 98%   BMI 32.32 kg/m  General: NAD Neck: JVP 8-9 cm, no thyromegaly or thyroid nodule.  Lungs: Clear to auscultation bilaterally with normal respiratory effort. CV: Nondisplaced PMI.  Heart irregular S1/S2, no S3/S4, no murmur.  1+ ankle edema.  No carotid bruit.  Normal pedal pulses.  Abdomen: Soft, nontender, no hepatosplenomegaly, no distention.  Skin: Intact without lesions or rashes.  Neurologic: Alert and oriented x 3.  Psych: Normal affect. Extremities: No clubbing or cyanosis.  HEENT: Normal.   Assessment/Plan: 1. Chronic systolic CHF: Echo in 2/21 with EF 25-30%, echo in 3/21 with EF 30-35%.  Cardiolite with inferior scar vs artifact, no ischemia. Cause of cardiomyopathy is uncertain.  Could be related to atrial fibrillation (though not currently in RVR).  Have not ruled out CAD or cardiomyopathy related to myocarditis (?COVID-19 myocarditis).  Repeat echo in 6/21 showed EF up to 40-45%.  On exam, he is mildly volume overloaded with NYHA class II symptoms. Medication titration limited by soft blood pressure.  - Continue Coreg 6.25 mg bid.  - Continue Entresto 24/26 bid.  - I do not think that he needs a diuretic currently.  - Continue digoxin, check level today.    - Will not start spironolactone right now as K has been mildly elevated. Need to check BMET today.  - I will add dapagliflozin 10 mg daily given CHF and CKD.  BMET today and in 10 days.  - He will need eventual RHC/LHC if EF does not  return to normal after DCCV, this will be complicated by elevated creatinine.   2. Atrial fibrillation: Persistent.  He is back in atrial fibrillation despite DCCV x 2 on Tikosyn.  I would like to get him in NSR to see if this helps improve his LV function. He was unable to have atrial fibrillation ablation due to LAA thrombus and Tikosyn was stopped.   - He is now taking Eliquis 5 mg bid without missing doses.  - Plan will be TEE in about 4 wks to make sure no LA appendage thrombus, then will start amiodarone and after about 2 wks, will cardiovert.  We discussed risks/benefits of procedure and he agrees to it.  - Right now, no plan for atrial fibrillation ablation.   3. CKD: Stage 3.  BMET today.  Starting dapagliflozin which improves outcomes with CKD.   Followup 4 wks with TEE  Melvin Ramirez 05/01/2020  

## 2020-05-02 ENCOUNTER — Other Ambulatory Visit (HOSPITAL_COMMUNITY): Payer: Self-pay | Admitting: Cardiology

## 2020-05-05 ENCOUNTER — Ambulatory Visit (HOSPITAL_COMMUNITY): Payer: Commercial Managed Care - PPO | Admitting: Nurse Practitioner

## 2020-05-06 ENCOUNTER — Encounter (HOSPITAL_COMMUNITY): Payer: Commercial Managed Care - PPO | Admitting: Cardiology

## 2020-05-16 ENCOUNTER — Other Ambulatory Visit (HOSPITAL_COMMUNITY)
Admission: RE | Admit: 2020-05-16 | Discharge: 2020-05-16 | Disposition: A | Discharge status: Home / Self Care | Payer: Commercial Managed Care - PPO | Source: Ambulatory Visit | Attending: Cardiology | Admitting: Cardiology

## 2020-05-16 DIAGNOSIS — Z20822 Contact with and (suspected) exposure to covid-19: Secondary | ICD-10-CM | POA: Insufficient documentation

## 2020-05-16 DIAGNOSIS — Z01812 Encounter for preprocedural laboratory examination: Secondary | ICD-10-CM | POA: Diagnosis present

## 2020-05-16 LAB — SARS CORONAVIRUS 2 (TAT 6-24 HRS): SARS Coronavirus 2: NEGATIVE

## 2020-05-17 NOTE — Progress Notes (Signed)
Pre op call done for endo procedure 05/18/20. Spoke with patient's wife due to patient being unavailable for the moment. Patient's wife confirms he has been quarantined since covid test, will take meds in morning with sip of water - NPO otherwise, and has a ride post procedure. All questions addressed.

## 2020-05-18 ENCOUNTER — Encounter (HOSPITAL_COMMUNITY)
Admission: RE | Disposition: A | Discharge status: Home / Self Care | Payer: Self-pay | Source: Home / Self Care | Attending: Cardiology

## 2020-05-18 ENCOUNTER — Other Ambulatory Visit (HOSPITAL_COMMUNITY): Payer: Self-pay

## 2020-05-18 ENCOUNTER — Ambulatory Visit (HOSPITAL_COMMUNITY)
Admission: RE | Admit: 2020-05-18 | Discharge: 2020-05-18 | Disposition: A | Discharge status: Home / Self Care | Payer: Commercial Managed Care - PPO | Attending: Cardiology | Admitting: Cardiology

## 2020-05-18 ENCOUNTER — Encounter (HOSPITAL_COMMUNITY): Payer: Self-pay | Admitting: Cardiology

## 2020-05-18 ENCOUNTER — Other Ambulatory Visit: Payer: Self-pay

## 2020-05-18 ENCOUNTER — Ambulatory Visit (HOSPITAL_BASED_OUTPATIENT_CLINIC_OR_DEPARTMENT_OTHER)
Admission: RE | Admit: 2020-05-18 | Discharge: 2020-05-18 | Disposition: A | Discharge status: Home / Self Care | Payer: Commercial Managed Care - PPO | Source: Ambulatory Visit | Attending: Cardiology | Admitting: Cardiology

## 2020-05-18 DIAGNOSIS — Z85528 Personal history of other malignant neoplasm of kidney: Secondary | ICD-10-CM | POA: Diagnosis not present

## 2020-05-18 DIAGNOSIS — I5022 Chronic systolic (congestive) heart failure: Secondary | ICD-10-CM | POA: Insufficient documentation

## 2020-05-18 DIAGNOSIS — I4819 Other persistent atrial fibrillation: Secondary | ICD-10-CM | POA: Diagnosis not present

## 2020-05-18 DIAGNOSIS — Z7984 Long term (current) use of oral hypoglycemic drugs: Secondary | ICD-10-CM | POA: Diagnosis not present

## 2020-05-18 DIAGNOSIS — Z86718 Personal history of other venous thrombosis and embolism: Secondary | ICD-10-CM | POA: Diagnosis not present

## 2020-05-18 DIAGNOSIS — I513 Intracardiac thrombosis, not elsewhere classified: Secondary | ICD-10-CM | POA: Diagnosis present

## 2020-05-18 DIAGNOSIS — I340 Nonrheumatic mitral (valve) insufficiency: Secondary | ICD-10-CM | POA: Diagnosis not present

## 2020-05-18 DIAGNOSIS — E1140 Type 2 diabetes mellitus with diabetic neuropathy, unspecified: Secondary | ICD-10-CM | POA: Insufficient documentation

## 2020-05-18 DIAGNOSIS — Z87891 Personal history of nicotine dependence: Secondary | ICD-10-CM | POA: Diagnosis not present

## 2020-05-18 DIAGNOSIS — N183 Chronic kidney disease, stage 3 unspecified: Secondary | ICD-10-CM | POA: Insufficient documentation

## 2020-05-18 DIAGNOSIS — Z7901 Long term (current) use of anticoagulants: Secondary | ICD-10-CM | POA: Insufficient documentation

## 2020-05-18 DIAGNOSIS — E1122 Type 2 diabetes mellitus with diabetic chronic kidney disease: Secondary | ICD-10-CM | POA: Diagnosis not present

## 2020-05-18 DIAGNOSIS — Z8616 Personal history of COVID-19: Secondary | ICD-10-CM | POA: Diagnosis not present

## 2020-05-18 DIAGNOSIS — I429 Cardiomyopathy, unspecified: Secondary | ICD-10-CM | POA: Insufficient documentation

## 2020-05-18 DIAGNOSIS — Z79899 Other long term (current) drug therapy: Secondary | ICD-10-CM | POA: Insufficient documentation

## 2020-05-18 DIAGNOSIS — E785 Hyperlipidemia, unspecified: Secondary | ICD-10-CM | POA: Diagnosis not present

## 2020-05-18 HISTORY — PX: TEE WITHOUT CARDIOVERSION: SHX5443

## 2020-05-18 LAB — BASIC METABOLIC PANEL
Anion gap: 8 (ref 5–15)
BUN: 18 mg/dL (ref 8–23)
CO2: 25 mmol/L (ref 22–32)
Calcium: 9 mg/dL (ref 8.9–10.3)
Chloride: 108 mmol/L (ref 98–111)
Creatinine, Ser: 1.61 mg/dL — ABNORMAL HIGH (ref 0.61–1.24)
GFR calc Af Amer: 49 mL/min — ABNORMAL LOW (ref >60)
GFR calc non Af Amer: 42 mL/min — ABNORMAL LOW (ref >60)
Glucose, Bld: 123 mg/dL — ABNORMAL HIGH (ref 70–99)
Potassium: 4.7 mmol/L (ref 3.5–5.1)
Sodium: 141 mmol/L (ref 135–145)

## 2020-05-18 SURGERY — ECHOCARDIOGRAM, TRANSESOPHAGEAL
Anesthesia: Moderate Sedation

## 2020-05-18 MED ORDER — DIGOXIN 125 MCG PO TABS: 0.1250 mg | ORAL_TABLET | Freq: Every day | ORAL | 3 refills | Status: DC

## 2020-05-18 MED ORDER — MIDAZOLAM HCL (PF) 10 MG/2ML IJ SOLN
INTRAMUSCULAR | Status: DC | PRN
  Administered 2020-05-18 (×2): 2 mg via INTRAVENOUS

## 2020-05-18 MED ORDER — FENTANYL CITRATE (PF) 100 MCG/2ML IJ SOLN
INTRAMUSCULAR | Status: DC | PRN
  Administered 2020-05-18 (×2): 25 ug via INTRAVENOUS

## 2020-05-18 MED ORDER — AMIODARONE HCL 200 MG PO TABS
200.0000 mg | ORAL_TABLET | Freq: Two times a day (BID) | ORAL | 3 refills | Status: DC
Start: 2020-05-18 — End: 2020-06-21

## 2020-05-18 MED ORDER — SODIUM CHLORIDE 0.9 % IV SOLN
INTRAVENOUS | Status: DC
  Administered 2020-05-18: 500 mL via INTRAVENOUS

## 2020-05-18 MED ORDER — BUTAMBEN-TETRACAINE-BENZOCAINE 2-2-14 % EX AERO
INHALATION_SPRAY | CUTANEOUS | Status: DC | PRN
  Administered 2020-05-18: 2 via TOPICAL

## 2020-05-18 MED ORDER — FENTANYL CITRATE (PF) 100 MCG/2ML IJ SOLN: 25.0000 ug | INTRAMUSCULAR | Status: DC | PRN

## 2020-05-18 MED ORDER — MIDAZOLAM HCL (PF) 5 MG/ML IJ SOLN
INTRAMUSCULAR | Status: AC
  Filled 2020-05-18: qty 2

## 2020-05-18 MED ORDER — FENTANYL CITRATE (PF) 100 MCG/2ML IJ SOLN
INTRAMUSCULAR | Status: AC
  Filled 2020-05-18: qty 4

## 2020-05-18 NOTE — Interval H&P Note (Signed)
History and Physical Interval Note:  05/18/2020 10:20 AM  Melvin Ramirez  has presented today for surgery, with the diagnosis of LEFT ATRIAL THROMBUS.  The various methods of treatment have been discussed with the patient and family. After consideration of risks, benefits and other options for treatment, the patient has consented to  Procedure(s): TRANSESOPHAGEAL ECHOCARDIOGRAM (TEE) (N/A) as a surgical intervention.  The patient's history has been reviewed, patient examined, no change in status, stable for surgery.  I have reviewed the patient's chart and labs.  Questions were answered to the patient's satisfaction.     Koren Plyler [employer]

## 2020-05-18 NOTE — CV Procedure (Signed)
Procedure: TEE  Indication: LA appendage thrombus.   Sedation: Versed 4 mg IV, Fentanyl 50 mcg IV  Findings: Please see echo section for full report.  Mildly dilated LV with normal wall thickness.  EF difficult due to irregularity (PVCs and atrial fibrillation), but appears to be in the 35-40% range, diffuse hypokinesis.  Mildly dilated RV with mildly decreased systolic function.  Moderate left atrial enlargement.  There was no thrombus in the LA or appendage, but there was smoke noted in the LA appendage.  The appendage was relatively small.  Moderate right atrial enlargement.  No PFO or ASD by color doppler.  Trivial TR, peak RV-RA gradient 24 mmHg.  Mild mitral regurgitation.  Trileaflet aortic valve with no stenosis or regurgitation. Normal caliber thoracic aorta with mild plaque.   Impression: Smoke but no thrombus in the LA appendage.  I will start him on amiodarone 200 mg bid and attempt DCCV in 2 wks.   [name] 05/18/2020 10:34 AM

## 2020-05-18 NOTE — Discharge Instructions (Signed)
Start amiodarone 200 mg twice a day for 2 weeks, then once a day.  Will arrange cardioversion in 2 weeks.   TEE  YOU HAD AN CARDIAC PROCEDURE TODAY: Refer to the procedure report and other information in the discharge instructions given to you for any specific questions about what was found during the examination. If this information does not answer your questions, please call [facility] office at [phone] to clarify.   DIET: Your first meal following the procedure should be a light meal and then it is ok to progress to your normal diet. A half-sandwich or bowl of soup is an example of a good first meal. Heavy or fried foods are harder to digest and may make you feel nauseous or bloated. Drink plenty of fluids but you should avoid alcoholic beverages for 24 hours. If you had a esophageal dilation, please see attached instructions for diet.   ACTIVITY: Your care partner should take you home directly after the procedure. You should plan to take it easy, moving slowly for the rest of the day. You can resume normal activity the day after the procedure however YOU SHOULD NOT DRIVE, use power tools, machinery or perform tasks that involve climbing or major physical exertion for 24 hours (because of the sedation medicines used during the test).   SYMPTOMS TO REPORT IMMEDIATELY: A cardiologist can be reached at any hour. Please call [phone] for any of the following symptoms:  Vomiting of blood or coffee ground material  New, significant abdominal pain  New, significant chest pain or pain under the shoulder blades  Painful or persistently difficult swallowing  New shortness of breath  Black, tarry-looking or red, bloody stools  FOLLOW UP:  Please also call with any specific questions about appointments or follow up tests.

## 2020-05-18 NOTE — Progress Notes (Signed)
  Echocardiogram Echocardiogram Transesophageal has been performed.  Melvin Ramirez 05/18/2020, 10:52 AM

## 2020-05-19 ENCOUNTER — Other Ambulatory Visit (HOSPITAL_COMMUNITY): Payer: Self-pay | Admitting: *Deleted

## 2020-05-19 ENCOUNTER — Telehealth (HOSPITAL_COMMUNITY): Payer: Self-pay | Admitting: *Deleted

## 2020-05-19 ENCOUNTER — Encounter (HOSPITAL_COMMUNITY): Payer: Self-pay | Admitting: Cardiology

## 2020-05-19 ENCOUNTER — Telehealth (HOSPITAL_COMMUNITY): Payer: Self-pay

## 2020-05-19 DIAGNOSIS — I5042 Chronic combined systolic (congestive) and diastolic (congestive) heart failure: Secondary | ICD-10-CM

## 2020-05-19 DIAGNOSIS — I4819 Other persistent atrial fibrillation: Secondary | ICD-10-CM

## 2020-05-19 MED ORDER — DIGOXIN 125 MCG PO TABS: 0.0625 mg | ORAL_TABLET | Freq: Every day | ORAL | 3 refills | Status: DC

## 2020-05-19 NOTE — Telephone Encounter (Signed)
error 

## 2020-05-19 NOTE — Telephone Encounter (Signed)
-----   Message from Larey Dresser, MD sent at 05/18/2020 10:59 AM EDT ----- 1. Start amiodarone 200 mg bid x 2 wks then 200 mg daily after that.  2. DCCV in 2 wks.  3. Do not miss any Eliquis doses.

## 2020-05-19 NOTE — Telephone Encounter (Signed)
Spoke w/pt's wife, she is aware and states they received and he started the Amio last night, although pharmacy gave them a difficult time picking it up due to him being on Digoxin as well.  Discussed this with Audry Riles, pharm D, she reviewed chart and advised pt should decrease Dig to 0.0625 mg Daily and check dig level next week.  Wife aware, agreeable, and verbalized understanding, she states he is seeing Dr Posey Pronto, nephrology, next week and wants them to draw lab, order faxed to his office.  DCCV sch for 8/30 at 7:30 AM, Covid test sch for 8/28 at 9:20. All instructions reviewed with her over phone, she is agreeable and verbalizes understanding to all, info also sent to her via mychart.

## 2020-05-27 ENCOUNTER — Other Ambulatory Visit (HOSPITAL_COMMUNITY): Payer: Self-pay | Admitting: Cardiology

## 2020-06-02 ENCOUNTER — Encounter (HOSPITAL_COMMUNITY): Payer: Commercial Managed Care - PPO | Admitting: Cardiology

## 2020-06-02 ENCOUNTER — Other Ambulatory Visit (HOSPITAL_COMMUNITY): Payer: Self-pay | Admitting: Cardiology

## 2020-06-04 ENCOUNTER — Other Ambulatory Visit (HOSPITAL_COMMUNITY)
Admission: RE | Admit: 2020-06-04 | Discharge: 2020-06-04 | Disposition: A | Discharge status: Home / Self Care | Payer: Commercial Managed Care - PPO | Source: Ambulatory Visit | Attending: Cardiology | Admitting: Cardiology

## 2020-06-04 DIAGNOSIS — Z01812 Encounter for preprocedural laboratory examination: Secondary | ICD-10-CM | POA: Diagnosis not present

## 2020-06-04 DIAGNOSIS — Z20822 Contact with and (suspected) exposure to covid-19: Secondary | ICD-10-CM | POA: Insufficient documentation

## 2020-06-04 LAB — SARS CORONAVIRUS 2 (TAT 6-24 HRS): SARS Coronavirus 2: NEGATIVE

## 2020-06-05 NOTE — Anesthesia Preprocedure Evaluation (Addendum)
Anesthesia Evaluation  Patient identified by MRN, date of birth, ID band Patient awake    Reviewed: Allergy & Precautions, NPO status , Patient's Chart, lab work & pertinent test results, reviewed documented beta blocker date and time   History of Anesthesia Complications (+) DIFFICULT AIRWAY  Airway Mallampati: II  TM Distance: >3 FB Neck ROM: Full    Dental  (+) Edentulous Upper, Edentulous Lower, Upper Dentures, Dental Advisory Given   Pulmonary COPD, former smoker,    Pulmonary exam normal breath sounds clear to auscultation       Cardiovascular +CHF  Normal cardiovascular exam+ dysrhythmias Atrial Fibrillation  Rhythm:Irregular Rate:Normal  TEE 05/2020 1. Difficult to assess LV function due to frequent PVCs and irregular HR with atrial fibrillation. Left ventricular ejection fraction, by estimation, is 35 to 40%. The left ventricle has moderately decreased function. The left ventricle demonstrates global hypokinesis. The left ventricular internal cavity size was mildly dilated.  2. Right ventricular systolic function is mildly reduced. The right ventricular size is mildly enlarged. There is normal pulmonary artery systolic pressure.  3. There was no thrombus in the LA or appendage, but there was smoke noted in the LA appendage. The appendage was relatively small. Left atrial size was moderately dilated.  4. Right atrial size was moderately dilated.  5. No PFO or ASD by color doppler.  6. The mitral valve is normal in structure. Mild mitral valve  regurgitation. No evidence of mitral stenosis.  7. The aortic valve is tricuspid. Aortic valve regurgitation is not visualized. No aortic stenosis is present.  8. Peak RV-RA gradient 24 mmHg.  9. Normal caliber thoracic aorta with mild plaque.  Stress Test 12/2019 There was no ST segment deviation noted during stress. Baseline EKG showed atrial fibrillation with frequent PVCs  with no change in EKG with Lexiscan infusion. There is a medium defect of moderate severity present in the basal inferior, mid inferior, apical inferior and apex location. The defect is non-reversible. No gated images available to assess for inferior wall motion abnormality. Inferior defect consistent with infarct or diaphragmatic attenuation artifact. No ischemia noted. Gated images not performed due to underlying atrial fibrillation.  This is an intermediate risk study    Neuro/Psych negative neurological ROS  negative psych ROS   GI/Hepatic Neg liver ROS, PUD,   Endo/Other  negative endocrine ROSdiabetes, Oral Hypoglycemic Agents  Renal/GU Renal InsufficiencyRenal diseaseH/o RCC s/p nephrecotomy  negative genitourinary   Musculoskeletal negative musculoskeletal ROS (+)   Abdominal   Peds  Hematology  (+) Blood dyscrasia (on eliquis), ,   Anesthesia Other Findings   Reproductive/Obstetrics                           Anesthesia Physical Anesthesia Plan  ASA: III  Anesthesia Plan: General   Post-op Pain Management:    Induction: Intravenous  PONV Risk Score and Plan: 2 and Propofol infusion and Treatment may vary due to age or medical condition  Airway Management Planned: Natural Airway  Additional Equipment:   Intra-op Plan:   Post-operative Plan:   Informed Consent: I have reviewed the patients History and Physical, chart, labs and discussed the procedure including the risks, benefits and alternatives for the proposed anesthesia with the patient or authorized representative who has indicated his/her understanding and acceptance.     Dental advisory given  Plan Discussed with: CRNA  Anesthesia Plan Comments:         Anesthesia Quick Evaluation

## 2020-06-06 ENCOUNTER — Encounter (HOSPITAL_COMMUNITY)
Admission: RE | Disposition: A | Discharge status: Home / Self Care | Payer: Self-pay | Source: Home / Self Care | Attending: Cardiology

## 2020-06-06 ENCOUNTER — Encounter (HOSPITAL_COMMUNITY): Payer: Self-pay | Admitting: Cardiology

## 2020-06-06 ENCOUNTER — Other Ambulatory Visit: Payer: Self-pay

## 2020-06-06 ENCOUNTER — Ambulatory Visit (HOSPITAL_COMMUNITY): Payer: Commercial Managed Care - PPO | Admitting: Anesthesiology

## 2020-06-06 ENCOUNTER — Ambulatory Visit (HOSPITAL_COMMUNITY)
Admission: RE | Admit: 2020-06-06 | Discharge: 2020-06-06 | Disposition: A | Discharge status: Home / Self Care | Payer: Commercial Managed Care - PPO | Attending: Cardiology | Admitting: Cardiology

## 2020-06-06 DIAGNOSIS — Z79899 Other long term (current) drug therapy: Secondary | ICD-10-CM | POA: Insufficient documentation

## 2020-06-06 DIAGNOSIS — Z886 Allergy status to analgesic agent status: Secondary | ICD-10-CM | POA: Diagnosis not present

## 2020-06-06 DIAGNOSIS — Z905 Acquired absence of kidney: Secondary | ICD-10-CM | POA: Diagnosis not present

## 2020-06-06 DIAGNOSIS — I5042 Chronic combined systolic (congestive) and diastolic (congestive) heart failure: Secondary | ICD-10-CM | POA: Insufficient documentation

## 2020-06-06 DIAGNOSIS — E1122 Type 2 diabetes mellitus with diabetic chronic kidney disease: Secondary | ICD-10-CM | POA: Insufficient documentation

## 2020-06-06 DIAGNOSIS — Z7901 Long term (current) use of anticoagulants: Secondary | ICD-10-CM | POA: Diagnosis not present

## 2020-06-06 DIAGNOSIS — Z7984 Long term (current) use of oral hypoglycemic drugs: Secondary | ICD-10-CM | POA: Insufficient documentation

## 2020-06-06 DIAGNOSIS — N183 Chronic kidney disease, stage 3 unspecified: Secondary | ICD-10-CM | POA: Insufficient documentation

## 2020-06-06 DIAGNOSIS — I4819 Other persistent atrial fibrillation: Secondary | ICD-10-CM | POA: Insufficient documentation

## 2020-06-06 DIAGNOSIS — Z85528 Personal history of other malignant neoplasm of kidney: Secondary | ICD-10-CM | POA: Insufficient documentation

## 2020-06-06 DIAGNOSIS — I4891 Unspecified atrial fibrillation: Secondary | ICD-10-CM

## 2020-06-06 HISTORY — PX: CARDIOVERSION: SHX1299

## 2020-06-06 LAB — POCT I-STAT, CHEM 8
BUN: 28 mg/dL — ABNORMAL HIGH (ref 8–23)
Calcium, Ion: 1.15 mmol/L (ref 1.15–1.40)
Chloride: 106 mmol/L (ref 98–111)
Creatinine, Ser: 1.9 mg/dL — ABNORMAL HIGH (ref 0.61–1.24)
Glucose, Bld: 138 mg/dL — ABNORMAL HIGH (ref 70–99)
HCT: 44 % (ref 39.0–52.0)
Hemoglobin: 15 g/dL (ref 13.0–17.0)
Potassium: 4.8 mmol/L (ref 3.5–5.1)
Sodium: 140 mmol/L (ref 135–145)
TCO2: 24 mmol/L (ref 22–32)

## 2020-06-06 SURGERY — CARDIOVERSION
Anesthesia: General

## 2020-06-06 MED ORDER — LIDOCAINE 2% (20 MG/ML) 5 ML SYRINGE
INTRAMUSCULAR | Status: DC | PRN
  Administered 2020-06-06: 40 mg via INTRAVENOUS

## 2020-06-06 MED ORDER — PROPOFOL 10 MG/ML IV BOLUS
INTRAVENOUS | Status: DC | PRN
  Administered 2020-06-06: 70 mg via INTRAVENOUS

## 2020-06-06 MED ORDER — SODIUM CHLORIDE 0.9 % IV SOLN: INTRAVENOUS | Status: DC | PRN

## 2020-06-06 NOTE — H&P (Signed)
Advanced Heart Failure Team History and Physical Note   PCP:  [name], MD  PCP-Cardiology: No primary care provider on file.     Reason for Admission: atrial fib   HPI:    Patient with atrial fibrillation arrives for DCCV.     Review of Systems: [y] = yes, _0  = no   General: Weight gain _1 ; Weight loss _2 ; Anorexia _3 ; Fatigue _4 ; Fever _5 ; Chills _6 ; Weakness _7   Cardiac: Chest pain/pressure _8 ; Resting SOB _9 ; Exertional SOB _10 ; Orthopnea _11 ; Pedal Edema _12 ; Palpitations _13 ; Syncope _14 ; Presyncope _15 ; Paroxysmal nocturnal dyspnea_16   Pulmonary: Cough _17 ; Wheezing_18 ; Hemoptysis_19 ; Sputum _20 ; Snoring _21   GI: Vomiting_22 ; Dysphagia_23 ; Melena_24 ; Hematochezia _25 ; Heartburn_26 ; Abdominal pain _27 ; Constipation _28 ; Diarrhea _29 ; BRBPR _30   GU: Hematuria_31 ; Dysuria _32 ; Nocturia_33   Vascular: Pain in legs with walking _34 ; Pain in feet with lying flat _35 ; Non-healing sores _36 ; Stroke _37 ; TIA _38 ; Slurred speech _39 ;  Neuro: Headaches_40 ; Vertigo_41 ; Seizures_42 ; Paresthesias_43 ;Blurred vision _44 ; Diplopia _45 ; Vision changes _46   Ortho/Skin: Arthritis _47 ; Joint pain _48 ; Muscle pain _49 ; Joint swelling _50 ; Back Pain _51 ; Rash _52   Psych: Depression_53 ; Anxiety_54   Heme: Bleeding problems _55 ; Clotting disorders _56 ; Anemia _57   Endocrine: Diabetes _58 ; Thyroid dysfunction_59    Home Medications Prior to Admission medications   Medication Sig Start Date End Date Taking? Authorizing Provider  ACCU-CHEK GUIDE test strip daily. for testing 02/04/20  Yes [provider]  Accu-Chek Softclix Lancets lancets  02/04/20  Yes [provider]  acetaminophen (TYLENOL) 500 MG tablet Take 1,000 mg by mouth every 6 (six) hours as needed for mild pain or headache.   Yes [provider]  amiodarone (PACERONE) 200 MG tablet Take 1 tablet (200 mg total) by mouth 2 (two) times daily. 05/18/20  Yes Larey Dresser, MD  apixaban (ELIQUIS) 5 MG  TABS tablet Take 1 tablet (5 mg total) by mouth 2 (two) times daily. 03/28/20  Yes Larey Dresser, MD  ascorbic acid (SM VITAMIN C) 1000 MG tablet Take 1,000 mg by mouth every evening.    Yes [provider]  atorvastatin (LIPITOR) 10 MG tablet Take 1 tablet (10 mg total) by mouth daily. 04/29/20  Yes Larey Dresser, MD  Blood Glucose Monitoring Suppl (ACCU-CHEK GUIDE ME) w/Device KIT See admin instructions. 02/04/20  Yes [provider]  carvedilol (COREG) 6.25 MG tablet Take 2 tablets (12.5 mg total) by mouth 2 (two) times daily with a meal. 04/29/20  Yes Larey Dresser, MD  dapagliflozin propanediol (FARXIGA) 10 MG TABS tablet Take 1 tablet (10 mg total) by mouth daily before breakfast. 04/29/20  Yes Larey Dresser, MD  digoxin (LANOXIN) 0.125 MG tablet Take 0.5 tablets (0.0625 mg total) by mouth daily. 05/19/20  Yes Larey Dresser, MD  Garlic 3785 MG CAPS Take 1,000 mg by mouth every evening.   Yes [provider]  Glucosamine HCl 1000 MG TABS Take 1,000 mg by mouth every evening.   Yes [provider]  Multiple Vitamin (MULTIVITAMIN WITH MINERALS) TABS tablet Take 1 tablet by mouth daily with breakfast.    Yes [provider]  Omega-3 Fatty  Acids (FISH OIL PO) Take 350 mg by mouth every evening.    Yes [provider]  pantoprazole (PROTONIX) 40 MG tablet Take 1 tablet (40 mg total) by mouth 2 (two) times daily. Please contact PCP for additional refills Patient taking differently: Take 40 mg by mouth daily.  05/04/20  Yes Larey Dresser, MD  sacubitril-valsartan (ENTRESTO) 24-26 MG Take 1 tablet by mouth 2 (two) times daily. 02/23/20  Yes Larey Dresser, MD  Saw Palmetto, Serenoa repens, (SAW PALMETTO PO) Take 540 mg by mouth every evening.    Yes [provider]  torsemide (DEMADEX) 10 MG tablet Take 10 mg by mouth daily as needed (fluid).   Yes [provider]    Past Medical History: Past Medical History:    Diagnosis Date  . 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 Chronic kidney disease   . CKD (chronic kidney disease) stage 3, GFR 30-59 ml/min 12/13/2019  . Diabetes mellitus ([place])   . Gastric ulcer 12/13/2019   +H. Pylori. Bleeding required clipping.  . H/O blood clots   . History of kidney cancer   . Persistent atrial fibrillation ([place]) 12/13/2019  . Renal cell carcinoma ([place]) 12/13/2019   S/p nephrectomy    Past Surgical History: Past Surgical History:  Procedure Laterality Date  . APPENDECTOMY    . ATRIAL FIBRILLATION ABLATION N/A 04/07/2020   Procedure: ATRIAL FIBRILLATION ABLATION;  Surgeon: [name], MD;  Location: [place] [facility];  Service: Cardiovascular;  Laterality: N/A;  . BIOPSY  11/19/2019   Procedure: BIOPSY;  Surgeon: Ronald Lobo, MD;  Location: [place];  Service: Endoscopy;;  . CARDIOVERSION N/A 12/17/2019   Procedure: CARDIOVERSION;  Surgeon: Geralynn Rile, MD;  Location: [place];  Service: Cardiovascular;  Laterality: N/A;  . CARDIOVERSION N/A 02/18/2020   Procedure: CARDIOVERSION;  Surgeon: Jerline Pain, MD;  Location: [facility] ENDOSCOPY;  Service: Cardiovascular;  Laterality: N/A;  . CARDIOVERSION N/A 02/29/2020   Procedure: CARDIOVERSION;  Surgeon: Larey Dresser, MD;  Location: [facility] ENDOSCOPY;  Service: Cardiovascular;  Laterality: N/A;  . ESOPHAGOGASTRODUODENOSCOPY N/A 11/19/2019   Procedure: ESOPHAGOGASTRODUODENOSCOPY (EGD);  Surgeon: Ronald Lobo, MD;  Location: [facility] ENDOSCOPY;  Service: Endoscopy;  Laterality: N/A;  . HEMOSTASIS CLIP PLACEMENT  11/19/2019   Procedure: HEMOSTASIS CLIP PLACEMENT;  Surgeon: Ronald Lobo, MD;  Location: [place];  Service: Endoscopy;;  . NEPHRECTOMY    . TEE WITHOUT CARDIOVERSION N/A 05/18/2020   Procedure: TRANSESOPHAGEAL ECHOCARDIOGRAM (TEE);  Surgeon: Larey Dresser, MD;  Location: [facility] ENDOSCOPY;  Service: Cardiovascular;  Laterality: N/A;    Family History:  Family  History  Problem Relation Age of Onset  . Cancer Mother   . Heart attack Father   . Heart attack Brother   . Heart disease Sister   . Stroke Paternal Grandmother     Social History: Social History   Socioeconomic History  . Marital status: Married    Spouse name: Not on file  . Number of children: Not on file  . Years of education: Not on file  . Highest education level: Not on file  Occupational History  . Occupation: truck Geophysicist/field seismologist  Tobacco Use  . Smoking status: Former Smoker    Packs/day: 2.00    Years: 35.00    Pack years: 70.00    Types: Cigarettes    Quit date: 2000    Years since quitting: 21.6  . Smokeless tobacco: Never Used  Vaping Use  . Vaping Use: Never  used  Substance and Sexual Activity  . Alcohol use: Yes    Alcohol/week: 1.0 - 2.0 standard drink    Types: 1 - 2 Cans of beer per week    Comment: Socially  . Drug use: No  . Sexual activity: Not on file  Other Topics Concern  . Not on file  Social History Narrative   Lives with wife in a one story home.  Has 3 children.     Works as a Administrator.     Education: 12th grade.   Social Determinants of Health   Financial Resource Strain:   . Difficulty of Paying Living Expenses: Not on file  Food Insecurity:   . Worried About Charity fundraiser in the Last Year: Not on file  . Ran Out of Food in the Last Year: Not on file  Transportation Needs:   . Lack of Transportation (Medical): Not on file  . Lack of Transportation (Non-Medical): Not on file  Physical Activity:   . Days of Exercise per Week: Not on file  . Minutes of Exercise per Session: Not on file  Stress:   . Feeling of Stress : Not on file  Social Connections:   . Frequency of Communication with Friends and Family: Not on file  . Frequency of Social Gatherings with Friends and Family: Not on file  . Attends Religious Services: Not on file  . Active Member of Clubs or Organizations: Not on file  . Attends Archivist  Meetings: Not on file  . Marital Status: Not on file    Allergies:  Allergies  Allergen Reactions  . Aspirin Other (See Comments)    Bruises easily   . Novocain [Procaine] Other (See Comments)    Syncope (passed out)     Objective:    Vital Signs:   Temp:  [97.8 F (36.6 C)] 97.8 F (36.6 C) (08/30 0636) Pulse Rate:  [78] 78 (08/30 0636) Resp:  [19] 19 (08/30 0636) BP: (124)/(83) 124/83 (08/30 0636) SpO2:  [97 %] 97 % (08/30 0636) Weight:  [104.8 kg] 104.8 kg (08/30 0636)   Filed Weights   06/06/20 0636  Weight: 104.8 kg     Physical Exam     General:  Well appearing. No respiratory difficulty HEENT: Normal Neck: Supple. no JVD. Carotids 2+ bilat; no bruits. No lymphadenopathy or thyromegaly appreciated. Cor: PMI nondisplaced. Irregular rate & rhythm. No rubs, gallops or murmurs. Lungs: Clear Abdomen: Soft, nontender, nondistended. No hepatosplenomegaly. No bruits or masses. Good bowel sounds. Extremities: No cyanosis, clubbing, rash, edema Neuro: Alert & oriented x 3, cranial nerves grossly intact. moves all 4 extremities w/o difficulty. Affect pleasant.   Telemetry   Atrial fibrillation 60s   Labs     Basic Metabolic Panel: Recent Labs  Lab 06/06/20 0654  NA 140  K 4.8  CL 106  GLUCOSE 138*  BUN 28*  CREATININE 1.90*    Liver Function Tests: No results for input(s): AST, ALT, ALKPHOS, BILITOT, PROT, ALBUMIN in the last 168 hours. No results for input(s): LIPASE, AMYLASE in the last 168 hours. No results for input(s): AMMONIA in the last 168 hours.  CBC: Recent Labs  Lab 06/06/20 0654  HGB 15.0  HCT 44.0    Cardiac Enzymes: No results for input(s): CKTOTAL, CKMB, CKMBINDEX, TROPONINI in the last 168 hours.  BNP: BNP (last 3 results) No results for input(s): BNP in the last 8760 hours.  ProBNP (last 3 results) No results for input(s): PROBNP  in the last 8760 hours.   CBG: No results for input(s): GLUCAP in the last 168  hours.  Coagulation Studies: No results for input(s): LABPROT, INR in the last 72 hours.  Imaging:  No results found.    Assessment/Plan   Patient is on Eliquis, admitted for DCCV.    [name], MD 06/06/2020, 7:58 AM  Advanced Heart Failure Team Pager [phone] (M-F; 7a - 4p)  Please contact [facility] for night-coverage after hours (4p -7a ) and weekends on [email or web address]

## 2020-06-06 NOTE — Anesthesia Procedure Notes (Signed)
Procedure Name: General with mask airway Date/Time: 06/06/2020 8:00 AM Performed by: Jenne Campus, CRNA Pre-anesthesia Checklist: Patient identified, Emergency Drugs available, Suction available and Patient being monitored Patient Re-evaluated:Patient Re-evaluated prior to induction Oxygen Delivery Method: Ambu bag Preoxygenation: Pre-oxygenation with 100% oxygen Induction Type: IV induction

## 2020-06-06 NOTE — Procedures (Signed)
Electrical Cardioversion Procedure Note Melvin Ramirez [id number] 08/02/1946  Procedure: Electrical Cardioversion Indications:  Atrial Fibrillation  Procedure Details Consent: Risks of procedure as well as the alternatives and risks of each were explained to the (patient/caregiver).  Consent for procedure obtained. Time Out: Verified patient identification, verified procedure, site/side was marked, verified correct patient position, special equipment/implants available, medications/allergies/relevent history reviewed, required imaging and test results available.  Performed  Patient placed on cardiac monitor, pulse oximetry, supplemental oxygen as necessary.  Sedation given: Propofol per anesthesiology Pacer pads placed anterior and posterior chest.  Cardioverted 1 time(s).  Cardioverted at [place].  Evaluation Findings: Post procedure EKG shows: NSR Complications: None Patient did tolerate procedure well.   [name] 06/06/2020, 8:04 AM

## 2020-06-06 NOTE — Discharge Instructions (Signed)
Electrical Cardioversion Electrical cardioversion is the delivery of a jolt of electricity to restore a normal rhythm to the heart. A rhythm that is too fast or is not regular keeps the heart from pumping well. In this procedure, sticky patches or metal paddles are placed on the chest to deliver electricity to the heart from a device. This procedure may be done in an emergency if:  There is low or no blood pressure as a result of the heart rhythm.  Normal rhythm must be restored as fast as possible to protect the brain and heart from further damage.  It may save a life. This may also be a scheduled procedure for irregular or fast heart rhythms that are not immediately life-threatening. Tell a health care provider about:  Any allergies you have.  All medicines you are taking, including vitamins, herbs, eye drops, creams, and over-the-counter medicines.  Any problems you or family members have had with anesthetic medicines.  Any blood disorders you have.  Any surgeries you have had.  Any medical conditions you have.  Whether you are pregnant or may be pregnant. What are the risks? Generally, this is a safe procedure. However, problems may occur, including:  Allergic reactions to medicines.  A blood clot that breaks free and travels to other parts of your body.  The possible return of an abnormal heart rhythm within hours or days after the procedure.  Your heart stopping (cardiac arrest). This is rare. What happens before the procedure? Medicines  Your health care provider may have you start taking: ? Blood-thinning medicines (anticoagulants) so your blood does not clot as easily. ? Medicines to help stabilize your heart rate and rhythm.  Ask your health care provider about: ? Changing or stopping your regular medicines. This is especially important if you are taking diabetes medicines or blood thinners. ? Taking medicines such as aspirin and ibuprofen. These medicines can  thin your blood. Do not take these medicines unless your health care provider tells you to take them. ? Taking over-the-counter medicines, vitamins, herbs, and supplements. General instructions  Follow instructions from your health care provider about eating or drinking restrictions.  Plan to have someone take you home from the hospital or clinic.  If you will be going home right after the procedure, plan to have someone with you for 24 hours.  Ask your health care provider what steps will be taken to help prevent infection. These may include washing your skin with a germ-killing soap. What happens during the procedure?   An IV will be inserted into one of your veins.  Sticky patches (electrodes) or metal paddles may be placed on your chest.  You will be given a medicine to help you relax (sedative).  An electrical shock will be delivered. The procedure may vary among health care providers and hospitals. What can I expect after the procedure?  Your blood pressure, heart rate, breathing rate, and blood oxygen level will be monitored until you leave the hospital or clinic.  Your heart rhythm will be watched to make sure it does not change.  You may have some redness on the skin where the shocks were given. Follow these instructions at home:  Do not drive for 24 hours if you were given a sedative during your procedure.  Take over-the-counter and prescription medicines only as told by your health care provider.  Ask your health care provider how to check your pulse. Check it often.  Rest for 48 hours after the procedure or   as told by your health care provider.  Avoid or limit your caffeine use as told by your health care provider.  Keep all follow-up visits as told by your health care provider. This is important. Contact a health care provider if:  You feel like your heart is beating too quickly or your pulse is not regular.  You have a serious muscle cramp that does not go  away. Get help right away if:  You have discomfort in your chest.  You are dizzy or you feel faint.  You have trouble breathing or you are short of breath.  Your speech is slurred.  You have trouble moving an arm or leg on one side of your body.  Your fingers or toes turn cold or blue. Summary  Electrical cardioversion is the delivery of a jolt of electricity to restore a normal rhythm to the heart.  This procedure may be done right away in an emergency or may be a scheduled procedure if the condition is not an emergency.  Generally, this is a safe procedure.  After the procedure, check your pulse often as told by your health care provider. This information is not intended to replace advice given to you by your health care provider. Make sure you discuss any questions you have with your health care provider. Document Revised: 04/27/2019 Document Reviewed: 04/27/2019 Elsevier Patient Education  2020 Elsevier Inc.  

## 2020-06-06 NOTE — Transfer of Care (Signed)
Immediate Anesthesia Transfer of Care Note  Patient: Melvin Ramirez  Procedure(s) Performed: CARDIOVERSION (N/A )  Patient Location: Endoscopy Unit  Anesthesia Type:General  Level of Consciousness: awake, oriented and patient cooperative  Airway & Oxygen Therapy: Patient Spontanous Breathing and Patient connected to nasal cannula oxygen  Post-op Assessment: Report given to RN and Post -op Vital signs reviewed and stable  Post vital signs: Reviewed  Last Vitals:  Vitals Value Taken Time  BP    Temp    Pulse    Resp    SpO2      Last Pain:  Vitals:   06/06/20 0636  TempSrc: Oral  PainSc: 0-No pain         Complications: No complications documented.

## 2020-06-06 NOTE — Anesthesia Postprocedure Evaluation (Signed)
Anesthesia Post Note  Patient: Melvin Ramirez  Procedure(s) Performed: CARDIOVERSION (N/A )     Patient location during evaluation: Endoscopy Anesthesia Type: General Level of consciousness: awake and alert Pain management: pain level controlled Vital Signs Assessment: post-procedure vital signs reviewed and stable Respiratory status: spontaneous breathing, nonlabored ventilation, respiratory function stable and patient connected to nasal cannula oxygen Cardiovascular status: blood pressure returned to baseline and stable Postop Assessment: no apparent nausea or vomiting Anesthetic complications: no   No complications documented.  Last Vitals:  Vitals:   06/06/20 0825 06/06/20 0830  BP: 110/72 105/67  Pulse: (!) 58 (!) 55  Resp: 15 14  Temp:    SpO2: 100% 100%    Last Pain:  Vitals:   06/06/20 0830  TempSrc:   PainSc: 0-No pain                 Seana Underwood L Forrest Jaroszewski

## 2020-06-07 ENCOUNTER — Encounter (HOSPITAL_COMMUNITY): Payer: Self-pay | Admitting: Cardiology

## 2020-06-19 ENCOUNTER — Other Ambulatory Visit (HOSPITAL_COMMUNITY): Payer: Self-pay | Admitting: Cardiology

## 2020-06-21 ENCOUNTER — Ambulatory Visit (HOSPITAL_COMMUNITY)
Admission: RE | Admit: 2020-06-21 | Discharge: 2020-06-21 | Disposition: A | Discharge status: Home / Self Care | Payer: Commercial Managed Care - PPO | Source: Ambulatory Visit | Attending: Cardiology | Admitting: Cardiology

## 2020-06-21 ENCOUNTER — Other Ambulatory Visit: Payer: Self-pay

## 2020-06-21 VITALS — BP 110/75 | HR 63 | Wt 238.4 lb

## 2020-06-21 DIAGNOSIS — I5042 Chronic combined systolic (congestive) and diastolic (congestive) heart failure: Secondary | ICD-10-CM

## 2020-06-21 DIAGNOSIS — E785 Hyperlipidemia, unspecified: Secondary | ICD-10-CM | POA: Diagnosis not present

## 2020-06-21 DIAGNOSIS — Z8616 Personal history of COVID-19: Secondary | ICD-10-CM | POA: Diagnosis not present

## 2020-06-21 DIAGNOSIS — I4819 Other persistent atrial fibrillation: Secondary | ICD-10-CM | POA: Insufficient documentation

## 2020-06-21 DIAGNOSIS — E1122 Type 2 diabetes mellitus with diabetic chronic kidney disease: Secondary | ICD-10-CM | POA: Diagnosis not present

## 2020-06-21 DIAGNOSIS — Z87891 Personal history of nicotine dependence: Secondary | ICD-10-CM | POA: Diagnosis not present

## 2020-06-21 DIAGNOSIS — Z86718 Personal history of other venous thrombosis and embolism: Secondary | ICD-10-CM | POA: Diagnosis not present

## 2020-06-21 DIAGNOSIS — Z905 Acquired absence of kidney: Secondary | ICD-10-CM | POA: Insufficient documentation

## 2020-06-21 DIAGNOSIS — E1140 Type 2 diabetes mellitus with diabetic neuropathy, unspecified: Secondary | ICD-10-CM | POA: Diagnosis not present

## 2020-06-21 DIAGNOSIS — Z7984 Long term (current) use of oral hypoglycemic drugs: Secondary | ICD-10-CM | POA: Insufficient documentation

## 2020-06-21 DIAGNOSIS — I5022 Chronic systolic (congestive) heart failure: Secondary | ICD-10-CM | POA: Insufficient documentation

## 2020-06-21 DIAGNOSIS — Z5181 Encounter for therapeutic drug level monitoring: Secondary | ICD-10-CM | POA: Diagnosis not present

## 2020-06-21 DIAGNOSIS — Z8711 Personal history of peptic ulcer disease: Secondary | ICD-10-CM | POA: Insufficient documentation

## 2020-06-21 DIAGNOSIS — Z85528 Personal history of other malignant neoplasm of kidney: Secondary | ICD-10-CM | POA: Insufficient documentation

## 2020-06-21 DIAGNOSIS — Z79899 Other long term (current) drug therapy: Secondary | ICD-10-CM | POA: Insufficient documentation

## 2020-06-21 DIAGNOSIS — Z8249 Family history of ischemic heart disease and other diseases of the circulatory system: Secondary | ICD-10-CM | POA: Insufficient documentation

## 2020-06-21 DIAGNOSIS — Z7901 Long term (current) use of anticoagulants: Secondary | ICD-10-CM | POA: Insufficient documentation

## 2020-06-21 DIAGNOSIS — N183 Chronic kidney disease, stage 3 unspecified: Secondary | ICD-10-CM | POA: Diagnosis not present

## 2020-06-21 LAB — DIGOXIN LEVEL: Digoxin Level: 0.7 ng/mL — ABNORMAL LOW (ref 0.8–2.0)

## 2020-06-21 LAB — CBC
HCT: 45.4 % (ref 39.0–52.0)
Hemoglobin: 14.5 g/dL (ref 13.0–17.0)
MCH: 31.1 pg (ref 26.0–34.0)
MCHC: 31.9 g/dL (ref 30.0–36.0)
MCV: 97.4 fL (ref 80.0–100.0)
Platelets: 200 10*3/uL (ref 150–400)
RBC: 4.66 MIL/uL (ref 4.22–5.81)
RDW: 14.6 % (ref 11.5–15.5)
WBC: 7.3 10*3/uL (ref 4.0–10.5)
nRBC: 0 % (ref 0.0–0.2)

## 2020-06-21 LAB — TSH: TSH: 2.307 u[IU]/mL (ref 0.350–4.500)

## 2020-06-21 LAB — COMPREHENSIVE METABOLIC PANEL
ALT: 20 U/L (ref 0–44)
AST: 21 U/L (ref 15–41)
Albumin: 3.6 g/dL (ref 3.5–5.0)
Alkaline Phosphatase: 60 U/L (ref 38–126)
Anion gap: 9 (ref 5–15)
BUN: 24 mg/dL — ABNORMAL HIGH (ref 8–23)
CO2: 21 mmol/L — ABNORMAL LOW (ref 22–32)
Calcium: 9 mg/dL (ref 8.9–10.3)
Chloride: 108 mmol/L (ref 98–111)
Creatinine, Ser: 1.99 mg/dL — ABNORMAL HIGH (ref 0.61–1.24)
GFR calc Af Amer: 38 mL/min — ABNORMAL LOW (ref >60)
GFR calc non Af Amer: 33 mL/min — ABNORMAL LOW (ref >60)
Glucose, Bld: 105 mg/dL — ABNORMAL HIGH (ref 70–99)
Potassium: 4.7 mmol/L (ref 3.5–5.1)
Sodium: 138 mmol/L (ref 135–145)
Total Bilirubin: 0.9 mg/dL (ref 0.3–1.2)
Total Protein: 6.3 g/dL — ABNORMAL LOW (ref 6.5–8.1)

## 2020-06-21 MED ORDER — AMIODARONE HCL 200 MG PO TABS: 200.0000 mg | ORAL_TABLET | Freq: Every day | ORAL | 3 refills | Status: DC

## 2020-06-21 MED ORDER — CARVEDILOL 6.25 MG PO TABS
9.3750 mg | ORAL_TABLET | Freq: Two times a day (BID) | ORAL | 6 refills | Status: DC
Start: 2020-06-21 — End: 2020-08-04

## 2020-06-21 NOTE — Progress Notes (Signed)
PCP: [name], MD Cardiology: Dr. Oval Linsey EP: Dr. Rayann Heman HF Cardiology: Dr. Aundra Dubin  74 y.o. with history of COVID-19 PNA, atrial fibrillation, and CHF was referred by Dr. Oval Linsey for evaluation of CHF.  Patient has history of renal cell carcinoma with right nephrectomy and diabetes, has CKD stage 3 as well.  He was feeling good until 1/21, when he developed COVID-19 PNA.  He was not hospitalized, but had a severe bout with it.  In 2/21, he was admitted with GI bleeding from gastric ulcers and new atrial fibrillation. He had EGD with clipping of ulcers.  Eventually, he was startedon Eliquis and had DCCV to NSR.  He additionally was noted to have RLE DVT in 2/21.  Echo in 2/21 hospitalization showed EF 25-30%.  Lexiscan Cardiolite showed inferior scar versus artifact and no ischemia.  Repeat echo in 3/21 showed EF still 30-35%.   He had ERAF after 3/21 DCCV and was re-admitted in 5/21 for Tikosyn initiation.  He had repeat DCCV to NSR this admission.  He was only on Tikosyn 125 mcg bid at time of discharge.  He went back into atrial fibrillation again and I cardioverted him to NSR later in 5/21.   Echo in 6/21 showed improved EF 40-45%, RV normal.    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  Tikosyn was stopped, Coreg was increased to 12.5 mg bid.    TEE in 8/21 showed EF 35-40%, global HK, mildly decreased RV function, no LAA thrombus.  Patient was cardioverted to NSR.   He returns for followup of CHF and atrial fibrillation.  He is back in atrial fibrillation today.  He does not feel it.  He uses torsemide occasionally, only about once a week.  No dyspnea with his normal activities.  He gets short of breath with moderate activities like push mowing.  No BRBPR/melena.  No chest pain.  No orthopnea/PND.  Weight is down 7 lbs.    ECG (personally reviewed): atrial fibrillation, LAFB  Labs (5/21): K 4.5,  creatinine 1.73 => 1.9, digoxin 0.9 Labs (6/21): K 5.2, creatinine 1.76 Labs (8/21): K 4.8, creatinine 1.9  PMH: 1. Atrial fibrillation: Paroxysmal.  - DCCV to NSR in 3/21, ERAF.   - Admitted in 5/21 for Tikosyn intiation and repeat DCCV to NSR.  - LAA thrombus in 7/21, atrial fibrillation ablation not done and Tikosyn stopped.  - Amiodarone started, repeat TEE in 8/21 with no thrombus and DCCV to NSR.  2. 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 Renal cell carcinoma s/p right nephrectomy 4. CKD: Stage 3. 5. Hyperlipidemia.  6. H/o COVID-19 PNA in 1/21.  7. H/o GI bleeding from gastric ulcers in 3/21.  8. Cardiomyopathy: Echo (2/21) with EF 25-30%.  - Cardiolite (2/21) with inferior scar, no ischemia.  - Echo (3/21): EF 30-35%, global hypokinesis, mild MR.  - Echo (6/21): EF 40-45%, RV normal size and systolic function, PASP 36, IVC dilated.  - TEE (8/21): EF 35-40%, diffuse hypokinesis, mildly decreased RV systolic function.  9. RLE DVT in 2/21 post-COVID-19 PNA.   Social History   Socioeconomic History  . Marital status: Married    Spouse name: Not on file  . Number of children: Not on file  . Years of education: Not on file  . Highest education level: Not on file  Occupational History  . Occupation: truck Geophysicist/field seismologist  Tobacco Use  . Smoking status:  Former Smoker    Packs/day: 2.00    Years: 35.00    Pack years: 70.00    Types: Cigarettes    Quit date: 2000    Years since quitting: 21.7  . Smokeless tobacco: Never Used  Vaping Use  . Vaping Use: Never used  Substance and Sexual Activity  . Alcohol use: Yes    Alcohol/week: 1.0 - 2.0 standard drink    Types: 1 - 2 Cans of beer per week    Comment: Socially  . Drug use: No  . Sexual activity: Not on file  Other Topics Concern  . Not on file  Social History Narrative   Lives with wife in a one story home.  Has 3 children.     Works as a Administrator.     Education: 12th grade.   Social Determinants of Health    Financial Resource Strain:   . Difficulty of Paying Living Expenses: Not on file  Food Insecurity:   . Worried About Charity fundraiser in the Last Year: Not on file  . Ran Out of Food in the Last Year: Not on file  Transportation Needs:   . Lack of Transportation (Medical): Not on file  . Lack of Transportation (Non-Medical): Not on file  Physical Activity:   . Days of Exercise per Week: Not on file  . Minutes of Exercise per Session: Not on file  Stress:   . Feeling of Stress : Not on file  Social Connections:   . Frequency of Communication with Friends and Family: Not on file  . Frequency of Social Gatherings with Friends and Family: Not on file  . Attends Religious Services: Not on file  . Active Member of Clubs or Organizations: Not on file  . Attends Archivist Meetings: Not on file  . Marital Status: Not on file  Intimate Partner Violence:   . Fear of Current or Ex-Partner: Not on file  . Emotionally Abused: Not on file  . Physically Abused: Not on file  . Sexually Abused: Not on file   Family History  Problem Relation Age of Onset  . Cancer Mother   . Heart attack Father   . Heart attack Brother   . Heart disease Sister   . Stroke Paternal Grandmother    ROS: All systems reviewed and negative except as per HPI.   Current Outpatient Medications  Medication Sig Dispense Refill  . ACCU-CHEK GUIDE test strip daily. for testing    . Accu-Chek Softclix Lancets lancets     . acetaminophen (TYLENOL) 500 MG tablet Take 1,000 mg by mouth every 6 (six) hours as needed for mild pain or headache.    Marland Kitchen amiodarone (PACERONE) 200 MG tablet Take 1 tablet (200 mg total) by mouth daily. 60 tablet 3  . ascorbic acid (SM VITAMIN C) 1000 MG tablet Take 1,000 mg by mouth every evening.     Marland Kitchen atorvastatin (LIPITOR) 10 MG tablet Take 1 tablet (10 mg total) by mouth daily. 30 tablet 6  . Blood Glucose Monitoring Suppl (ACCU-CHEK GUIDE ME) w/Device KIT See admin instructions.     . carvedilol (COREG) 6.25 MG tablet Take 1.5 tablets (9.375 mg total) by mouth 2 (two) times daily with a meal. 90 tablet 6  . dapagliflozin propanediol (FARXIGA) 10 MG TABS tablet Take 1 tablet (10 mg total) by mouth daily before breakfast. 30 tablet 6  . digoxin (LANOXIN) 0.125 MG tablet Take 0.5 tablets (0.0625 mg total) by mouth  daily. 90 tablet 3  . ELIQUIS 5 MG TABS tablet TAKE 1 TABLET BY MOUTH TWICE A DAY 60 tablet 11  . Garlic 2500 MG CAPS Take 1,000 mg by mouth every evening.    . Glucosamine HCl 1000 MG TABS Take 1,000 mg by mouth every evening.    . Multiple Vitamin (MULTIVITAMIN WITH MINERALS) TABS tablet Take 1 tablet by mouth daily with breakfast.     . Omega-3 Fatty Acids (FISH OIL PO) Take 350 mg by mouth every evening.     . pantoprazole (PROTONIX) 40 MG tablet Take 40 mg by mouth daily.    . sacubitril-valsartan (ENTRESTO) 24-26 MG Take 1 tablet by mouth 2 (two) times daily. 60 tablet 5  . Saw Palmetto, Serenoa repens, (SAW PALMETTO PO) Take 540 mg by mouth every evening.     . torsemide (DEMADEX) 10 MG tablet Take 10 mg by mouth daily as needed (fluid).     No current facility-administered medications for this encounter.   BP 110/75   Pulse 63   Wt 108.1 kg (238 lb 6.4 oz)   SpO2 97%   BMI 31.45 kg/m  General: NAD Neck: No JVD, no thyromegaly or thyroid nodule.  Lungs: Clear to auscultation bilaterally with normal respiratory effort. CV: Nondisplaced PMI.  Heart irregular S1/S2, no S3/S4, no murmur.  No peripheral edema.  No carotid bruit.  Normal pedal pulses.  Abdomen: Soft, nontender, no hepatosplenomegaly, no distention.  Skin: Intact without lesions or rashes.  Neurologic: Alert and oriented x 3.  Psych: Normal affect. Extremities: No clubbing or cyanosis.  HEENT: Normal.   Assessment/Plan: 1. Chronic systolic CHF: Echo in 3/70 with EF 25-30%, echo in 3/21 with EF 30-35%.  Cardiolite with inferior scar vs artifact, no ischemia. Cause of cardiomyopathy is  uncertain.  Could be related to atrial fibrillation (though not currently in RVR).  Have not ruled out CAD or cardiomyopathy related to myocarditis (?COVID-19 myocarditis).  Repeat echo in 6/21 showed EF up to 40-45%.  TEE in 8/21 with EF 35-40%.  NYHA class II symptoms, he is not volume overloaded on exam. Medication titration limited by soft blood pressure.  - Increase Coreg to 9.375 mg bid.  - Continue Entresto 24/26 bid.  - He uses torsemide prn.  - Continue digoxin, check level today.    - Will not start spironolactone right now as K has been mildly elevated. Need to check BMET today.  - Continue dapagliflozin 10 mg daily.   - He will need eventual RHC/LHC if EF does not return to normal after keeping him in NSR, this will be complicated by elevated creatinine.   2. Atrial fibrillation: Persistent.  He is back in atrial fibrillation despite DCCV x 2 on Tikosyn and DCCV x 1 on amiodarone.  I would like to get him in NSR to see if this helps improve his LV function.   - He is now taking Eliquis 5 mg bid without missing doses.  - Decrease amiodarone to 200 mg daily.  Will continue for now.  LFTs, TSH today.  He will need a regular eye exam.  - I will talk with Dr. Rayann Heman again about atrial fibrillation ablation for this patient.  3. CKD: Stage 3.  BMET today.  Followup 2 months but will discuss ablation with Dr. Rayann Heman.   Melvin Ramirez 06/21/2020

## 2020-06-21 NOTE — H&P (View-Only) (Signed)
PCP: [name], MD Cardiology: Dr. Oval Linsey EP: Dr. Rayann Heman HF Cardiology: Dr. Aundra Dubin  74 y.o. with history of COVID-19 PNA, atrial fibrillation, and CHF was referred by Dr. Oval Linsey for evaluation of CHF.  Patient has history of renal cell carcinoma with right nephrectomy and diabetes, has CKD stage 3 as well.  He was feeling good until 1/21, when he developed COVID-19 PNA.  He was not hospitalized, but had a severe bout with it.  In 2/21, he was admitted with GI bleeding from gastric ulcers and new atrial fibrillation. He had EGD with clipping of ulcers.  Eventually, he was startedon Eliquis and had DCCV to NSR.  He additionally was noted to have RLE DVT in 2/21.  Echo in 2/21 hospitalization showed EF 25-30%.  Lexiscan Cardiolite showed inferior scar versus artifact and no ischemia.  Repeat echo in 3/21 showed EF still 30-35%.   He had ERAF after 3/21 DCCV and was re-admitted in 5/21 for Tikosyn initiation.  He had repeat DCCV to NSR this admission.  He was only on Tikosyn 125 mcg bid at time of discharge.  He went back into atrial fibrillation again and I cardioverted him to NSR later in 5/21.   Echo in 6/21 showed improved EF 40-45%, RV normal.    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  Tikosyn was stopped, Coreg was increased to 12.5 mg bid.    TEE in 8/21 showed EF 35-40%, global HK, mildly decreased RV function, no LAA thrombus.  Patient was cardioverted to NSR.   He returns for followup of CHF and atrial fibrillation.  He is back in atrial fibrillation today.  He does not feel it.  He uses torsemide occasionally, only about once a week.  No dyspnea with his normal activities.  He gets short of breath with moderate activities like push mowing.  No BRBPR/melena.  No chest pain.  No orthopnea/PND.  Weight is down 7 lbs.    ECG (personally reviewed): atrial fibrillation, LAFB  Labs (5/21): K 4.5,  creatinine 1.73 => 1.9, digoxin 0.9 Labs (6/21): K 5.2, creatinine 1.76 Labs (8/21): K 4.8, creatinine 1.9  PMH: 1. Atrial fibrillation: Paroxysmal.  - DCCV to NSR in 3/21, ERAF.   - Admitted in 5/21 for Tikosyn intiation and repeat DCCV to NSR.  - LAA thrombus in 7/21, atrial fibrillation ablation not done and Tikosyn stopped.  - Amiodarone started, repeat TEE in 8/21 with no thrombus and DCCV to NSR.  2. 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 Renal cell carcinoma s/p right nephrectomy 4. CKD: Stage 3. 5. Hyperlipidemia.  6. H/o COVID-19 PNA in 1/21.  7. H/o GI bleeding from gastric ulcers in 3/21.  8. Cardiomyopathy: Echo (2/21) with EF 25-30%.  - Cardiolite (2/21) with inferior scar, no ischemia.  - Echo (3/21): EF 30-35%, global hypokinesis, mild MR.  - Echo (6/21): EF 40-45%, RV normal size and systolic function, PASP 36, IVC dilated.  - TEE (8/21): EF 35-40%, diffuse hypokinesis, mildly decreased RV systolic function.  9. RLE DVT in 2/21 post-COVID-19 PNA.   Social History   Socioeconomic History  . Marital status: Married    Spouse name: Not on file  . Number of children: Not on file  . Years of education: Not on file  . Highest education level: Not on file  Occupational History  . Occupation: truck Geophysicist/field seismologist  Tobacco Use  . Smoking status:  Former Smoker    Packs/day: 2.00    Years: 35.00    Pack years: 70.00    Types: Cigarettes    Quit date: 2000    Years since quitting: 21.7  . Smokeless tobacco: Never Used  Vaping Use  . Vaping Use: Never used  Substance and Sexual Activity  . Alcohol use: Yes    Alcohol/week: 1.0 - 2.0 standard drink    Types: 1 - 2 Cans of beer per week    Comment: Socially  . Drug use: No  . Sexual activity: Not on file  Other Topics Concern  . Not on file  Social History Narrative   Lives with wife in a one story home.  Has 3 children.     Works as a Administrator.     Education: 12th grade.   Social Determinants of Health    Financial Resource Strain:   . Difficulty of Paying Living Expenses: Not on file  Food Insecurity:   . Worried About Charity fundraiser in the Last Year: Not on file  . Ran Out of Food in the Last Year: Not on file  Transportation Needs:   . Lack of Transportation (Medical): Not on file  . Lack of Transportation (Non-Medical): Not on file  Physical Activity:   . Days of Exercise per Week: Not on file  . Minutes of Exercise per Session: Not on file  Stress:   . Feeling of Stress : Not on file  Social Connections:   . Frequency of Communication with Friends and Family: Not on file  . Frequency of Social Gatherings with Friends and Family: Not on file  . Attends Religious Services: Not on file  . Active Member of Clubs or Organizations: Not on file  . Attends Archivist Meetings: Not on file  . Marital Status: Not on file  Intimate Partner Violence:   . Fear of Current or Ex-Partner: Not on file  . Emotionally Abused: Not on file  . Physically Abused: Not on file  . Sexually Abused: Not on file   Family History  Problem Relation Age of Onset  . Cancer Mother   . Heart attack Father   . Heart attack Brother   . Heart disease Sister   . Stroke Paternal Grandmother    ROS: All systems reviewed and negative except as per HPI.   Current Outpatient Medications  Medication Sig Dispense Refill  . ACCU-CHEK GUIDE test strip daily. for testing    . Accu-Chek Softclix Lancets lancets     . acetaminophen (TYLENOL) 500 MG tablet Take 1,000 mg by mouth every 6 (six) hours as needed for mild pain or headache.    Marland Kitchen amiodarone (PACERONE) 200 MG tablet Take 1 tablet (200 mg total) by mouth daily. 60 tablet 3  . ascorbic acid (SM VITAMIN C) 1000 MG tablet Take 1,000 mg by mouth every evening.     Marland Kitchen atorvastatin (LIPITOR) 10 MG tablet Take 1 tablet (10 mg total) by mouth daily. 30 tablet 6  . Blood Glucose Monitoring Suppl (ACCU-CHEK GUIDE ME) w/Device KIT See admin instructions.     . carvedilol (COREG) 6.25 MG tablet Take 1.5 tablets (9.375 mg total) by mouth 2 (two) times daily with a meal. 90 tablet 6  . dapagliflozin propanediol (FARXIGA) 10 MG TABS tablet Take 1 tablet (10 mg total) by mouth daily before breakfast. 30 tablet 6  . digoxin (LANOXIN) 0.125 MG tablet Take 0.5 tablets (0.0625 mg total) by mouth  daily. 90 tablet 3  . ELIQUIS 5 MG TABS tablet TAKE 1 TABLET BY MOUTH TWICE A DAY 60 tablet 11  . Garlic 2500 MG CAPS Take 1,000 mg by mouth every evening.    . Glucosamine HCl 1000 MG TABS Take 1,000 mg by mouth every evening.    . Multiple Vitamin (MULTIVITAMIN WITH MINERALS) TABS tablet Take 1 tablet by mouth daily with breakfast.     . Omega-3 Fatty Acids (FISH OIL PO) Take 350 mg by mouth every evening.     . pantoprazole (PROTONIX) 40 MG tablet Take 40 mg by mouth daily.    . sacubitril-valsartan (ENTRESTO) 24-26 MG Take 1 tablet by mouth 2 (two) times daily. 60 tablet 5  . Saw Palmetto, Serenoa repens, (SAW PALMETTO PO) Take 540 mg by mouth every evening.     . torsemide (DEMADEX) 10 MG tablet Take 10 mg by mouth daily as needed (fluid).     No current facility-administered medications for this encounter.   BP 110/75   Pulse 63   Wt 108.1 kg (238 lb 6.4 oz)   SpO2 97%   BMI 31.45 kg/m  General: NAD Neck: No JVD, no thyromegaly or thyroid nodule.  Lungs: Clear to auscultation bilaterally with normal respiratory effort. CV: Nondisplaced PMI.  Heart irregular S1/S2, no S3/S4, no murmur.  No peripheral edema.  No carotid bruit.  Normal pedal pulses.  Abdomen: Soft, nontender, no hepatosplenomegaly, no distention.  Skin: Intact without lesions or rashes.  Neurologic: Alert and oriented x 3.  Psych: Normal affect. Extremities: No clubbing or cyanosis.  HEENT: Normal.   Assessment/Plan: 1. Chronic systolic CHF: Echo in 3/70 with EF 25-30%, echo in 3/21 with EF 30-35%.  Cardiolite with inferior scar vs artifact, no ischemia. Cause of cardiomyopathy is  uncertain.  Could be related to atrial fibrillation (though not currently in RVR).  Have not ruled out CAD or cardiomyopathy related to myocarditis (?COVID-19 myocarditis).  Repeat echo in 6/21 showed EF up to 40-45%.  TEE in 8/21 with EF 35-40%.  NYHA class II symptoms, he is not volume overloaded on exam. Medication titration limited by soft blood pressure.  - Increase Coreg to 9.375 mg bid.  - Continue Entresto 24/26 bid.  - He uses torsemide prn.  - Continue digoxin, check level today.    - Will not start spironolactone right now as K has been mildly elevated. Need to check BMET today.  - Continue dapagliflozin 10 mg daily.   - He will need eventual RHC/LHC if EF does not return to normal after keeping him in NSR, this will be complicated by elevated creatinine.   2. Atrial fibrillation: Persistent.  He is back in atrial fibrillation despite DCCV x 2 on Tikosyn and DCCV x 1 on amiodarone.  I would like to get him in NSR to see if this helps improve his LV function.   - He is now taking Eliquis 5 mg bid without missing doses.  - Decrease amiodarone to 200 mg daily.  Will continue for now.  LFTs, TSH today.  He will need a regular eye exam.  - I will talk with Dr. Rayann Heman again about atrial fibrillation ablation for this patient.  3. CKD: Stage 3.  BMET today.  Followup 2 months but will discuss ablation with Dr. Rayann Heman.   [name] 06/21/2020

## 2020-06-21 NOTE — Patient Instructions (Signed)
Decrease Amiodarone to 200 mg Daily  Increase Carvedilol to 9.375 mg (1 & 1/2 tabs) Twice daily   Labs done today, your results will be available in MyChart, we will contact you for abnormal readings.  Your physician recommends that you schedule a follow-up appointment in: 2 months  If you have any questions or concerns before your next appointment please send [place] a message through [place] or call our office at [phone].    TO LEAVE A MESSAGE FOR THE NURSE SELECT OPTION 2, PLEASE LEAVE A MESSAGE INCLUDING:  YOUR NAME  DATE OF BIRTH  CALL BACK NUMBER  REASON FOR CALL**this is important as we prioritize the call backs  YOU WILL RECEIVE A CALL BACK THE SAME DAY AS LONG AS YOU CALL BEFORE 4:00 PM  At the [facility], you and your health needs are our priority. As part of our continuing mission to provide you with exceptional heart care, we have created designated Provider Care Teams. These Care Teams include your primary Cardiologist (physician) and Advanced Practice Providers (APPs- Physician Assistants and Nurse Practitioners) who all work together to provide you with the care you need, when you need it.   You may see any of the following providers on your designated Care Team at your next follow up:  Dr Glori Bickers  Dr Haynes Kerns, NP  Lyda Jester, [place]  Audry Riles, PharmD   Please be sure to bring in all your medications bottles to every appointment.

## 2020-06-30 ENCOUNTER — Encounter (HOSPITAL_COMMUNITY): Payer: Self-pay

## 2020-07-11 ENCOUNTER — Encounter: Payer: Self-pay | Admitting: Internal Medicine

## 2020-07-11 ENCOUNTER — Other Ambulatory Visit: Payer: Self-pay

## 2020-07-11 ENCOUNTER — Ambulatory Visit (INDEPENDENT_AMBULATORY_CARE_PROVIDER_SITE_OTHER): Payer: Commercial Managed Care - PPO | Admitting: Internal Medicine

## 2020-07-11 VITALS — BP 96/60 | HR 75 | Ht 73.0 in | Wt 234.0 lb

## 2020-07-11 DIAGNOSIS — I428 Other cardiomyopathies: Secondary | ICD-10-CM | POA: Diagnosis not present

## 2020-07-11 DIAGNOSIS — I4891 Unspecified atrial fibrillation: Secondary | ICD-10-CM | POA: Diagnosis not present

## 2020-07-11 DIAGNOSIS — I5032 Chronic diastolic (congestive) heart failure: Secondary | ICD-10-CM | POA: Diagnosis not present

## 2020-07-11 NOTE — Progress Notes (Signed)
PCP: [name], MD Primary Cardiologist: Dr Aundra Dubin Primary EP: Dr Hassell Halim is a 74 y.o. male who presents today for routine electrophysiology followup.  Since last being seen in our clinic, the patient reports doing poorly.  He has fatigue and decreased exercise tolerance with afib.  + episodes of chest pain.  Today, he denies symptoms of palpitations, lower extremity edema, dizziness, presyncope, or syncope.  The patient is otherwise without complaint today.   Past Medical History:  Diagnosis Date   Chronic combined systolic and diastolic heart failure ([place]) 12/13/2019   Chronic kidney disease    CKD (chronic kidney disease) stage 3, GFR 30-59 ml/min ([place]) 12/13/2019   Diabetes mellitus ([place])    Gastric ulcer 12/13/2019   +H. Pylori. Bleeding required clipping.   H/O blood clots    History of kidney cancer    Persistent atrial fibrillation ([place]) 12/13/2019   Renal cell carcinoma ([place]) 12/13/2019   S/p nephrectomy   Past Surgical History:  Procedure Laterality Date   APPENDECTOMY     ATRIAL FIBRILLATION ABLATION N/A 04/07/2020   Procedure: ATRIAL FIBRILLATION ABLATION;  Surgeon: [name], MD;  Location: [place] [facility];  Service: Cardiovascular;  Laterality: N/A;   BIOPSY  11/19/2019   Procedure: BIOPSY;  Surgeon: Ronald Lobo, MD;  Location: [place];  Service: Endoscopy;;   CARDIOVERSION N/A 12/17/2019   Procedure: CARDIOVERSION;  Surgeon: Geralynn Rile, MD;  Location: [place];  Service: Cardiovascular;  Laterality: N/A;   CARDIOVERSION N/A 02/18/2020   Procedure: CARDIOVERSION;  Surgeon: Jerline Pain, MD;  Location: [facility] ENDOSCOPY;  Service: Cardiovascular;  Laterality: N/A;   CARDIOVERSION N/A 02/29/2020   Procedure: CARDIOVERSION;  Surgeon: Larey Dresser, MD;  Location: [facility] ENDOSCOPY;  Service: Cardiovascular;  Laterality: N/A;   CARDIOVERSION N/A 06/06/2020   Procedure: CARDIOVERSION;  Surgeon: Larey Dresser, MD;  Location:  [facility] ENDOSCOPY;  Service: Cardiovascular;  Laterality: N/A;   ESOPHAGOGASTRODUODENOSCOPY N/A 11/19/2019   Procedure: ESOPHAGOGASTRODUODENOSCOPY (EGD);  Surgeon: Ronald Lobo, MD;  Location: [facility] ENDOSCOPY;  Service: Endoscopy;  Laterality: N/A;   HEMOSTASIS CLIP PLACEMENT  11/19/2019   Procedure: HEMOSTASIS CLIP PLACEMENT;  Surgeon: Ronald Lobo, MD;  Location: [place];  Service: Endoscopy;;   NEPHRECTOMY     TEE WITHOUT CARDIOVERSION N/A 05/18/2020   Procedure: TRANSESOPHAGEAL ECHOCARDIOGRAM (TEE);  Surgeon: Larey Dresser, MD;  Location: [facility] ENDOSCOPY;  Service: Cardiovascular;  Laterality: N/A;    ROS- all systems are reviewed and negatives except as per HPI above  Current Outpatient Medications  Medication Sig Dispense Refill   ACCU-CHEK GUIDE test strip daily. for testing     Accu-Chek Softclix Lancets lancets      acetaminophen (TYLENOL) 500 MG tablet Take 1,000 mg by mouth every 6 (six) hours as needed for mild pain or headache.     amiodarone (PACERONE) 200 MG tablet Take 1 tablet (200 mg total) by mouth daily. 60 tablet 3   ascorbic acid (SM VITAMIN C) 1000 MG tablet Take 1,000 mg by mouth every evening.      atorvastatin (LIPITOR) 10 MG tablet Take 1 tablet (10 mg total) by mouth daily. 30 tablet 6   Blood Glucose Monitoring Suppl (ACCU-CHEK GUIDE ME) w/Device KIT See admin instructions.     carvedilol (COREG) 6.25 MG tablet Take 1.5 tablets (9.375 mg total) by mouth 2 (two) times daily with a meal. 90 tablet 6   dapagliflozin propanediol (FARXIGA) 10 MG TABS tablet Take 1 tablet (10 mg total)  by mouth daily before breakfast. 30 tablet 6   digoxin (LANOXIN) 0.125 MG tablet Take 0.5 tablets (0.0625 mg total) by mouth daily. 90 tablet 3   ELIQUIS 5 MG TABS tablet TAKE 1 TABLET BY MOUTH TWICE A DAY 60 tablet 11   Garlic 3568 MG CAPS Take 1,000 mg by mouth every evening.     Glucosamine HCl 1000 MG TABS Take 1,000 mg by mouth every evening.     Multiple Vitamin  (MULTIVITAMIN WITH MINERALS) TABS tablet Take 1 tablet by mouth daily with breakfast.      Omega-3 Fatty Acids (FISH OIL PO) Take 350 mg by mouth every evening.      pantoprazole (PROTONIX) 40 MG tablet Take 40 mg by mouth daily.     sacubitril-valsartan (ENTRESTO) 24-26 MG Take 1 tablet by mouth 2 (two) times daily. 60 tablet 5   Saw Palmetto, Serenoa repens, (SAW PALMETTO PO) Take 540 mg by mouth every evening.      torsemide (DEMADEX) 10 MG tablet Take 10 mg by mouth daily as needed (fluid).     No current facility-administered medications for this visit.    Physical Exam: Vitals:   07/11/20 0956  BP: 96/60  Pulse: 75  SpO2: 95%  Weight: 234 lb (106.1 kg)  Height: 6' 1"  (1.854 m)    GEN- The patient is well appearing, alert and oriented x 3 today.   Head- normocephalic, atraumatic Eyes-  Sclera clear, conjunctiva pink Ears- hearing intact Oropharynx- clear Lungs- Clear to ausculation bilaterally, normal work of breathing Heart- irregular rate and rhythm  GI- soft, NT, ND, + BS Extremities- no clubbing, cyanosis, or edema  Wt Readings from Last 3 Encounters:  07/11/20 234 lb (106.1 kg)  06/21/20 238 lb 6.4 oz (108.1 kg)  06/06/20 231 lb (104.8 kg)    EKG tracing ordered today is personally reviewed and shows rate controlled afib  Assessment and Plan:  1. Persistent afib Occurs in the setting of severe LA enlargement, poor medical compliance and prior LAA thrombus.  chadsvasc score is 3.  He is on eliquis. He has failed medical therapy with tikosyn and amiodarone. I do not think that PVI is likely to be successful.  Given reduced EF, I do think that he would do better in sinus.  I will refer to Dr Roxy Manns for consideration of MAZE with LAA removal.  2. Nonischemic CM/ chronic systolic dysfunction Likely tachycardia mediated I do think that he would do better with sinus rhythm long term.   3. SOB/ fatigue/ CP He has ongoing issues with SOB, fatigue and chest  pain. myoview 3/21 was moderate in risks.  He worries about possible CAD.  I have therefore advised cath.  Given baseline creatinine of 2.  He will likely require premedication.  I will discuss with Dr Aundra Dubin the possibility of [place] prior to MAZE.   Risks, benefits and potential toxicities for medications prescribed and/or refilled reviewed with patient today.   Refer to Dr Roxy Manns Follow-up in AF clinic and CHF clinic  [name] MD, [facility] 07/11/2020 10:06 AM

## 2020-07-11 NOTE — Patient Instructions (Addendum)
Medication Instructions:  Your physician recommends that you continue on your current medications as directed. Please refer to the Current Medication list given to you today.  *If you need a refill on your cardiac medications before your next appointment, please call your pharmacy*  Lab Work: None ordered.  If you have labs (blood work) drawn today and your tests are completely normal, you will receive your results only by: Marland Kitchen MyChart Message (if you have MyChart) OR . A paper copy in the mail If you have any lab test that is abnormal or we need to change your treatment, we will call you to review the results.  Testing/Procedures: Left and Right heart cath with Dr. Aundra Dubin, we will reach out to you with his schedule.   Follow-Up: At [facility], you and your health needs are our priority.  As part of our continuing mission to provide you with exceptional heart care, we have created designated Provider Care Teams.  These Care Teams include your primary Cardiologist (physician) and Advanced Practice Providers (APPs -  Physician Assistants and Nurse Practitioners) who all work together to provide you with the care you need, when you need it.  We recommend signing up for the patient portal called "MyChart".  Sign up information is provided on this After Visit Summary.  MyChart is used to connect with patients for Virtual Visits (Telemedicine).  Patients are able to view lab/test results, encounter notes, upcoming appointments, etc.  Non-urgent messages can be sent to your provider as well.   To learn more about what you can do with MyChart, go to [email or web address].    Your next appointment:   Your physician wants you to follow-up in: Afib Clinic in 2 months they will reach out to you.    Other Instructions:

## 2020-07-12 ENCOUNTER — Other Ambulatory Visit (HOSPITAL_COMMUNITY): Payer: Self-pay | Admitting: *Deleted

## 2020-07-12 DIAGNOSIS — R079 Chest pain, unspecified: Secondary | ICD-10-CM

## 2020-07-18 ENCOUNTER — Other Ambulatory Visit (HOSPITAL_COMMUNITY)
Admission: RE | Admit: 2020-07-18 | Discharge: 2020-07-18 | Disposition: A | Discharge status: Home / Self Care | Payer: Commercial Managed Care - PPO | Source: Ambulatory Visit | Attending: Cardiology | Admitting: Cardiology

## 2020-07-18 DIAGNOSIS — Z01812 Encounter for preprocedural laboratory examination: Secondary | ICD-10-CM | POA: Insufficient documentation

## 2020-07-18 DIAGNOSIS — Z20822 Contact with and (suspected) exposure to covid-19: Secondary | ICD-10-CM | POA: Diagnosis not present

## 2020-07-18 LAB — SARS CORONAVIRUS 2 (TAT 6-24 HRS): SARS Coronavirus 2: NEGATIVE

## 2020-07-21 ENCOUNTER — Ambulatory Visit (HOSPITAL_COMMUNITY)
Admission: RE | Admit: 2020-07-21 | Discharge: 2020-07-21 | Disposition: A | Discharge status: Home / Self Care | Payer: Commercial Managed Care - PPO | Attending: Cardiology | Admitting: Cardiology

## 2020-07-21 ENCOUNTER — Encounter (HOSPITAL_COMMUNITY)
Admission: RE | Disposition: A | Discharge status: Home / Self Care | Payer: Self-pay | Source: Home / Self Care | Attending: Cardiology

## 2020-07-21 DIAGNOSIS — I4819 Other persistent atrial fibrillation: Secondary | ICD-10-CM | POA: Diagnosis not present

## 2020-07-21 DIAGNOSIS — Z7984 Long term (current) use of oral hypoglycemic drugs: Secondary | ICD-10-CM | POA: Insufficient documentation

## 2020-07-21 DIAGNOSIS — Z86718 Personal history of other venous thrombosis and embolism: Secondary | ICD-10-CM | POA: Insufficient documentation

## 2020-07-21 DIAGNOSIS — N183 Chronic kidney disease, stage 3 unspecified: Secondary | ICD-10-CM | POA: Insufficient documentation

## 2020-07-21 DIAGNOSIS — Z8616 Personal history of COVID-19: Secondary | ICD-10-CM | POA: Diagnosis not present

## 2020-07-21 DIAGNOSIS — E1122 Type 2 diabetes mellitus with diabetic chronic kidney disease: Secondary | ICD-10-CM | POA: Diagnosis not present

## 2020-07-21 DIAGNOSIS — I429 Cardiomyopathy, unspecified: Secondary | ICD-10-CM | POA: Insufficient documentation

## 2020-07-21 DIAGNOSIS — Z85528 Personal history of other malignant neoplasm of kidney: Secondary | ICD-10-CM | POA: Diagnosis not present

## 2020-07-21 DIAGNOSIS — Z79899 Other long term (current) drug therapy: Secondary | ICD-10-CM | POA: Insufficient documentation

## 2020-07-21 DIAGNOSIS — R079 Chest pain, unspecified: Secondary | ICD-10-CM

## 2020-07-21 DIAGNOSIS — I130 Hypertensive heart and chronic kidney disease with heart failure and stage 1 through stage 4 chronic kidney disease, or unspecified chronic kidney disease: Secondary | ICD-10-CM | POA: Insufficient documentation

## 2020-07-21 DIAGNOSIS — Z87891 Personal history of nicotine dependence: Secondary | ICD-10-CM | POA: Insufficient documentation

## 2020-07-21 DIAGNOSIS — I509 Heart failure, unspecified: Secondary | ICD-10-CM | POA: Diagnosis not present

## 2020-07-21 DIAGNOSIS — I5022 Chronic systolic (congestive) heart failure: Secondary | ICD-10-CM | POA: Diagnosis not present

## 2020-07-21 DIAGNOSIS — Z905 Acquired absence of kidney: Secondary | ICD-10-CM | POA: Diagnosis not present

## 2020-07-21 DIAGNOSIS — E785 Hyperlipidemia, unspecified: Secondary | ICD-10-CM | POA: Insufficient documentation

## 2020-07-21 DIAGNOSIS — Z7901 Long term (current) use of anticoagulants: Secondary | ICD-10-CM | POA: Insufficient documentation

## 2020-07-21 HISTORY — PX: RIGHT/LEFT HEART CATH AND CORONARY ANGIOGRAPHY: CATH118266

## 2020-07-21 LAB — POCT I-STAT EG7
Acid-base deficit: 3 mmol/L — ABNORMAL HIGH (ref 0.0–2.0)
Acid-base deficit: 3 mmol/L — ABNORMAL HIGH (ref 0.0–2.0)
Bicarbonate: 22.5 mmol/L (ref 20.0–28.0)
Bicarbonate: 22.3 mmol/L (ref 20.0–28.0)
Calcium, Ion: 1.27 mmol/L (ref 1.15–1.40)
Calcium, Ion: 1.22 mmol/L (ref 1.15–1.40)
HCT: 41 % (ref 39.0–52.0)
HCT: 40 % (ref 39.0–52.0)
Hemoglobin: 13.9 g/dL (ref 13.0–17.0)
Hemoglobin: 13.6 g/dL (ref 13.0–17.0)
O2 Saturation: 63 %
O2 Saturation: 63 %
Potassium: 4.7 mmol/L (ref 3.5–5.1)
Potassium: 4.6 mmol/L (ref 3.5–5.1)
Sodium: 138 mmol/L (ref 135–145)
Sodium: 139 mmol/L (ref 135–145)
TCO2: 24 mmol/L (ref 22–32)
TCO2: 24 mmol/L (ref 22–32)
pCO2, Ven: 43 mmHg — ABNORMAL LOW (ref 44.0–60.0)
pCO2, Ven: 42.1 mmHg — ABNORMAL LOW (ref 44.0–60.0)
pH, Ven: 7.327 (ref 7.250–7.430)
pH, Ven: 7.332 (ref 7.250–7.430)
pO2, Ven: 35 mmHg (ref 32.0–45.0)
pO2, Ven: 35 mmHg (ref 32.0–45.0)

## 2020-07-21 LAB — CBC
HCT: 43.9 % (ref 39.0–52.0)
Hemoglobin: 14.2 g/dL (ref 13.0–17.0)
MCH: 31.8 pg (ref 26.0–34.0)
MCHC: 32.3 g/dL (ref 30.0–36.0)
MCV: 98.2 fL (ref 80.0–100.0)
Platelets: 350 10*3/uL (ref 150–400)
RBC: 4.47 MIL/uL (ref 4.22–5.81)
RDW: 14.1 % (ref 11.5–15.5)
WBC: 13.5 10*3/uL — ABNORMAL HIGH (ref 4.0–10.5)
nRBC: 0 % (ref 0.0–0.2)

## 2020-07-21 LAB — BASIC METABOLIC PANEL
Anion gap: 11 (ref 5–15)
BUN: 33 mg/dL — ABNORMAL HIGH (ref 8–23)
CO2: 20 mmol/L — ABNORMAL LOW (ref 22–32)
Calcium: 9.1 mg/dL (ref 8.9–10.3)
Chloride: 105 mmol/L (ref 98–111)
Creatinine, Ser: 2.01 mg/dL — ABNORMAL HIGH (ref 0.61–1.24)
GFR, Estimated: 32 mL/min — ABNORMAL LOW (ref >60)
Glucose, Bld: 141 mg/dL — ABNORMAL HIGH (ref 70–99)
Potassium: 4.7 mmol/L (ref 3.5–5.1)
Sodium: 136 mmol/L (ref 135–145)

## 2020-07-21 LAB — GLUCOSE, CAPILLARY: Glucose-Capillary: 135 mg/dL — ABNORMAL HIGH (ref 70–99)

## 2020-07-21 SURGERY — RIGHT/LEFT HEART CATH AND CORONARY ANGIOGRAPHY
Anesthesia: LOCAL

## 2020-07-21 MED ORDER — MIDAZOLAM HCL 2 MG/2ML IJ SOLN
INTRAMUSCULAR | Status: DC | PRN
  Administered 2020-07-21: 1 mg via INTRAVENOUS

## 2020-07-21 MED ORDER — VERAPAMIL HCL 2.5 MG/ML IV SOLN
INTRAVENOUS | Status: DC | PRN
  Administered 2020-07-21: 10 mL via INTRA_ARTERIAL

## 2020-07-21 MED ORDER — FENTANYL CITRATE (PF) 100 MCG/2ML IJ SOLN
INTRAMUSCULAR | Status: DC | PRN
Start: 2020-07-21 — End: 2020-07-21
  Administered 2020-07-21: 25 ug via INTRAVENOUS

## 2020-07-21 MED ORDER — TORSEMIDE 10 MG PO TABS
10.0000 mg | ORAL_TABLET | Freq: Every day | ORAL | 6 refills | Status: DC
Start: 2020-07-21 — End: 2020-08-26

## 2020-07-21 MED ORDER — VERAPAMIL HCL 2.5 MG/ML IV SOLN
INTRAVENOUS | Status: AC
  Filled 2020-07-21: qty 2

## 2020-07-21 MED ORDER — SODIUM CHLORIDE 0.9% FLUSH: 3.0000 mL | INTRAVENOUS | Status: DC | PRN

## 2020-07-21 MED ORDER — SODIUM CHLORIDE 0.9% FLUSH: 3.0000 mL | Freq: Two times a day (BID) | INTRAVENOUS | Status: DC

## 2020-07-21 MED ORDER — FENTANYL CITRATE (PF) 100 MCG/2ML IJ SOLN
INTRAMUSCULAR | Status: AC
  Filled 2020-07-21: qty 2

## 2020-07-21 MED ORDER — LABETALOL HCL 5 MG/ML IV SOLN: 10.0000 mg | INTRAVENOUS | Status: DC | PRN

## 2020-07-21 MED ORDER — SODIUM CHLORIDE 0.9 % IV SOLN: 250.0000 mL | INTRAVENOUS | Status: DC | PRN

## 2020-07-21 MED ORDER — MIDAZOLAM HCL 2 MG/2ML IJ SOLN
INTRAMUSCULAR | Status: AC
  Filled 2020-07-21: qty 2

## 2020-07-21 MED ORDER — HEPARIN (PORCINE) IN NACL 1000-0.9 UT/500ML-% IV SOLN
INTRAVENOUS | Status: DC | PRN
  Administered 2020-07-21 (×2): 500 mL

## 2020-07-21 MED ORDER — IOHEXOL 350 MG/ML SOLN
INTRAVENOUS | Status: DC | PRN
  Administered 2020-07-21: 40 mL

## 2020-07-21 MED ORDER — HEPARIN SODIUM (PORCINE) 1000 UNIT/ML IJ SOLN
INTRAMUSCULAR | Status: DC | PRN
  Administered 2020-07-21: 5000 [IU] via INTRAVENOUS

## 2020-07-21 MED ORDER — ACETAMINOPHEN 325 MG PO TABS: 650.0000 mg | ORAL_TABLET | ORAL | Status: DC | PRN

## 2020-07-21 MED ORDER — ASPIRIN 81 MG PO CHEW
81.0000 mg | CHEWABLE_TABLET | ORAL | Status: AC
  Administered 2020-07-21: 81 mg via ORAL

## 2020-07-21 MED ORDER — APIXABAN 5 MG PO TABS
5.0000 mg | ORAL_TABLET | Freq: Two times a day (BID) | ORAL | 11 refills | Status: DC
Start: 2020-07-21 — End: 2020-08-26

## 2020-07-21 MED ORDER — ONDANSETRON HCL 4 MG/2ML IJ SOLN: 4.0000 mg | Freq: Four times a day (QID) | INTRAMUSCULAR | Status: DC | PRN

## 2020-07-21 MED ORDER — ASPIRIN 81 MG PO CHEW
CHEWABLE_TABLET | ORAL | Status: AC
  Filled 2020-07-21: qty 1

## 2020-07-21 MED ORDER — HYDRALAZINE HCL 20 MG/ML IJ SOLN: 10.0000 mg | INTRAMUSCULAR | Status: DC | PRN

## 2020-07-21 MED ORDER — LIDOCAINE HCL (PF) 1 % IJ SOLN
INTRAMUSCULAR | Status: DC | PRN
  Administered 2020-07-21: 4 mL

## 2020-07-21 MED ORDER — HEPARIN SODIUM (PORCINE) 1000 UNIT/ML IJ SOLN
INTRAMUSCULAR | Status: AC
  Filled 2020-07-21: qty 1

## 2020-07-21 MED ORDER — HEPARIN (PORCINE) IN NACL 1000-0.9 UT/500ML-% IV SOLN
INTRAVENOUS | Status: AC
  Filled 2020-07-21: qty 1000

## 2020-07-21 MED ORDER — LIDOCAINE HCL (PF) 1 % IJ SOLN
INTRAMUSCULAR | Status: AC
  Filled 2020-07-21: qty 30

## 2020-07-21 MED ORDER — SODIUM CHLORIDE 0.9 % IV SOLN: INTRAVENOUS | Status: DC

## 2020-07-21 SURGICAL SUPPLY — 12 items
CATH 5FR JL3.5 JR4 ANG PIG MP (CATHETERS) ×1 IMPLANT
CATH SWAN GANZ 7F STRAIGHT (CATHETERS) ×1 IMPLANT
DEVICE RAD COMP TR BAND LRG (VASCULAR PRODUCTS) ×1 IMPLANT
GLIDESHEATH SLEND SS 6F .021 (SHEATH) ×1 IMPLANT
GLIDESHEATH SLENDER 7FR .021G (SHEATH) ×1 IMPLANT
GUIDEWIRE .025 260CM (WIRE) ×1 IMPLANT
GUIDEWIRE INQWIRE 1.5J.035X260 (WIRE) IMPLANT
INQWIRE 1.5J .035X260CM (WIRE) ×2
KIT HEART LEFT (KITS) ×2 IMPLANT
PACK CARDIAC CATHETERIZATION (CUSTOM PROCEDURE TRAY) ×2 IMPLANT
TRANSDUCER W/STOPCOCK (MISCELLANEOUS) ×2 IMPLANT
WIRE HI TORQ VERSACORE-J 145CM (WIRE) ×1 IMPLANT

## 2020-07-21 NOTE — Interval H&P Note (Signed)
History and Physical Interval Note:  07/21/2020 9:17 AM  Melvin Ramirez  has presented today for surgery, with the diagnosis of heart failure.  The various methods of treatment have been discussed with the patient and family. After consideration of risks, benefits and other options for treatment, the patient has consented to  Procedure(s): RIGHT/LEFT HEART CATH AND CORONARY ANGIOGRAPHY (N/A) as a surgical intervention.  The patient's history has been reviewed, patient examined, no change in status, stable for surgery.  I have reviewed the patient's chart and labs.  Questions were answered to the patient's satisfaction.     Shizuko Wojdyla [employer]

## 2020-07-21 NOTE — Discharge Instructions (Signed)
1. Take torsemide 20 mg daily x 2 days then take 10 mg daily after that.  2. Restart Eliquis at 8 pm this evening.     Drink plenty of fluid for 48 hours and keep wrist elevated at heart level for 24 hours  Radial Site Care   This sheet gives you information about how to care for yourself after your procedure. Your health care provider may also give you more specific instructions. If you have problems or questions, contact your health care provider. What can I expect after the procedure? After the procedure, it is common to have:  Bruising and tenderness at the catheter insertion area. Follow these instructions at home: Medicines  Take over-the-counter and prescription medicines only as told by your health care provider. Insertion site care 1. Follow instructions from your health care provider about how to take care of your insertion site. Make sure you: ? Wash your hands with soap and water before you change your bandage (dressing). If soap and water are not available, use hand sanitizer. ? remove your dressing as told by your health care provider. In 24 hours 2. Check your insertion site every day for signs of infection. Check for: ? Redness, swelling, or pain. ? Fluid or blood. ? Pus or a bad smell. ? Warmth. 3. Do not take baths, swim, or use a hot tub until your health care provider approves. 4. You may shower 24-48 hours after the procedure, or as directed by your health care provider. ? Remove the dressing and gently wash the site with plain soap and water. ? Pat the area dry with a clean towel. ? Do not rub the site. That could cause bleeding. 5. Do not apply powder or lotion to the site. Activity   1. For 24 hours after the procedure, or as directed by your health care provider: ? Do not flex or bend the affected arm. ? Do not push or pull heavy objects with the affected arm. ? Do not drive yourself home from the hospital or clinic. You may drive 24 hours after the  procedure unless your health care provider tells you not to. ? Do not operate machinery or power tools. 2. Do not lift anything that is heavier than 10 lb (4.5 kg), or the limit that you are told, until your health care provider says that it is safe. For 4 days 3. Ask your health care provider when it is okay to: ? Return to work or school. ? Resume usual physical activities or sports. ? Resume sexual activity. General instructions  If the catheter site starts to bleed, raise your arm and put firm pressure on the site. If the bleeding does not stop, get help right away. This is a medical emergency.  If you went home on the same day as your procedure, a responsible adult should be with you for the first 24 hours after you arrive home.  Keep all follow-up visits as told by your health care provider. This is important. Contact a health care provider if:  You have a fever.  You have redness, swelling, or yellow drainage around your insertion site. Get help right away if:  You have unusual pain at the radial site.  The catheter insertion area swells very fast.  The insertion area is bleeding, and the bleeding does not stop when you hold steady pressure on the area.  Your arm or hand becomes pale, cool, tingly, or numb. These symptoms may represent a serious problem that is  an emergency. Do not wait to see if the symptoms will go away. Get medical help right away. Call your local emergency services (911 in the [place] Do not drive yourself to the hospital. Summary  After the procedure, it is common to have bruising and tenderness at the site.  Follow instructions from your health care provider about how to take care of your radial site wound. Check the wound every day for signs of infection.  Do not lift anything that is heavier than 10 lb (4.5 kg), or the limit that you are told, until your health care provider says that it is safe. This information is not intended to replace advice  given to you by your health care provider. Make sure you discuss any questions you have with your health care provider. Document Revised: 10/30/2017 Document Reviewed: 10/30/2017 Elsevier Patient Education  2020 [employer].

## 2020-07-21 NOTE — Progress Notes (Signed)
Patient and wife was given discharge instructions. Both verbalized understanding. 

## 2020-07-22 ENCOUNTER — Encounter (HOSPITAL_COMMUNITY): Payer: Self-pay | Admitting: Cardiology

## 2020-07-22 ENCOUNTER — Ambulatory Visit: Payer: Commercial Managed Care - PPO | Admitting: Internal Medicine

## 2020-07-24 ENCOUNTER — Other Ambulatory Visit (HOSPITAL_COMMUNITY): Payer: Self-pay | Admitting: Cardiology

## 2020-07-27 ENCOUNTER — Telehealth (HOSPITAL_COMMUNITY): Payer: Self-pay | Admitting: *Deleted

## 2020-07-27 IMAGING — CT CT CHEST HIGH RESOLUTION W/O CM
2 of 6 series · 13 of 36 positions shown, 16 images · non-contrast
Comparison: 10/26/2019 chest CT angiogram.

CLINICAL DATA: Inpatient. Respiratory failure. [id number] pneumonia
in early October 2019. Former smoker. History of nephrectomy for
renal cancer.

EXAM:
CT CHEST WITHOUT CONTRAST
TECHNIQUE: Multidetector CT imaging of the chest was performed following the
standard protocol without intravenous contrast. High resolution
imaging of the lungs, as well as inspiratory and expiratory imaging,
was performed.

[Series 8: chest w/o 2mm st cor · coronal · non-contrast · 0.66mm/px · 3 of 173 slices shown]
[im 35/173  lung]
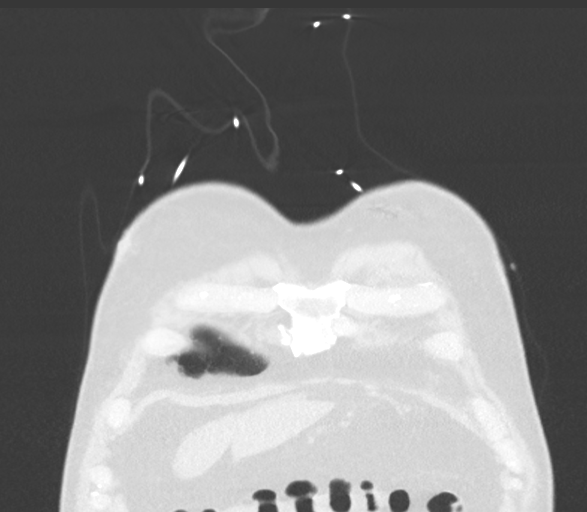
[im 69/173  lung]
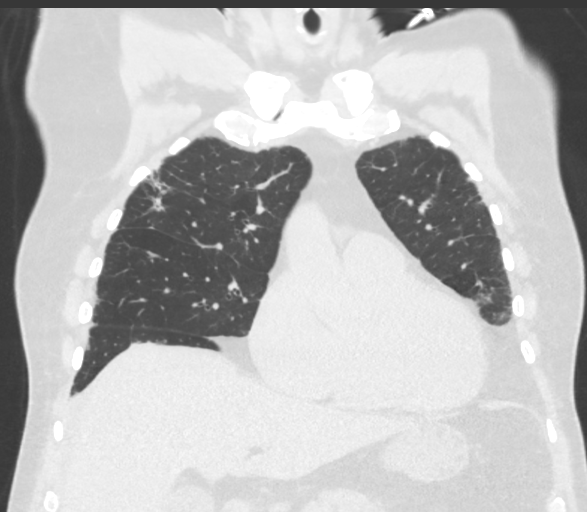
[im 104/173  lung]
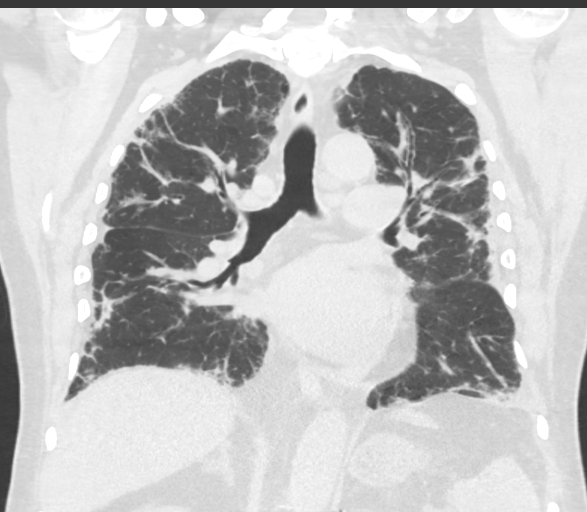

[Series 10: chest w/o sft thins · axial · non-contrast · 0.68mm/px · z∈[+1198,+1497]mm · 10 of 513 slices shown, 13 images]
[im 27/513  mediastinal]
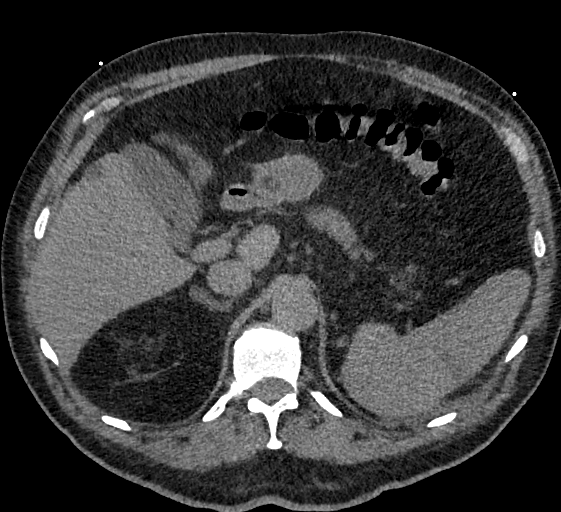
[im 27/513  lung]
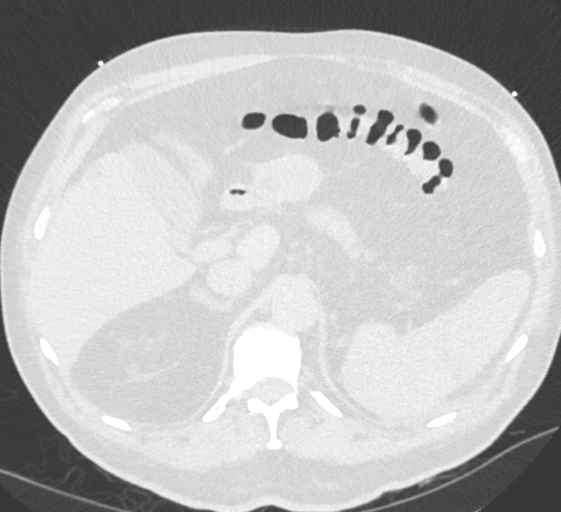
[im 81/513  lung]
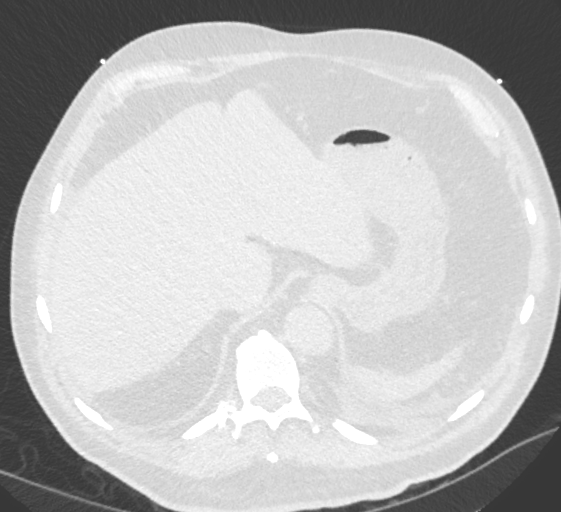
[im 135/513  lung]
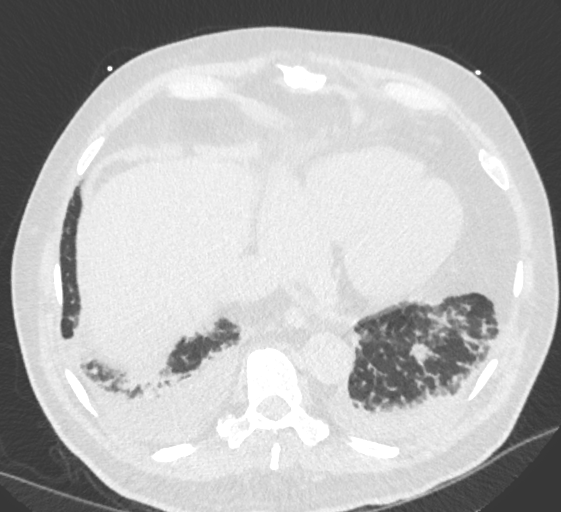
[im 189/513  lung]
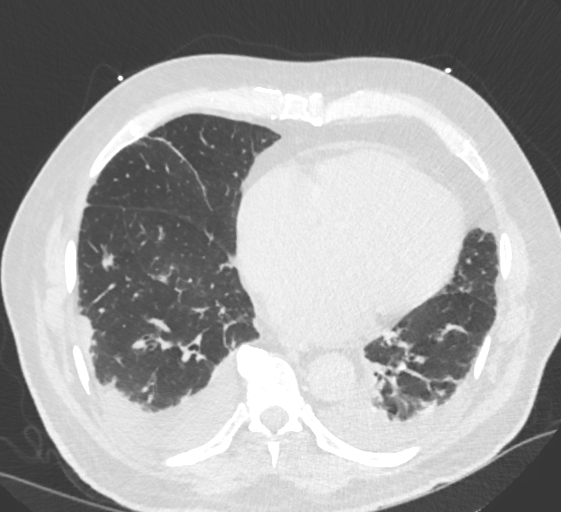
[im 243/513  mediastinal]
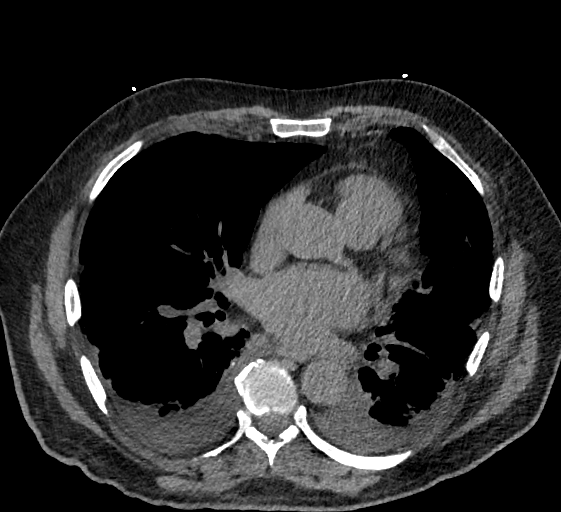
[im 243/513  lung]
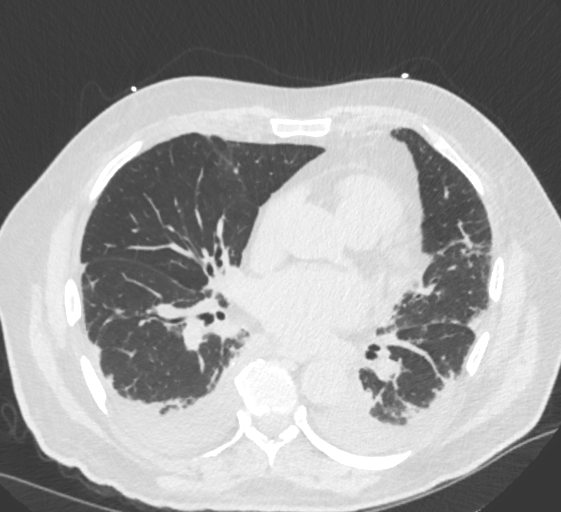
[im 270/513  lung]
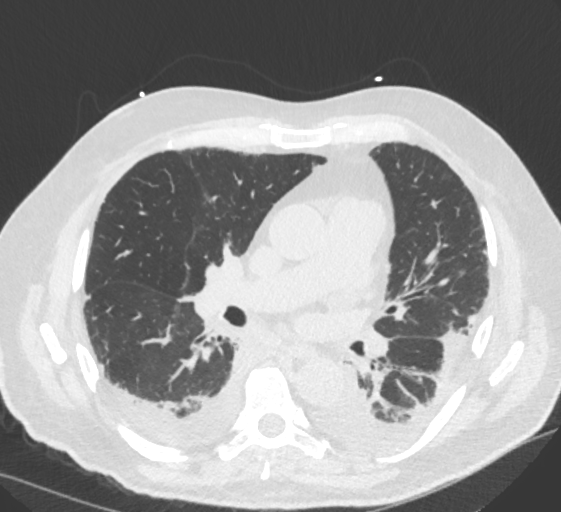
[im 324/513  lung]
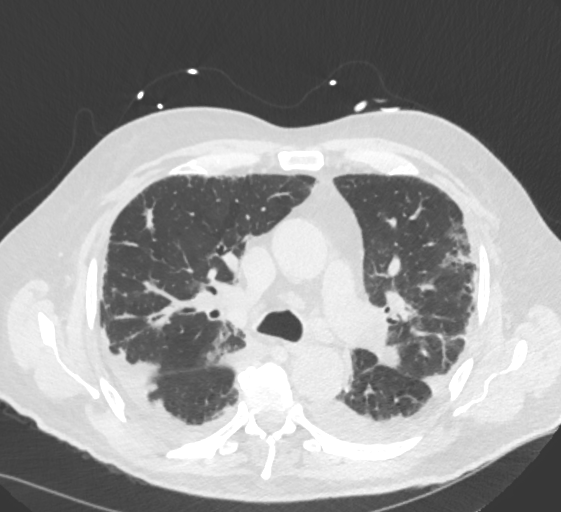
[im 378/513  lung]
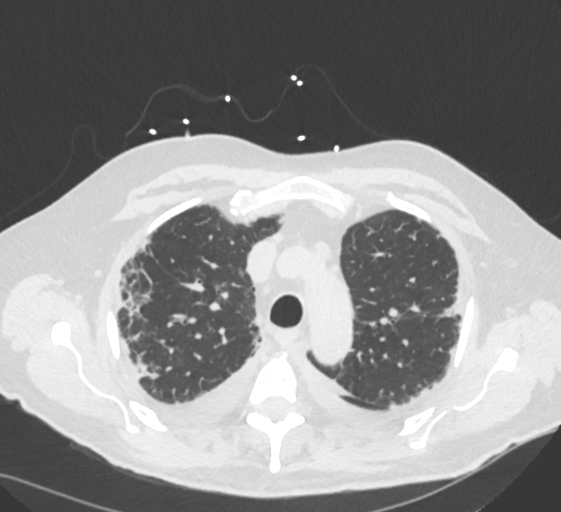
[im 432/513  mediastinal]
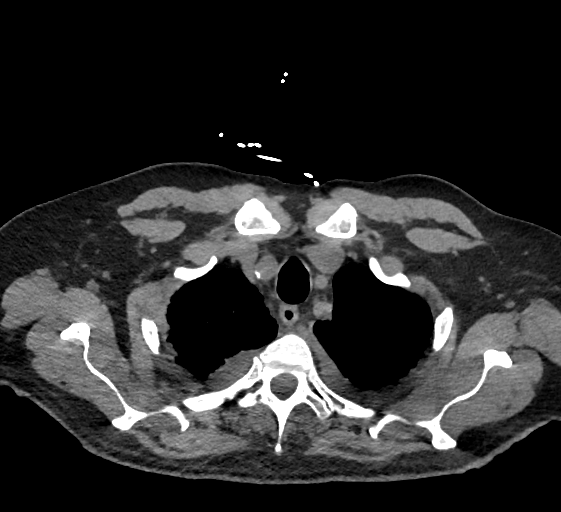
[im 432/513  lung]
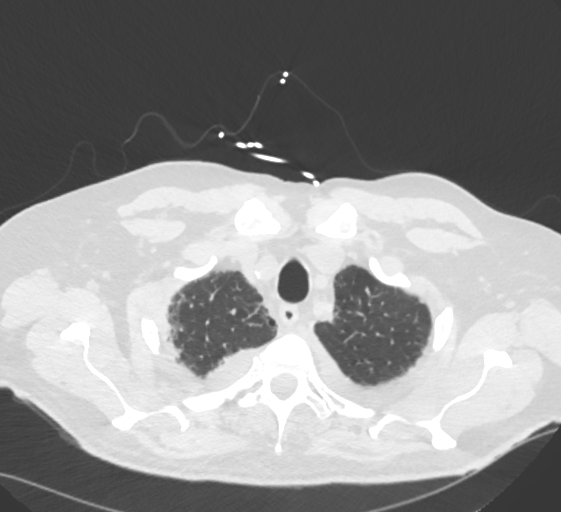
[im 486/513  lung]
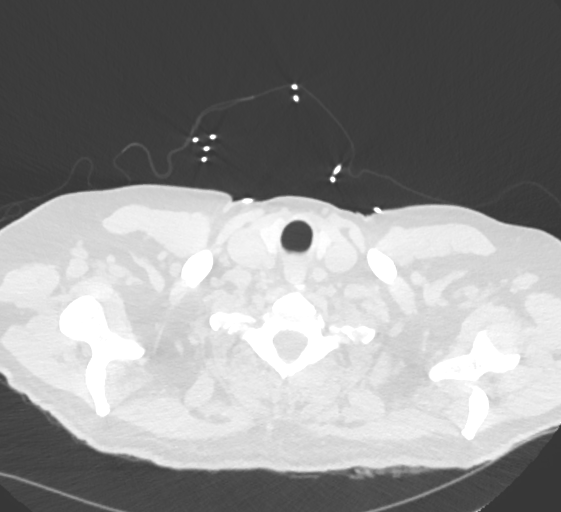

[13 of 36 positions shown; findings below may reference images not displayed]

FINDINGS: Cardiovascular: Top-normal heart size. No significant pericardial
effusion/thickening. Left anterior descending coronary
atherosclerosis. Mildly atherosclerotic nonaneurysmal thoracic
aorta. Dilated main pulmonary artery (3.5 cm diameter).

Mediastinum/Nodes: No discrete thyroid nodules. Unremarkable
esophagus. No pathologically enlarged axillary, mediastinal or hilar
lymph nodes, noting limited sensitivity for the detection of hilar
adenopathy on this noncontrast study. Nonenlarged coarsely calcified
granulomatous right hilar nodes are unchanged.

Lungs/Pleura: No pneumothorax. Small dependent bilateral pleural
effusions with mild passive atelectasis in the dependent lungs.
There is patchy peripheral septal thickening, perilobular
consolidation and ground-glass opacity throughout both lungs
involving all lung lobes. The ground-glass component is decreased in
the interval since 10/26/2019. no significant regions traction
bronchiectasis or frank honeycombing. Mild paraseptal emphysema.
Mild diffuse bronchial wall thickening. No central airway stenoses.
No lung masses or significant pulmonary nodules. These findings
persist on the prone sequence.

Upper abdomen: Cholelithiasis. New small wedge-shaped
hypoattenuating region in the spleen suggestive of a small splenic
infarct.

Musculoskeletal: No aggressive appearing focal osseous lesions.
Moderate thoracic spondylosis.
IMPRESSION: 1. Patchy peripheral septal thickening, perilobular consolidation
and ground-glass opacity throughout both lungs, with decreased
ground-glass component since 10/26/2019 chest CT. Favor slowly
resolving [id number] pneumonia, with early postinflammatory fibrosis
not excluded. Consider follow-up high-resolution chest CT study in 6
months, as clinically warranted.
2. Small dependent bilateral pleural effusions with mild passive
atelectasis in the dependent lungs.
3. Dilated main pulmonary artery, suggesting pulmonary arterial
hypertension.
4. One vessel coronary atherosclerosis.
5. Cholelithiasis.
6. New small wedge-shaped hypoattenuating region in the spleen
suggestive of a small splenic infarct.
7. Aortic Atherosclerosis (UGYB3-6C7.7) and Emphysema ([id number].T).

## 2020-07-27 IMAGING — US US RENAL
1 series · 14 of 25 positions shown · non-contrast
Comparison: 09/28/2016

CLINICAL DATA: Left nephrectomy for renal cell carcinoma, acute
renal insufficiency

EXAM:
RENAL / URINARY TRACT ULTRASOUND COMPLETE

[Series 1: us renal · 14 of 37 slices shown]
[im 1/37]
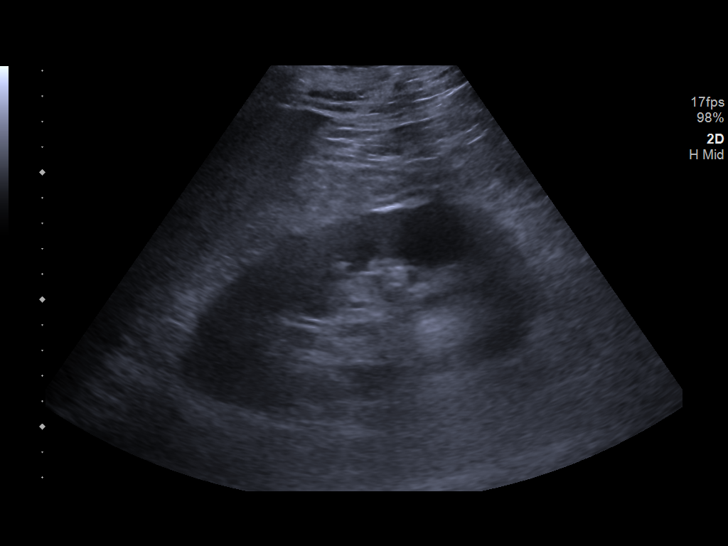
[im 4/37]
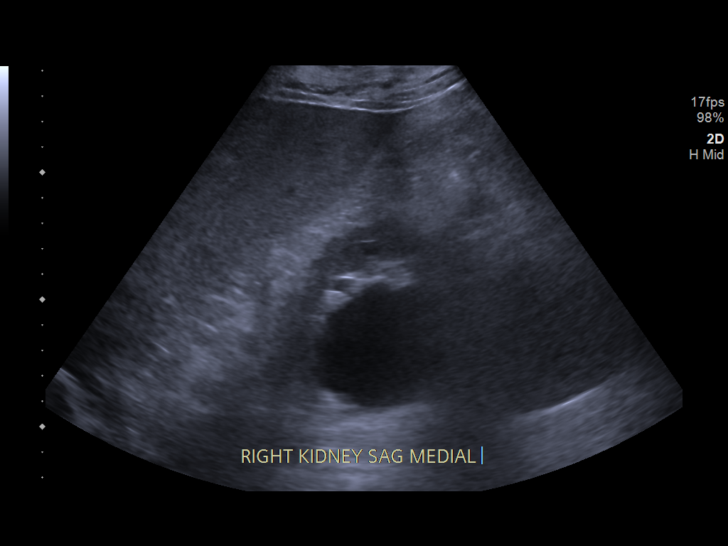
[im 7/37]
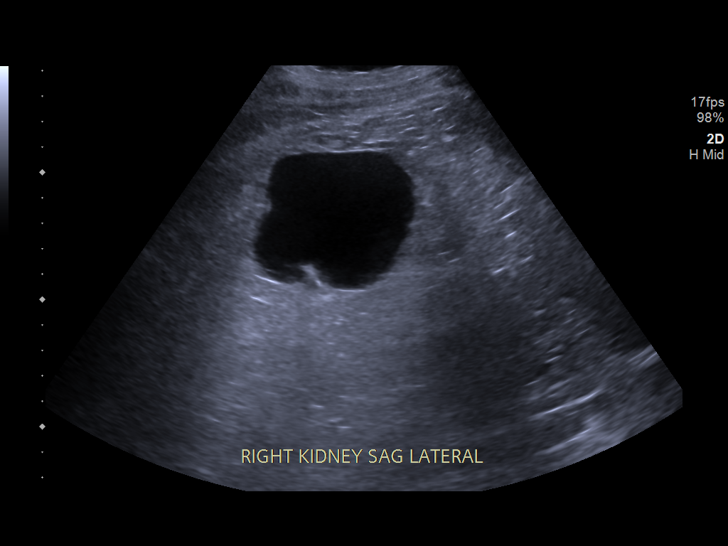
[im 10/37]
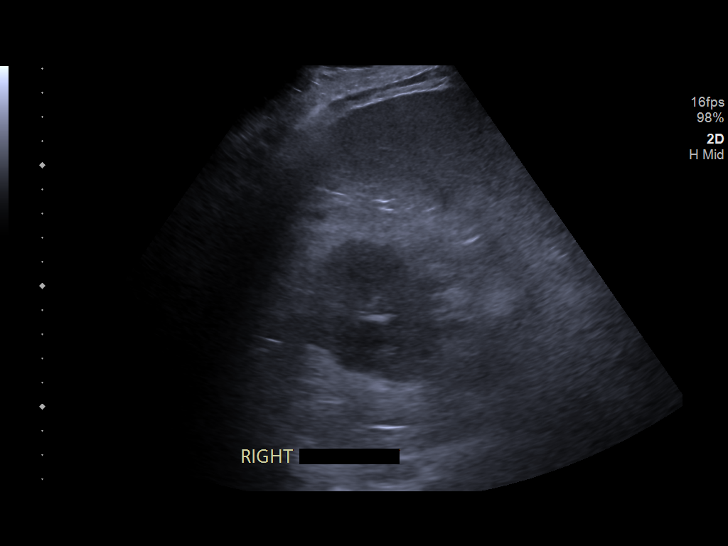
[im 13/37]
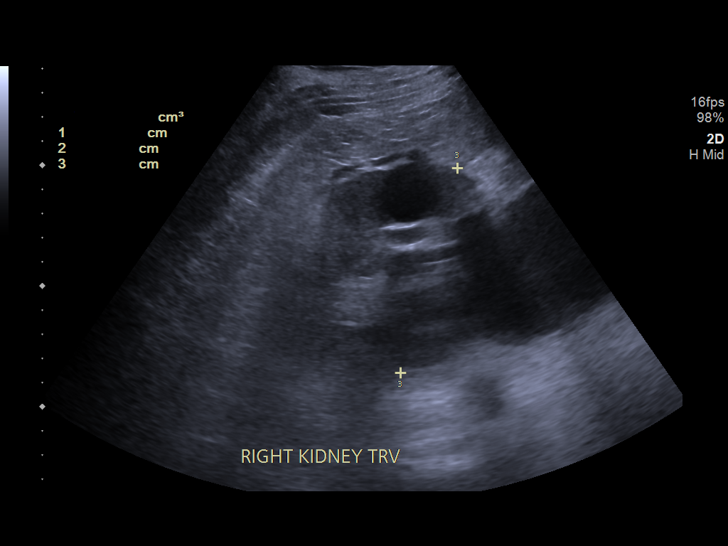
[im 14/37]
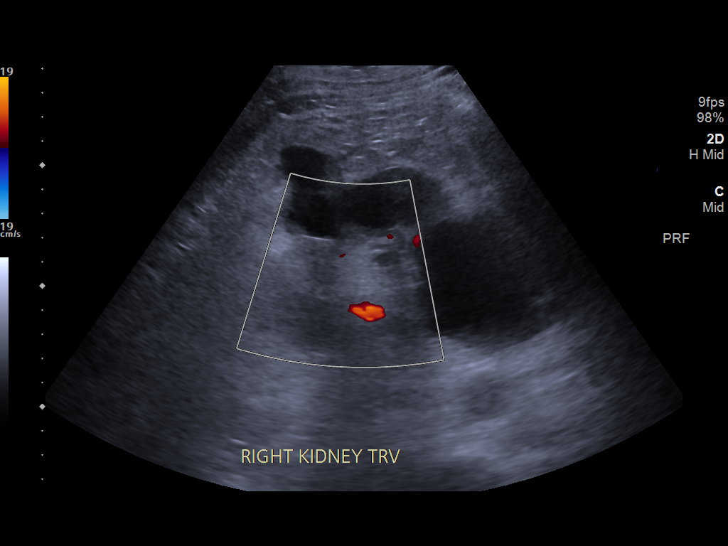
[im 17/37]
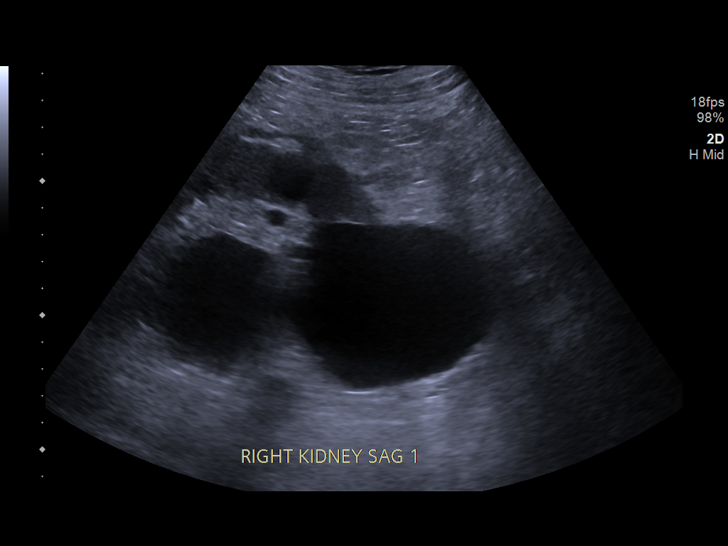
[im 20/37]
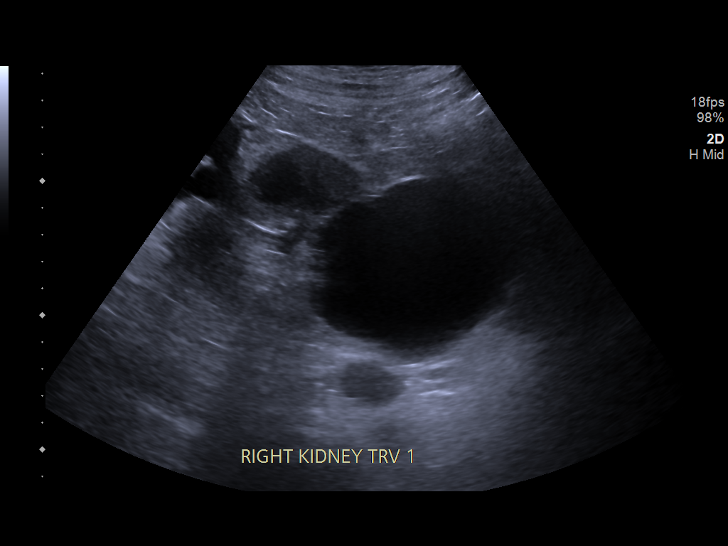
[im 23/37]
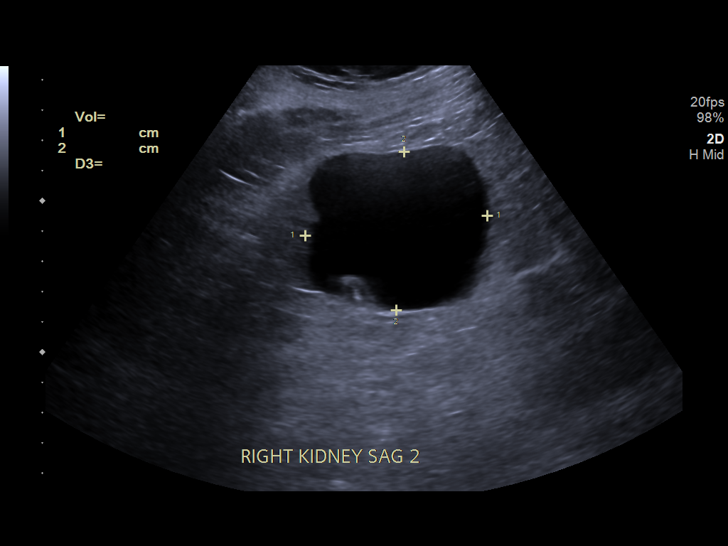
[im 25/37]
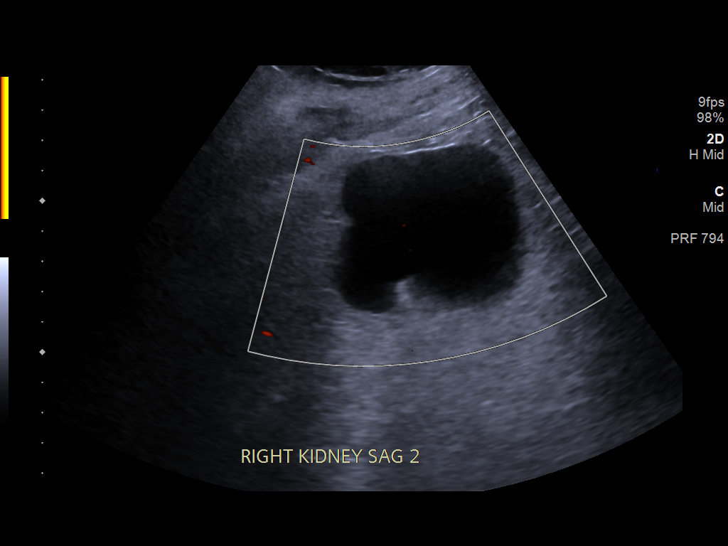
[im 28/37]
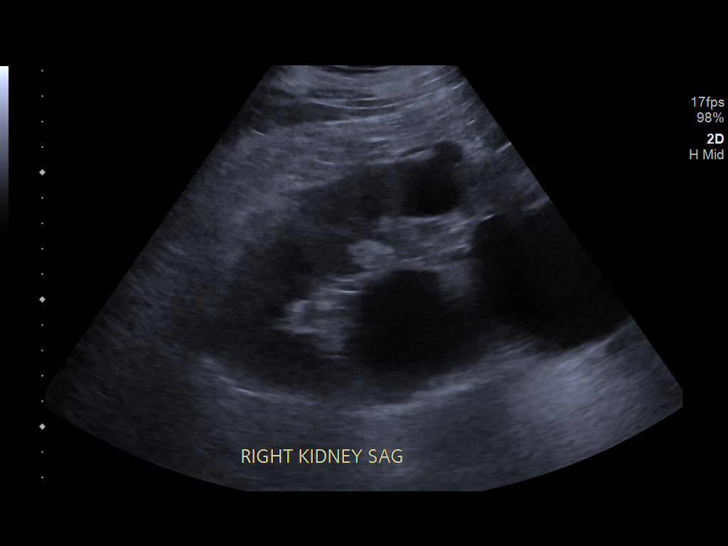
[im 31/37]
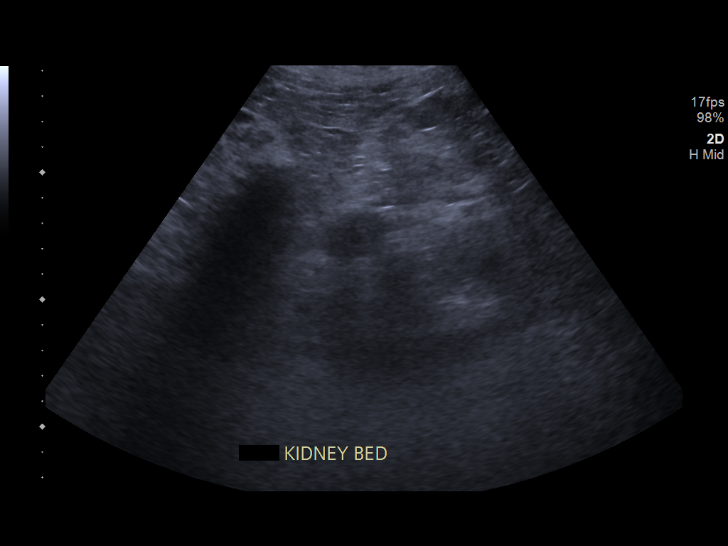
[im 34/37]
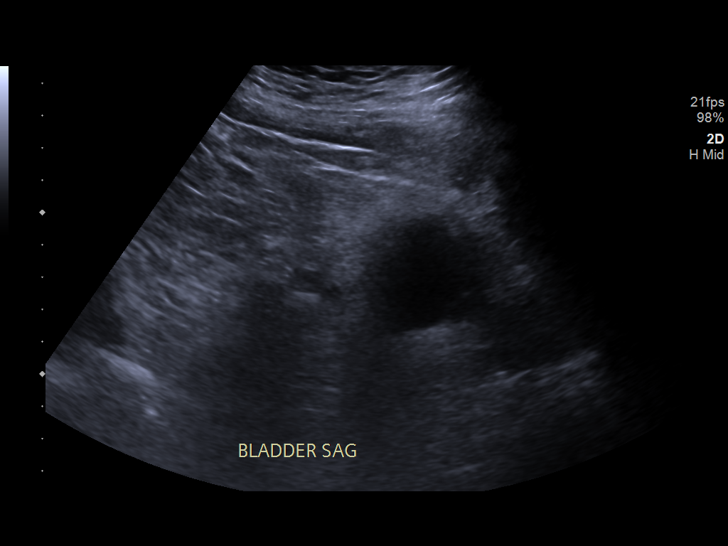
[im 37/37]
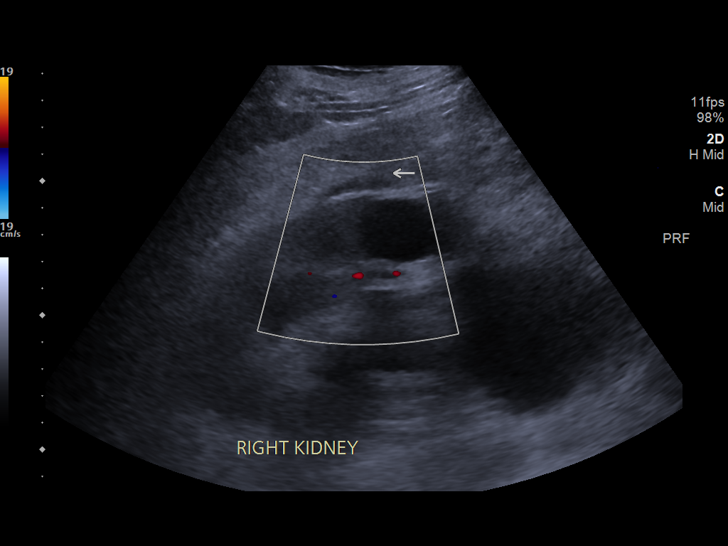

[14 of 25 positions shown; findings below may reference images not displayed]

FINDINGS: Right Kidney:

Renal measurements: 14.7 x 7.5 by 8.8 cm = volume: 509 mL. There is
a 7.9 x 6.4 x 7.8 cm simple appearing cyst within the lower pole
right kidney. In the lateral interpolar region there is a minimally
complex 6.1 x 5.3 x 5.0 cm cyst, with a single septation identified.
No solid renal lesions. No hydronephrosis. Echotexture is normal.

Left Kidney:

Surgically absent.  Unremarkable nephrectomy bed.

Bladder:

Minimally distended without gross abnormality. Evaluation severely
limited.

Other:

None.
IMPRESSION: 1. Right renal cysts as above, increased in size since prior study.
If further evaluation is desired, dedicated renal CT or MRI could be
considered.
2. Left nephrectomy for history of renal cell carcinoma.

## 2020-07-27 NOTE — Telephone Encounter (Signed)
-----   Message from Larey Dresser, MD sent at 07/21/2020 10:10 AM EDT ----- 1. Please arrange appointment to followup with [place] in 10-14 days.  2. Needs BMET in 1 week (think he is getting it done at [facility], need to get copy).  3. Needs torsemide 20 mg x 2 days then 10 mg daily after that.  May need prescription.

## 2020-07-27 NOTE — Telephone Encounter (Signed)
Spoke w/pt's wife, pt is sch to have labs done tomorrow at [facility], she will make sure they send [place] results. Pt is sch to see Dr Guy Sandifer next week, she states he is not able to come in to see Dr Aundra Dubin sooner than 11/15 appt due to his work schedule. She states he is doing well he took Torsemide over the weekend but is not able to take it during the week due to his job but he will take it on the weekends, she states he weighs daily and they monitor for s/s of fluid retention. Advised to monitor closely and if pt feeling bad before appt to call us back for a sooner appt.

## 2020-07-28 ENCOUNTER — Encounter: Payer: Commercial Managed Care - PPO | Admitting: Thoracic Surgery (Cardiothoracic Vascular Surgery)

## 2020-08-04 ENCOUNTER — Encounter: Payer: Commercial Managed Care - PPO | Admitting: Thoracic Surgery (Cardiothoracic Vascular Surgery)

## 2020-08-04 ENCOUNTER — Other Ambulatory Visit (HOSPITAL_COMMUNITY): Payer: Self-pay | Admitting: Cardiology

## 2020-08-08 ENCOUNTER — Encounter: Payer: Commercial Managed Care - PPO | Admitting: Thoracic Surgery (Cardiothoracic Vascular Surgery)

## 2020-08-10 ENCOUNTER — Other Ambulatory Visit (HOSPITAL_COMMUNITY): Payer: Self-pay | Admitting: Cardiology

## 2020-08-11 ENCOUNTER — Encounter: Payer: Commercial Managed Care - PPO | Admitting: Thoracic Surgery (Cardiothoracic Vascular Surgery)

## 2020-08-11 ENCOUNTER — Other Ambulatory Visit (HOSPITAL_COMMUNITY): Payer: Self-pay | Admitting: Cardiology

## 2020-08-22 ENCOUNTER — Encounter: Payer: Self-pay | Admitting: Thoracic Surgery (Cardiothoracic Vascular Surgery)

## 2020-08-22 ENCOUNTER — Encounter (HOSPITAL_COMMUNITY): Payer: Commercial Managed Care - PPO | Admitting: Cardiology

## 2020-08-22 ENCOUNTER — Institutional Professional Consult (permissible substitution) (INDEPENDENT_AMBULATORY_CARE_PROVIDER_SITE_OTHER): Payer: Commercial Managed Care - PPO | Admitting: Thoracic Surgery (Cardiothoracic Vascular Surgery)

## 2020-08-22 ENCOUNTER — Other Ambulatory Visit: Payer: Self-pay

## 2020-08-22 VITALS — BP 107/57 | HR 72 | Temp 98.1°F | Resp 20 | Ht 73.0 in | Wt 226.0 lb

## 2020-08-22 DIAGNOSIS — I5042 Chronic combined systolic (congestive) and diastolic (congestive) heart failure: Secondary | ICD-10-CM

## 2020-08-22 DIAGNOSIS — I4819 Other persistent atrial fibrillation: Secondary | ICD-10-CM | POA: Diagnosis not present

## 2020-08-22 NOTE — Progress Notes (Addendum)
[address]       [place] [address]             [place] REPORT  Referring Provider is [name], MD Advanced Heart Failure Cardiologist is Larey Dresser, MD Pulmonologist is Collene Gobble, MD PCP is [name], MD  Chief Complaint  Patient presents with  . Atrial Fibrillation    Surgical Consult, TEE 05/18/20    HPI:  Patient is a 74 year old male with history of renal cell carcinoma status post nephrectomy, stage III chronic kidney disease, type 2 diabetes mellitus, recent COVID-19 pneumonia January 2021 with presumably secondary interstitial lung disease, and chronic combined systolic and diastolic congestive heart failure, who has now been referred for surgical consultation to discuss treatment options for management of recurrent persistent atrial fibrillation that has failed attempts at chemical cardioversion using Tikosyn and amiodarone and has failed DC cardioversion on multiple occasions.  Patient states that he has been relatively healthy and reasonably active physically most of his adult life.  He underwent nephrectomy in the distant past for renal cell carcinoma.  He states that he was in his usual state of health until January of this year when he became acutely ill with COVID-19 pneumonia.  During his convalescence he developed rapid atrial fibrillation with acute diastolic congestive heart failure requiring hospitalization in February.  He was started on anticoagulation but subsequently developed GI bleed requiring EGD for clipping of a visible vessel in the gastric ulcer.  He was eventually started on Eliquis.  Echocardiogram performed during that hospitalization revealed severe LV systolic dysfunction with ejection fraction estimated 25 to 30%.  He was initially seen in follow-up by Dr. Oval Linsey but more recently has been followed by Dr. Rayann Heman in the atrial fibrillation clinic and Dr. Aundra Dubin in  the advanced heart failure clinic.  He has developed recurrent persistent atrial fibrillation that has failed multiple attempts at DC cardioversion as well as both Tikosyn and more recently amiodarone therapy.  He was last seen in follow-up in the pulmonary medicine clinic by Dr. Lamonte Sakai on Feb 25, 2020 at which time follow-up CT scan revealed persistent groundglass opacity in both lung fields suspicious for COVID-19 related interstitial lung disease.  In July he was brought in for an attempt at catheter-based ablation for atrial fibrillation.  Preprocedure cardiac gated CTA of the heart revealed findings suggestive of clot in the left atrial appendage.  This was confirmed by transesophageal echocardiogram and but the procedure was canceled.  Follow-up transesophageal echocardiogram performed 1 month later confirmed resolution of the clot.  At that time there remained moderate to severe left ventricular dysfunction with ejection fraction estimated 35 to 40%.  There was also felt to be mildly reduced right ventricular function with mild right ventricular chamber enlargement.  The mitral valve appeared normal with no mitral regurgitation.  There was moderate left atrial enlargement.  The patient was seen in follow-up by Dr. Rayann Heman on July 11, 2020.  At that time the patient remained symptomatic with complaints of fatigue, chest discomfort and decreased exercise tolerance.  The possibility of referral for surgical [place] procedure was discussed and diagnostic cardiac catheterization arranged.  Left and right heart catheterization was performed by Dr. Aundra Dubin July 21, 2020 and revealed mild nonobstructive coronary disease with elevated right heart filling pressures that were notably higher than left heart filling pressures.  There was significant pulmonary venous hypertension and reduced cardiac output.  The patient was subsequently referred for surgical consultation.  Patient is married and lives locally in  [place] with his wife.  He works full-time as a Control and instrumentation engineer.  He usually does not perform significant amount of heavy lifting or strenuous exertion.  He complains that he has never recovered completely from his COVID-19 pneumonia.  He states that his breathing has never recovered to baseline.  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  His primary complaint is his energy level is low.  He does have some exertional shortness of breath.  He denies resting shortness of breath, PND, or orthopnea.  He takes torsemide on the weekends to prevent lower extremity edema but does not use it during the week when he is driving his truck.  He states that for the most part he does not have any swelling in his legs but occasionally they will swell up at the end of a long day.  He denies any productive cough.  He denies any pleuritic chest pain.  He has never had any hemoptysis.   Past Medical History:  Diagnosis Date  . Chronic combined systolic and diastolic heart failure ([place]) 12/13/2019  . Chronic kidney disease   . CKD (chronic kidney disease) stage 3, GFR 30-59 ml/min (HCC) 12/13/2019  . Diabetes mellitus ([place])   . Gastric ulcer 12/13/2019   +H. Pylori. Bleeding required clipping.  . H/O blood clots   . History of kidney cancer   . Persistent atrial fibrillation ([place]) 12/13/2019  . Renal cell carcinoma ([place]) 12/13/2019   S/p nephrectomy    Past Surgical History:  Procedure Laterality Date  . APPENDECTOMY    . ATRIAL FIBRILLATION ABLATION N/A 04/07/2020   Procedure: ATRIAL FIBRILLATION ABLATION;  Surgeon: [name], MD;  Location: [facility];  Service: Cardiovascular;  Laterality: N/A;  . BIOPSY  11/19/2019   Procedure: BIOPSY;  Surgeon: Ronald Lobo, MD;  Location: [facility];  Service: Endoscopy;;  . CARDIOVERSION N/A 12/17/2019   Procedure: CARDIOVERSION;  Surgeon: Geralynn Rile, MD;  Location: [facility];  Service:  Cardiovascular;  Laterality: N/A;  . CARDIOVERSION N/A 02/18/2020   Procedure: CARDIOVERSION;  Surgeon: Jerline Pain, MD;  Location: [facility] ENDOSCOPY;  Service: Cardiovascular;  Laterality: N/A;  . CARDIOVERSION N/A 02/29/2020   Procedure: CARDIOVERSION;  Surgeon: Larey Dresser, MD;  Location: [facility] ENDOSCOPY;  Service: Cardiovascular;  Laterality: N/A;  . CARDIOVERSION N/A 06/06/2020   Procedure: CARDIOVERSION;  Surgeon: Larey Dresser, MD;  Location: [facility] ENDOSCOPY;  Service: Cardiovascular;  Laterality: N/A;  . ESOPHAGOGASTRODUODENOSCOPY N/A 11/19/2019   Procedure: ESOPHAGOGASTRODUODENOSCOPY (EGD);  Surgeon: Ronald Lobo, MD;  Location: [facility] ENDOSCOPY;  Service: Endoscopy;  Laterality: N/A;  . HEMOSTASIS CLIP PLACEMENT  11/19/2019   Procedure: HEMOSTASIS CLIP PLACEMENT;  Surgeon: Ronald Lobo, MD;  Location: [facility];  Service: Endoscopy;;  . NEPHRECTOMY    . RIGHT/LEFT HEART CATH AND CORONARY ANGIOGRAPHY N/A 07/21/2020   Procedure: RIGHT/LEFT HEART CATH AND CORONARY ANGIOGRAPHY;  Surgeon: Larey Dresser, MD;  Location: [facility];  Service: Cardiovascular;  Laterality: N/A;  . TEE WITHOUT CARDIOVERSION N/A 05/18/2020   Procedure: TRANSESOPHAGEAL ECHOCARDIOGRAM (TEE);  Surgeon: Larey Dresser, MD;  Location: [facility] ENDOSCOPY;  Service: Cardiovascular;  Laterality: N/A;    Family History  Problem Relation Age of Onset  . Cancer Mother   . Heart attack Father   . Heart attack Brother   . Heart disease Sister   .  Stroke Paternal Grandmother     Social History   Socioeconomic History  . Marital status: Married    Spouse name: Not on file  . Number of children: Not on file  . Years of education: Not on file  . Highest education level: Not on file  Occupational History  . Occupation: truck Geophysicist/field seismologist  Tobacco Use  . Smoking status: Former Smoker    Packs/day: 2.00    Years: 35.00    Pack years: 70.00    Types: Cigarettes    Quit date: 2000    Years since quitting: 21.8    . Smokeless tobacco: Never Used  Vaping Use  . Vaping Use: Never used  Substance and Sexual Activity  . Alcohol use: Yes    Alcohol/week: 1.0 - 2.0 standard drink    Types: 1 - 2 Cans of beer per week    Comment: Socially  . Drug use: No  . Sexual activity: Not on file  Other Topics Concern  . Not on file  Social History Narrative   Lives with wife in a one story home.  Has 3 children.     Works as a Administrator.     Education: 12th grade.   Social Determinants of Health   Financial Resource Strain:   . Difficulty of Paying Living Expenses: Not on file  Food Insecurity:   . Worried About Charity fundraiser in the Last Year: Not on file  . Ran Out of Food in the Last Year: Not on file  Transportation Needs:   . Lack of Transportation (Medical): Not on file  . Lack of Transportation (Non-Medical): Not on file  Physical Activity:   . Days of Exercise per Week: Not on file  . Minutes of Exercise per Session: Not on file  Stress:   . Feeling of Stress : Not on file  Social Connections:   . Frequency of Communication with Friends and Family: Not on file  . Frequency of Social Gatherings with Friends and Family: Not on file  . Attends Religious Services: Not on file  . Active Member of Clubs or Organizations: Not on file  . Attends Archivist Meetings: Not on file  . Marital Status: Not on file  Intimate Partner Violence:   . Fear of Current or Ex-Partner: Not on file  . Emotionally Abused: Not on file  . Physically Abused: Not on file  . Sexually Abused: Not on file    Current Outpatient Medications  Medication Sig Dispense Refill  . ACCU-CHEK GUIDE test strip daily. for testing    . Accu-Chek Softclix Lancets lancets     . acetaminophen (TYLENOL) 500 MG tablet Take 1,000 mg by mouth every 6 (six) hours as needed for mild pain or headache.    Marland Kitchen amiodarone (PACERONE) 200 MG tablet TAKE 1 TABLET BY MOUTH TWICE A DAY (Patient taking differently: daily. ) 180  tablet 1  . apixaban (ELIQUIS) 5 MG TABS tablet Take 1 tablet (5 mg total) by mouth 2 (two) times daily. 60 tablet 11  . ascorbic acid (SM VITAMIN C) 1000 MG tablet Take 1,000 mg by mouth every evening.     Marland Kitchen atorvastatin (LIPITOR) 10 MG tablet Take 1 tablet (10 mg total) by mouth daily. 30 tablet 6  . Blood Glucose Monitoring Suppl (ACCU-CHEK GUIDE ME) w/Device KIT See admin instructions.    . carvedilol (COREG) 6.25 MG tablet TAKE 2 TABLETS (12.5 MG TOTAL) BY MOUTH 2 (TWO) TIMES DAILY WITH  A MEAL. (Patient taking differently: Take 12.5 mg by mouth in the morning and at bedtime. ) 360 tablet 1  . dapagliflozin propanediol (FARXIGA) 10 MG TABS tablet Take 1 tablet (10 mg total) by mouth daily before breakfast. 30 tablet 6  . digoxin (LANOXIN) 0.125 MG tablet Take 0.5 tablets (0.0625 mg total) by mouth daily. 90 tablet 3  . ENTRESTO 24-26 MG TAKE 1 TABLET BY MOUTH 2 (TWO) TIMES DAILY. 60 tablet 5  . Garlic 7619 MG CAPS Take 1,000 mg by mouth every evening.    . Glucosamine HCl 1000 MG TABS Take 1,000 mg by mouth every evening.    . Multiple Vitamin (MULTIVITAMIN WITH MINERALS) TABS tablet Take 1 tablet by mouth daily with breakfast.     . Omega-3 Fatty Acids (FISH OIL PO) Take 350 mg by mouth every evening.     . pantoprazole (PROTONIX) 40 MG tablet Take 40 mg by mouth every evening.     . Saw Palmetto, Serenoa repens, (SAW PALMETTO PO) Take 540 mg by mouth every evening.     . torsemide (DEMADEX) 10 MG tablet Take 1 tablet (10 mg total) by mouth daily. 90 tablet 6   No current facility-administered medications for this visit.    Allergies  Allergen Reactions  . Aspirin Other (See Comments)    Bruises easily   . Novocain [Procaine] Other (See Comments)    Syncope (passed out)       Review of Systems:   General:  normal appetite, decreased energy, no weight gain, + 40 lb weight loss during COVID infection but stable since, no fever  Cardiac:  no chest pain with exertion, no chest pain  at rest, +SOB with exertion, no resting SOB, no PND, no orthopnea, no palpitations, + arrhythmia, + atrial fibrillation, some LE edema, no dizzy spells, no syncope  Respiratory:  + exertional shortness of breath, no home oxygen, no productive cough, no dry cough, no bronchitis, no wheezing, no hemoptysis, no asthma, no pain with inspiration or cough, no sleep apnea, no CPAP at night  GI:   no difficulty swallowing, no reflux, no frequent heartburn, no hiatal hernia, no abdominal pain, no constipation, no diarrhea, no hematochezia, no hematemesis, no melena  GU:   no dysuria,  no frequency, no urinary tract infection, no hematuria, no enlarged prostate, no kidney stones, + kidney disease  Vascular:  no pain suggestive of claudication, no pain in feet, no leg cramps, no varicose veins, no DVT, no non-healing foot ulcer  Neuro:   no stroke, no TIA's, no seizures, no headaches, no temporary blindness one eye,  no slurred speech, no peripheral neuropathy, no chronic pain, no instability of gait, + memory/cognitive dysfunction  Musculoskeletal: no arthritis, no joint swelling, no myalgias, no difficulty walking, normal mobility   Skin:   no rash, no itching, no skin infections, no pressure sores or ulcerations  Psych:   no anxiety, no depression, no nervousness, no unusual recent stress  Eyes:   + blurry vision, no floaters, + recent vision changes, does not wear glasses or contacts  ENT:   no hearing loss, no loose or painful teeth, + dentures  Hematologic:  + easy bruising, no abnormal bleeding, ? clotting disorder, no frequent epistaxis  Endocrine:  + diabetes, does not check CBG's at home     Physical Exam:   BP (!) 107/57 (BP Location: Left Arm, Patient Position: Sitting, Cuff Size: Large)   Pulse 72   Temp 98.1 F (36.7 C) (  Skin)   Resp 20   Ht 6' 1"  (1.854 m)   Wt 226 lb (102.5 kg)   SpO2 97% Comment: RA  BMI 29.82 kg/m   General:  well-appearing  HEENT:  Unremarkable   Neck:   no JVD,  no bruits, no adenopathy   Chest:   clear to auscultation, symmetrical breath sounds, no wheezes, no rhonchi   CV:   Irregular rate and rhythm, no murmur   Abdomen:  soft, non-tender, no masses   Extremities:  warm, well-perfused, pulses diminished but palpable, no LE edema  Rectal/GU  Deferred  Neuro:   Grossly non-focal and symmetrical throughout  Skin:   Clean and dry, no rashes, no breakdown   Diagnostic Tests:  EKG: Atrial fibrillation w/ HR 74  (07/11/2020)      ECHOCARDIOGRAM REPORT       Patient Name:  Melvin Ramirez Reza Date of Exam: 11/16/2019  Medical Rec #: [id number]   Height:    73.0 in  Accession #:  [id number]  Weight:    236.8 lb  Date of Birth: 12/10/1945   BSA:     2.31 m  Patient Age:  31 years   BP:      103/68 mmHg  Patient Gender: M       HR:      99 bpm.  Exam Location: Inpatient   Procedure: 2D Echo, Cardiac Doppler and Color Doppler   Indications:   Atrial Fibrillation 427.31    History:     Patient has no prior history of Echocardiogram  examinations.          Arrythmias:Atrial Fibrillation; Risk Factors:Diabetes and          Former Smoker.    Sonographer:   Vickie Epley RDCS  Referring Phys: [place]  Diagnosing Phys: Dixie Dials MD   IMPRESSIONS    1. Left ventricular ejection fraction, by estimation, is 25 to 30%. The  left ventricle has severely decreased function. The left ventrical  demonstrates global hypokinesis. The left ventricular internal cavity size  was mildly dilated. Indeterminate  diastolic filling due to E-A fusion. There is severe hypokinesis of the  left ventricular, entire anterior wall. There is mild hypokinesis of the  left ventricular, entire inferior wall.  2. Right ventricular systolic function is mildly reduced. The right  ventricular size is mildly enlarged. There is moderately elevated  pulmonary artery systolic pressure.  3.  Moderate mitral valve regurgitation.  4. Tricuspid valve regurgitation is moderate.  5. The aortic valve is tricuspid. Aortic valve regurgitation is mild.   Conclusion(s)/Recomendation(s): Findings consistent with dilated  cardiomyopathy.   FINDINGS  Left Ventricle: Left ventricular ejection fraction, by estimation, is 25  to 30%. The left ventricle has severely decreased function. The left  ventricle demonstrates global hypokinesis. Severe hypokinesis of the left  ventricular, entire anterior wall.  Mild hypokinesis of the left ventricular, entire inferior wall. The left  ventricular internal cavity size was mildly dilated. There is no left  ventricular hypertrophy.     LV Wall Scoring:  The mid and distal anterior wall, apical lateral segment, and apex are  akinetic. The antero-lateral wall, entire septum, entire inferior wall,  posterior wall, and basal anterior segment are hypokinetic.   Right Ventricle: The right ventricular size is mildly enlarged. No  increase in right ventricular wall thickness. Right ventricular systolic  function is mildly reduced. There is moderately elevated pulmonary artery  systolic pressure. The tricuspid  regurgitant velocity is 3.00  m/s, and with an assumed right atrial  pressure of 15 mmHg, the estimated right ventricular systolic pressure is  08.6 mmHg.   Left Atrium: Left atrial size was moderately dilated.   Right Atrium: Right atrial size was moderately dilated.   Pericardium: There is no evidence of pericardial effusion.   Mitral Valve: The mitral valve is normal in structure and function.  Moderate mitral valve regurgitation.   Tricuspid Valve: The tricuspid valve is normal in structure. Tricuspid  valve regurgitation is moderate.   Aortic Valve: The aortic valve is tricuspid. . There is mild thickening  and mild calcification of the aortic valve. Aortic valve regurgitation is  mild. Mild aortic valve annular calcification.  There is mild thickening of  the aortic valve. There is mild  calcification of the aortic valve.   Pulmonic Valve: The pulmonic valve was grossly normal. Pulmonic valve  regurgitation is mild.   Aorta: The aortic root and ascending aorta are structurally normal, with  no evidence of dilitation. There is mild (Grade II) atheroma plaque  involving the ascending aorta.   Venous: The inferior vena cava is dilated in size with less than 50%  respiratory variability, suggesting right atrial pressure of 15 mmHg.   IAS/Shunts: No atrial level shunt detected by color flow Doppler.     LEFT VENTRICLE  PLAX 2D  LVIDd:     6.31 cm  LVIDs:     5.33 cm  LV PW:     0.89 cm  LV IVS:    0.89 cm  LVOT diam:   2.30 cm  LV SV:     41.86 ml  LV SV Index:  27.24  LVOT Area:   4.15 cm    LV Volumes (MOD)  LV area d, A2C:  51.60 cm  LV area d, A4C:  46.70 cm  LV area s, A2C:  44.70 cm  LV area s, A4C:  42.00 cm  LV major d, A2C:  9.26 cm  LV major d, A4C:  9.29 cm  LV major s, A2C:  8.93 cm  LV major s, A4C:  9.32 cm  LV vol d, MOD A2C: 245.0 ml  LV vol d, MOD A4C: 194.0 ml  LV vol s, MOD A2C: 187.0 ml  LV vol s, MOD A4C: 158.0 ml  LV SV MOD A2C:   58.0 ml  LV SV MOD A4C:   194.0 ml  LV SV MOD BP:   42.7 ml   LEFT ATRIUM       Index    RIGHT ATRIUM      Index  LA diam:    5.10 cm 2.21 cm/m RA Area:   25.00 cm  LA Vol (A2C):  88.5 ml 38.28 ml/m RA Volume:  82.50 ml 35.69 ml/m  LA Vol (A4C):  59.2 ml 25.61 ml/m  LA Biplane Vol: 74.5 ml 32.23 ml/m  AORTIC VALVE  LVOT Vmax:  57.70 cm/s  LVOT Vmean: 41.050 cm/s  LVOT VTI:  0.101 m    AORTA  Ao Root diam: 3.70 cm   TRICUSPID VALVE  TR Peak grad:  36.0 mmHg  TR Vmax:    300.00 cm/s    SHUNTS  Systemic VTI: 0.10 m  Systemic Diam: 2.30 cm   Dixie Dials MD  Electronically signed by Dixie Dials MD  Signature Date/Time: 11/16/2019/11:08:05  AM    CT CHEST WITHOUT CONTRAST  TECHNIQUE: Multidetector CT imaging of the chest was performed following the standard protocol without intravenous contrast. High resolution  imaging of the lungs, as well as inspiratory and expiratory imaging, was performed.  COMPARISON:  10/26/2019 chest CT angiogram.  FINDINGS: Cardiovascular: Top-normal heart size. No significant pericardial effusion/thickening. Left anterior descending coronary atherosclerosis. Mildly atherosclerotic nonaneurysmal thoracic aorta. Dilated main pulmonary artery (3.5 cm diameter).  Mediastinum/Nodes: No discrete thyroid nodules. Unremarkable esophagus. No pathologically enlarged axillary, mediastinal or hilar lymph nodes, noting limited sensitivity for the detection of hilar adenopathy on this noncontrast study. Nonenlarged coarsely calcified granulomatous right hilar nodes are unchanged.  Lungs/Pleura: No pneumothorax. Small dependent bilateral pleural effusions with mild passive atelectasis in the dependent lungs. There is patchy peripheral septal thickening, perilobular consolidation and ground-glass opacity throughout both lungs involving all lung lobes. The ground-glass component is decreased in the interval since 10/26/2019. no significant regions traction bronchiectasis or frank honeycombing. Mild paraseptal emphysema. Mild diffuse bronchial wall thickening. No central airway stenoses. No lung masses or significant pulmonary nodules. These findings persist on the prone sequence.  Upper abdomen: Cholelithiasis. New small wedge-shaped hypoattenuating region in the spleen suggestive of a small splenic infarct.  Musculoskeletal: No aggressive appearing focal osseous lesions. Moderate thoracic spondylosis.  IMPRESSION: 1. Patchy peripheral septal thickening, perilobular consolidation and ground-glass opacity throughout both lungs, with decreased ground-glass component since 10/26/2019 chest CT.  Favor slowly resolving COVID-19 pneumonia, with early postinflammatory fibrosis not excluded. Consider follow-up high-resolution chest CT study in 6 months, as clinically warranted. 2. Small dependent bilateral pleural effusions with mild passive atelectasis in the dependent lungs. 3. Dilated main pulmonary artery, suggesting pulmonary arterial hypertension. 4. One vessel coronary atherosclerosis. 5. Cholelithiasis. 6. New small wedge-shaped hypoattenuating region in the spleen suggestive of a small splenic infarct. 7. Aortic Atherosclerosis (ICD10-I70.0) and Emphysema (ICD10-J43.9).   Electronically Signed   By: Ilona Sorrel M.D.   On: 11/17/2019 18:11     Cardiac CT/CTA  TECHNIQUE: The patient was scanned on a Siemens Somatom scanner.  FINDINGS: A 120 kV prospective scan was triggered in the descending thoracic aorta at 111 HU's. Gantry rotation speed was 280 msecs and collimation was .9 mm. No beta blockade and no NTG was given. The 3D data set was reconstructed in 5% intervals of the 60-80 % of the R-R cycle. Diastolic phases were analyzed on a dedicated work station using MPR, MIP and VRT modes. The patient received 80 cc of contrast.  There is normal pulmonary vein drainage into the left atrium (2 on the right and 2 on the left) with ostial measurements as follows:  RUPV: 25.1 x 23.5 mm  RLPV: 20.0 x 16.2 mm  LUPV: 26.2 x 20.6 mm  LLPV: 21.1 x 19.5 mm  No anomalous pulmonary venous drainage. No pulmonary vein stenosis.  Normal left atrial appendage structure. Incomplete opacification of the LA appendage on arterial phase images. Though there is improved contrast opacification of the LA appendage on delayed contrast images, there is a persistent filling defect suggestive of thrombus.  Moderate left atrial chamber enlargement. Moderate-severe right atrial chamber enlargement.  The esophagus runs adjacent to left pulmonary veins.  Aorta:  Normal caliber, 35 mm at mid ascending aorta measured double oblique. No dissection or calcifications.  Aortic Valve:  Trileaflet.  No calcifications.  Coronary Arteries: Normal coronary origin. Left dominance with diminutive appearing RCA. The study was performed without use of NTG and insufficient for plaque evaluation. Coronary artery calcium score is 88, which is 37th percentile for age and sex matched peers.  Pulmonary artery: Moderately dilated main pulmonary artery, 36 mm.  Trivial anterior pericardial  effusion.  IMPRESSION: 1. There is normal pulmonary vein drainage into the left atrium. No pulmonary vein stenosis.  2. Probable left atrial appendage thrombus. See Findings for comments.  3. The esophagus runs adjacent to left pulmonary veins.  4. Coronary artery calcium score is 88, which is 37th percentile for age and sex matched peers.  5. Moderately dilated main pulmonary artery, 36 mm, may indicate pulmonary hypertension.   Electronically Signed   By: Cherlynn Kaiser   On: 04/03/2020 17:09     TRANSESOPHOGEAL ECHO REPORT       Patient Name:  Melvin Ramirez Date of Exam: 04/07/2020  Medical Rec #: [id number]   Height:    73.0 in  Accession #:  [id number]  Weight:    234.0 lb  Date of Birth: [birth year]   BSA:     2.300 m  Patient Age:  74 years   BP:      103/55 mmHg  Patient Gender: M       HR:      94 bpm.  Exam Location: Inpatient   Procedure: Transesophageal Echo, Color Doppler and Cardiac Doppler   Indications:   A-fib Pre-ablation    History:     Patient has prior history of Echocardiogram examinations,  most          recent 03/21/2020. Arrythmias:Atrial Fibrillation; Risk          Factors:Diabetes.    Sonographer:   Vickie Epley RDCS  Referring Phys: [place]  Diagnosing Phys: Sanda Klein MD   PROCEDURE: The transesophogeal probe was passed  without difficulty through  the esophogus of the patient. Sedation performed by different physician.  The patient was monitored while under deep sedation. Anesthestetic  sedation was provided intravenously by  Anesthesiology: 113m of Propofol. The patient developed no complications  during the procedure.   IMPRESSIONS    1. Left ventricular ejection fraction, by estimation, is 35 to 40%. The  left ventricle has moderately decreased function. The left ventricle  demonstrates global hypokinesis.  2. Right ventricular systolic function is normal. The right ventricular  size is normal.  3. Left atrial size was moderately dilated. A left atrial/left atrial  appendage thrombus was detected.  4. Right atrial size was moderately dilated.  5. The mitral valve is normal in structure. Mild mitral valve  regurgitation.  6. The aortic valve is tricuspid. Aortic valve regurgitation is not  visualized. No aortic stenosis is present.   FINDINGS  Left Ventricle: Left ventricular ejection fraction, by estimation, is 35  to 40%. The left ventricle has moderately decreased function. The left  ventricle demonstrates global hypokinesis. The left ventricular internal  cavity size was normal in size.  There is no left ventricular hypertrophy.   Right Ventricle: The right ventricular size is normal. No increase in  right ventricular wall thickness. Right ventricular systolic function is  normal.   Left Atrium: Left atrial size was moderately dilated. Spontaneous echo  contrast was present in the left atrial appendage. A left atrial/left  atrial appendage thrombus was detected.   Right Atrium: Right atrial size was moderately dilated.   Pericardium: There is no evidence of pericardial effusion.   Mitral Valve: The mitral valve is normal in structure. Mild mitral valve  regurgitation.   Tricuspid Valve: The tricuspid valve is normal in structure. Tricuspid  valve regurgitation is trivial.     Aortic Valve: The aortic valve is tricuspid. Aortic valve regurgitation is  not visualized. No aortic stenosis  is present.   Pulmonic Valve: The pulmonic valve was normal in structure. Pulmonic valve  regurgitation is not visualized.   Aorta: The aortic root, ascending aorta, aortic arch and descending aorta  are all structurally normal, with no evidence of dilitation or  obstruction.   IAS/Shunts: No atrial level shunt detected by color flow Doppler.   Sanda Klein MD  Electronically signed by Sanda Klein MD  Signature Date/Time: 04/07/2020/4:54:18 PM      TRANSESOPHOGEAL ECHO REPORT       Patient Name:  RAYKWON HOBBS Date of Exam: 05/18/2020  Medical Rec #: [id number]   Height:    73.0 in  Accession #:  [id number]  Weight:    245.0 lb  Date of Birth: [birth year]   BSA:     2.345 m  Patient Age:  89 years   BP:      133/89 mmHg  Patient Gender: M       HR:      70 bpm.  Exam Location: Inpatient   Procedure: Transesophageal Echo, Color Doppler and Cardiac Doppler   Indications:   Left atrial thrombus    History:     Patient has prior history of Echocardiogram examinations,  most          recent 04/07/2020. CHF, COPD; Risk Factors:Diabetes. CKD.    Sonographer:   Clayton Lefort RDCS (AE)  Referring Phys: [place]  Diagnosing Phys: [name] MD   PROCEDURE: After discussion of the risks and benefits of a TEE, an  informed consent was obtained from the patient. The transesophogeal probe  was passed without difficulty through the esophogus of the patient. Local  oropharyngeal anesthetic was provided  with Cetacaine. Sedation performed by performing physician. Patients was  under conscious sedation during this procedure. Anesthetic administered:  78mg of Fentanyl, 4.052mof Versed. Image quality was adequate. The  patient developed no complications  during the procedure.   IMPRESSIONS     1. Difficult to assess LV function due to frequent PVCs and irregular HR  with atrial fibrillation. Left ventricular ejection fraction, by  estimation, is 35 to 40%. The left ventricle has moderately decreased  function. The left ventricle demonstrates  global hypokinesis. The left ventricular internal cavity size was mildly  dilated.  2. Right ventricular systolic function is mildly reduced. The right  ventricular size is mildly enlarged. There is normal pulmonary artery  systolic pressure.  3. There was no thrombus in the LA or appendage, but there was smoke  noted in the LA appendage. The appendage was relatively small. Left atrial  size was moderately dilated.  4. Right atrial size was moderately dilated.  5. No PFO or ASD by color doppler.  6. The mitral valve is normal in structure. Mild mitral valve  regurgitation. No evidence of mitral stenosis.  7. The aortic valve is tricuspid. Aortic valve regurgitation is not  visualized. No aortic stenosis is present.  8. Peak RV-RA gradient 24 mmHg.  9. Normal caliber thoracic aorta with mild plaque.   FINDINGS  Left Ventricle: Difficult to assess LV function due to frequent PVCs and  irregular HR with atrial fibrillation. Left ventricular ejection fraction,  by estimation, is 35 to 40%. The left ventricle has moderately decreased  function. The left ventricle  demonstrates global hypokinesis. The left ventricular internal cavity size  was mildly dilated. There is no left ventricular hypertrophy.   Right Ventricle: The right ventricular size is mildly enlarged. No  increase in  right ventricular wall thickness. Right ventricular systolic  function is mildly reduced. There is normal pulmonary artery systolic  pressure. The tricuspid regurgitant velocity  is 2.45 m/s, and with an assumed right atrial pressure of 10 mmHg, the  estimated right ventricular systolic pressure is 16.1 mmHg.   Left Atrium: There was no  thrombus in the LA or appendage, but there was  smoke noted in the LA appendage. The appendage was relatively small. Left  atrial size was moderately dilated. No left atrial/left atrial appendage  thrombus was detected.   Right Atrium: Right atrial size was moderately dilated.   Pericardium: There is no evidence of pericardial effusion.   Mitral Valve: The mitral valve is normal in structure. Mild mitral valve  regurgitation. No evidence of mitral valve stenosis.   Tricuspid Valve: Peak RV-RA gradient 24 mmHg. The tricuspid valve is  normal in structure. Tricuspid valve regurgitation is trivial.   Aortic Valve: The aortic valve is tricuspid. Aortic valve regurgitation is  not visualized. No aortic stenosis is present.   Pulmonic Valve: The pulmonic valve was normal in structure. Pulmonic valve  regurgitation is not visualized.   Aorta: Normal caliber thoracic aorta with mild plaque. The aortic root is  normal in size and structure.   IAS/Shunts: No PFO or ASD by color doppler.     TRICUSPID VALVE  TR Peak grad:  24.0 mmHg  TR Vmax:    245.00 cm/s   [name] MD  Electronically signed by [name] MD  Signature Date/Time: 05/18/2020/4:15:45 PM     RIGHT/LEFT HEART CATH AND CORONARY ANGIOGRAPHY  Conclusion  1.  Nonobstructive mild coronary disease.  2.  Elevated R>L heart filling pressures.  3.  Pulmonary venous hypertension.  4.  Reduced cardiac output (CI 2.2 by thermo, 1.9 by Fick).   Nonischemic cardiomyopathy.  Elevated filling pressures in setting of pre-hydration before cath (baseline CKD).  About 40 cc contrast used with this study. He will need to start on torsemide 20 mg daily x 2 days then 10 mg daily after that (has been taking 10 mg prn).  Will need BMET in 1 week and followup with me.  Procedural Details  Technical Details Procedure: Right Heart Cath, Left Heart Cath, Selective Coronary Angiography  Indication: CHF, pre-Maze procedure    Procedural Details: The right brachial and radial areas were prepped, draped, and anesthetized with 1% lidocaine. There was a pre-existing peripheral IV that was replaced with a 6/7 F sheath. A Swan-Ganz catheter was used for the right heart catheterization. Standard protocol was followed for recording of right heart pressures and sampling of oxygen saturations. Fick and thermodilution cardiac output was calculated. The right radial artery was entered using modified Seldinger technique and a 68F sheath was placed.  The patient received 3 mg IA verapamil and weight-based IV heparin.  Standard Judkins catheters were used for selective coronary angiography. There were no immediate procedural complications. The patient was transferred to the post catheterization recovery area for further monitoring.  Estimated blood loss <50 mL.   During this procedure medications were administered to achieve and maintain moderate conscious sedation while the patient's heart rate, blood pressure, and oxygen saturation were continuously monitored and I was present face-to-face 100% of this time.  Medications (Filter: Administrations occurring from 0960 to 0955 on 07/21/20) (important) Continuous medications are totaled by the amount administered until 07/21/20 0955.  Heparin (Porcine) in NaCl 1000-0.9 UT/500ML-% SOLN (mL) Total volume:  1,000 mL Date/Time  Rate/Dose/Volume Action  07/21/20  0900  500 mL Given  0900  500 mL Given    fentaNYL (SUBLIMAZE) injection (mcg) Total dose:  25 mcg Date/Time  Rate/Dose/Volume Action  07/21/20 0921  25 mcg Given    midazolam (VERSED) injection (mg) Total dose:  1 mg Date/Time  Rate/Dose/Volume Action  07/21/20 0921  1 mg Given    lidocaine (PF) (XYLOCAINE) 1 % injection (mL) Total volume:  4 mL Date/Time  Rate/Dose/Volume Action  07/21/20 0921  4 mL Given    Radial Cocktail/Verapamil only (mL) Total volume:  10 mL Date/Time  Rate/Dose/Volume Action  07/21/20 0933   10 mL Given    heparin sodium (porcine) injection (Units) Total dose:  5,000 Units Date/Time  Rate/Dose/Volume Action  07/21/20 0935  5,000 Units Given    iohexol (OMNIPAQUE) 350 MG/ML injection (mL) Total volume:  40 mL Date/Time  Rate/Dose/Volume Action  07/21/20 0951  40 mL Given    Sedation Time  Sedation Time Physician-1: 28 minutes 23 seconds  Contrast  Medication Name Total Dose  iohexol (OMNIPAQUE) 350 MG/ML injection 40 mL    Radiation/Fluoro  Fluoro time: 6.1 (min) DAP: 55974 (mGycm2) Cumulative Air Kerma: 1638 (mGy)  Coronary Findings  Diagnostic Dominance: Left Left Anterior Descending  40% stenosis ostial D2. Minimal luminal irregularities in LAD.  Left Circumflex  Large, dominant vessel. 40% stenosis proximal left PDA.  Right Coronary Artery  Small, nondominant vessel with no significant coronary disease.  Intervention  No interventions have been documented. Right Heart  Right Heart Pressures RHC Procedural Findings: Hemodynamics (mmHg) RA mean 18 RV 41/17 PA 51/23, mean 32 PCWP mean 22 LV 101/20 AO 106/64  Oxygen saturations: PA 63% AO 99%  Cardiac Output (Thermo) 5.35 Cardiac Index (Thermo) 2.2 PVR 1.9 WU  Cardiac Output (Fick) 4.65  Cardiac Index (Fick) 1.91  Implants   No implant documentation for this case.  Syngo Images  Show images for CARDIAC CATHETERIZATION Images on Long Term Storage  Show images for Tavonte, Seybold "[employer] Link to Procedure Log  Procedure Log    Hemo Data   Most Recent Value  Fick Cardiac Output 4.65 L/min  Fick Cardiac Output Index 1.91 (L/min)/BSA  Thermal Cardiac Output 5.35 L/min  Thermal Cardiac Output Index 2.2 (L/min)/BSA  TPVR Index 14.54 HRUI  TSVR Index 37.27 HRUI  PVR SVR Ratio 0.16  TPVR/TSVR Ratio 0.39        Impression:  Patient has recurrent persistent atrial fibrillation that has failed multiple attempts at DC cardioversion and chemical therapy with both  Tikosyn and amiodarone.  The patient's history of atrial fibrillation developed initially during acute COVID-19 pneumonia in January of this year.  The patient continues to experience exertional shortness of breath, fatigue, and decreased exercise tolerance, all of which dates back to his COVID-19 pneumonia.  Last follow-up CT scan of the chest revealed interstitial opacities in both lungs suspicious for possible COVID-19 related interstitial lung disease.  I have personally reviewed the patient's most recent transesophageal echocardiogram and diagnostic cardiac catheterization.  Catheterization reveals at least moderate left ventricular systolic dysfunction with only moderate enlargement of the left atrium.  There is mild right ventricular chamber enlargement and systolic dysfunction, and recent diagnostic cardiac catheterization revealed significant pulmonary hypertension with right-sided filling pressures notably greater than left sided filling pressures.  PA pressures measured 51/23 with mean central venous pressure 18.  Pulmonary capillary wedge pressure was 22.  Thermodilution cardiac index was 2.2.  PVR measured 1.9 Woods units.  The patient did  not have significant coronary artery disease.    Plan:  I discussed long-term treatment options for management of recurrent persistent atrial fibrillation at length with the patient and his wife in the office today.  We compared and contrasted long-term anticoagulation and rate control with other attempts to restore sinus rhythm, such as catheter-based ablation or surgical Maze procedure.  Whereas it is possible that the patient's left ventricular systolic dysfunction may be related to tachycardia mediated cardiomyopathy.  If true then I would expect that with adequate medical therapy LV function should improve over time.  I remain concerned that the patient's biggest problem could possibly be related to significant interstitial lung disease related to his  original COVID-19 infection.  There is no question that under his circumstances risks associated with surgical Maze procedure would be somewhat elevated.  Moreover, patient is very reluctant to consider open heart surgery for multiple reasons and does not wish to miss much time at work.  As a next step I favor follow-up high resolution CT scan of the chest and referral back to Dr. Lamonte Sakai in the pulmonary medicine clinic to reassess possibility of significant interstitial lung disease.  In the presence of significant interstitial lung disease I would be reluctant to consider this patient a candidate for surgical intervention and it might be best to stick with a plan for long-term rate control and chronic anticoagulation.  If the patient is not felt to have significant lung disease it might make sense to reconsider an attempt at catheter-based ablation even though the likelihood of long-term success could be suboptimal, as this would certainly be associated with less risks than surgical intervention.  We will proceed with follow-up CT scanning and referral to Dr. Lamonte Sakai in the pulmonary medicine clinic.  Patient will return to our office for follow-up in approximately 2 months.   All of his questions have been addressed.   I spent in excess of 90 minutes during the conduct of this office consultation and >50% of this time involved direct face-to-face encounter with the patient for counseling and/or coordination of their care.   Valentina Gu. Roxy Manns, MD 08/22/2020 4:23 PM

## 2020-08-22 NOTE — Patient Instructions (Signed)
Continue all previous medications without any changes at this time  

## 2020-08-23 ENCOUNTER — Other Ambulatory Visit: Payer: Self-pay | Admitting: *Deleted

## 2020-08-23 DIAGNOSIS — I7101 Dissection of thoracic aorta: Secondary | ICD-10-CM

## 2020-08-23 DIAGNOSIS — J849 Interstitial pulmonary disease, unspecified: Secondary | ICD-10-CM

## 2020-08-23 DIAGNOSIS — I71019 Dissection of thoracic aorta, unspecified: Secondary | ICD-10-CM

## 2020-08-26 ENCOUNTER — Encounter (HOSPITAL_COMMUNITY): Payer: Self-pay

## 2020-08-26 ENCOUNTER — Other Ambulatory Visit (HOSPITAL_COMMUNITY): Payer: Self-pay | Admitting: *Deleted

## 2020-08-26 MED ORDER — ATORVASTATIN CALCIUM 10 MG PO TABS
10.0000 mg | ORAL_TABLET | Freq: Every day | ORAL | 3 refills | Status: DC
Start: 2020-08-26 — End: 2021-05-08

## 2020-08-26 MED ORDER — DIGOXIN 125 MCG PO TABS
0.0625 mg | ORAL_TABLET | Freq: Every day | ORAL | 3 refills | Status: DC
Start: 2020-08-26 — End: 2021-07-20

## 2020-08-26 MED ORDER — ENTRESTO 24-26 MG PO TABS: ORAL_TABLET | ORAL | 3 refills | Status: DC

## 2020-08-26 MED ORDER — TORSEMIDE 10 MG PO TABS: 10.0000 mg | ORAL_TABLET | Freq: Every day | ORAL | 6 refills | Status: DC

## 2020-08-26 MED ORDER — CARVEDILOL 12.5 MG PO TABS
12.5000 mg | ORAL_TABLET | Freq: Two times a day (BID) | ORAL | 3 refills | Status: DC
Start: 2020-08-26 — End: 2021-11-13

## 2020-08-26 MED ORDER — DAPAGLIFLOZIN PROPANEDIOL 10 MG PO TABS
10.0000 mg | ORAL_TABLET | Freq: Every day | ORAL | 3 refills | Status: DC
Start: 2020-08-26 — End: 2021-10-04

## 2020-08-26 MED ORDER — APIXABAN 5 MG PO TABS
5.0000 mg | ORAL_TABLET | Freq: Two times a day (BID) | ORAL | 3 refills | Status: DC
Start: 2020-08-26 — End: 2021-11-08

## 2020-08-27 DIAGNOSIS — H40033 Anatomical narrow angle, bilateral: Secondary | ICD-10-CM | POA: Diagnosis not present

## 2020-08-27 DIAGNOSIS — H2513 Age-related nuclear cataract, bilateral: Secondary | ICD-10-CM | POA: Diagnosis not present

## 2020-09-07 ENCOUNTER — Telehealth: Payer: Self-pay

## 2020-09-07 NOTE — Telephone Encounter (Signed)
-----   Message from Rexene Alberts, MD sent at 09/07/2020  2:03 PM EST ----- Regarding: RE: upcoming scans/question Contact: [phone] Please cancel the CTA chest/abd/pelvis and just do high resolution CT chest w/out contrast ----- Message ----- From: Marylen Ponto, LPN Sent: 37/06/4445  12:43 PM EST To: Rexene Alberts, MD Subject: upcoming scans/question                        [facility]) called to inquire about Melvin Ramirez upcoming scans scheduled for 09/12/20. He is scheduled to have a CTA C/A/P and CT Chest high resolution. They are going to draw his labs that morning BUT he has a history of elevated Creatinine levels. If that is the case and his levels are HIGH again. They will not be able to do the dye. Are you ok with a CT C/A/P without? And no high resolution Chest. Please advise Linden Dolin

## 2020-09-07 NOTE — Telephone Encounter (Signed)
[facility] was notified of change.

## 2020-09-09 ENCOUNTER — Other Ambulatory Visit: Payer: Self-pay | Admitting: Thoracic Surgery (Cardiothoracic Vascular Surgery)

## 2020-09-12 ENCOUNTER — Ambulatory Visit
Admission: RE | Admit: 2020-09-12 | Discharge: 2020-09-12 | Disposition: A | Discharge status: Home / Self Care | Payer: Commercial Managed Care - PPO | Source: Ambulatory Visit | Attending: Thoracic Surgery (Cardiothoracic Vascular Surgery) | Admitting: Thoracic Surgery (Cardiothoracic Vascular Surgery)

## 2020-09-12 ENCOUNTER — Inpatient Hospital Stay: Admission: RE | Admit: 2020-09-12 | Payer: Commercial Managed Care - PPO | Source: Ambulatory Visit

## 2020-09-12 ENCOUNTER — Other Ambulatory Visit: Payer: Commercial Managed Care - PPO

## 2020-09-12 DIAGNOSIS — J849 Interstitial pulmonary disease, unspecified: Secondary | ICD-10-CM

## 2020-09-25 ENCOUNTER — Encounter: Payer: Self-pay | Admitting: Thoracic Surgery (Cardiothoracic Vascular Surgery)

## 2020-10-04 ENCOUNTER — Encounter: Payer: Self-pay | Admitting: *Deleted

## 2020-10-13 DIAGNOSIS — Z961 Presence of intraocular lens: Secondary | ICD-10-CM | POA: Diagnosis not present

## 2020-10-13 DIAGNOSIS — H2513 Age-related nuclear cataract, bilateral: Secondary | ICD-10-CM | POA: Diagnosis not present

## 2020-10-13 DIAGNOSIS — H52201 Unspecified astigmatism, right eye: Secondary | ICD-10-CM | POA: Diagnosis not present

## 2020-10-13 DIAGNOSIS — H2511 Age-related nuclear cataract, right eye: Secondary | ICD-10-CM | POA: Diagnosis not present

## 2020-10-14 DIAGNOSIS — H2512 Age-related nuclear cataract, left eye: Secondary | ICD-10-CM | POA: Diagnosis not present

## 2020-10-18 ENCOUNTER — Ambulatory Visit (INDEPENDENT_AMBULATORY_CARE_PROVIDER_SITE_OTHER): Payer: Commercial Managed Care - PPO | Admitting: Emergency Medicine

## 2020-10-18 ENCOUNTER — Encounter: Payer: Self-pay | Admitting: Emergency Medicine

## 2020-10-18 ENCOUNTER — Other Ambulatory Visit: Payer: Self-pay

## 2020-10-18 DIAGNOSIS — J849 Interstitial pulmonary disease, unspecified: Secondary | ICD-10-CM | POA: Diagnosis not present

## 2020-10-18 DIAGNOSIS — J449 Chronic obstructive pulmonary disease, unspecified: Secondary | ICD-10-CM

## 2020-10-18 DIAGNOSIS — I4819 Other persistent atrial fibrillation: Secondary | ICD-10-CM | POA: Diagnosis not present

## 2020-10-18 NOTE — Progress Notes (Signed)
Subjective:    Patient ID: Melvin Ramirez, male    DOB: [birth year], 75 y.o.   MRN: [id number]  HPI 75 year old former smoker (70 pack years) with probable COPD.  Carries diagnosis of diabetes with neuropathy and paresthesias, renal cell carcinoma post nephrectomy (remote) with associated chronic renal insufficiency.  He and his wife both unfortunately had COVID-19 in early January.  He required hospital admission 11/15/2019.  He did get the monocloncal antibody.  He was discharged on 11/26/2019.  Hospital course was complicated by some paroxysmal atrial fibrillation, gastric ulcers (EGD > clipped), lower extremity DVT for which she was started on Eliquis.  High-resolution CT scan of the chest was performed to evaluate pulmonary infiltrates.  This confirmed patchy bilateral paraseptal groundglass opacities, slightly improving compared with 10/26/2019, small dependent bilateral pleural effusions, dilated main pulmonary artery consistent with PAH.he was treated empirically for possible superimposed bacterial PNA. He does not feel back to full strength, does still have some cough. Does feel very week. He lost about 40 lbs through the illness. He is not back to work yet but wants to do so. Has not seen any melena, BRBPR. LVEF 25-30%. Dr Oval Linsey has recommended a stress test and a cardioversion on 3/11.   Autoimmune labs were sent 2/9 during his hospitalization, reviewed.  CCP, anti-SCL 70, SSA and SSB, RF, Double-stranded DNA, anti-GBM, ANCA, ANA were all negative.  ROV 02/25/20 --follow-up visit for 75 year old former smoker, history of diabetes, renal cell carcinoma post nephrectomy, associated chronic renal insufficiency.  He had COVID-19 in January 8099, course complicated by A. fib, gastric ulcer and lower extremity DVT that was treated with Eliquis.  He had a CT scan of the chest on 01/20/2020 which I have reviewed, shows mild basilar irregular interstitial opacities and occasional groundglass without any  honeycomb change, mild paraseptal emphysema. Improved compared with 11/17/19.  His pulmonary function testing is not yet been done because his COVID testing stayed positive.  He is working - does get fatigued with exertion, not really SOB. No cough, no wheeze or CP   He is planning to undergo DC cardioversion for his atrial fibrillation 02/29/2020, started on dofetilide in prep for this  R OV 10/18/2020 --75 year old gentleman, former smoker with diabetes, renal cell carcinoma (nephrectomy), chronic renal insufficiency.  He had COVID-19 in 05/3381 that was complicated by atrial fibrillation, gastric ulcer, lower extremity DVT on Eliquis.  He had a CT chest that showed basilar irregular interstitial opacities post COVID with some groundglass, no UIP pattern.  He has remained in atrial fibrillation, has been considered for possible maze or ablation. Dr Roxy Manns believes that ablation would be preferred. He is going to see Dr Rayann Heman on 1/31.  He has exertional SOB with heavier exertion like climbing stairs, with moving heavier objects. He is able to [facility] but has to stop to rest during. He is back to work, driving and moving things in warehouse. He is not on any BD's currently.   High-resolution CT chest 09/12/2020 reviewed by me, shows some bilateral dependent basilar scarring, again not in a UIP pattern, small left pleural effusion  MDM:  Reviewed TCTS note 08/22/20 Reviewed L/R cath 07/21/20   Review of Systems  Constitutional: Negative for fever and unexpected weight change.  HENT: Negative for congestion, dental problem, ear pain, nosebleeds, postnasal drip, rhinorrhea, sinus pressure, sneezing, sore throat and trouble swallowing.   Eyes: Negative for redness and itching.  Respiratory: Positive for shortness of breath. Negative for cough, chest tightness and  wheezing.   Cardiovascular: Negative for palpitations and leg swelling.  Gastrointestinal: Negative for nausea and vomiting.  Genitourinary: Negative  for dysuria.  Musculoskeletal: Negative for joint swelling.  Skin: Negative for rash.  Allergic/Immunologic: Negative.  Negative for environmental allergies, food allergies and immunocompromised state.  Neurological: Negative for headaches.  Hematological: Does not bruise/bleed easily.  Psychiatric/Behavioral: Negative for dysphoric mood. The patient is not nervous/anxious.     Past Medical History:  Diagnosis Date  . Chronic combined systolic and diastolic heart failure ([place]) 12/13/2019  . Chronic kidney disease   . CKD (chronic kidney disease) stage 3, GFR 30-59 ml/min (HCC) 12/13/2019  . Diabetes mellitus ([place])   . Gastric ulcer 12/13/2019   +H. Pylori. Bleeding required clipping.  . H/O blood clots   . History of kidney cancer   . Persistent atrial fibrillation ([place]) 12/13/2019  . Renal cell carcinoma ([place]) 12/13/2019   S/p nephrectomy     Family History  Problem Relation Age of Onset  . Cancer Mother   . Heart attack Father   . Heart attack Brother   . Heart disease Sister   . Stroke Paternal Grandmother      Social History   Socioeconomic History  . Marital status: Married    Spouse name: Not on file  . Number of children: Not on file  . Years of education: Not on file  . Highest education level: Not on file  Occupational History  . Occupation: truck Geophysicist/field seismologist  Tobacco Use  . Smoking status: Former Smoker    Packs/day: 2.00    Years: 35.00    Pack years: 70.00    Types: Cigarettes    Quit date: 2000    Years since quitting: 22.0  . Smokeless tobacco: Never Used  Vaping Use  . Vaping Use: Never used  Substance and Sexual Activity  . Alcohol use: Yes    Alcohol/week: 1.0 - 2.0 standard drink    Types: 1 - 2 Cans of beer per week    Comment: Socially  . Drug use: No  . Sexual activity: Not on file  Other Topics Concern  . Not on file  Social History Narrative   Lives with wife in a one story home.  Has 3 children.     Works as a Administrator.     Education:  12th grade.   Social Determinants of Health   Financial Resource Strain: Not on file  Food Insecurity: Not on file  Transportation Needs: Not on file  Physical Activity: Not on file  Stress: Not on file  Social Connections: Not on file  Intimate Partner Violence: Not on file   Truck driver, loads and unloads insulation.  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 In [place]. Once worked at [employer], [place]. Had a dust exposure.  No mold exposure.   Allergies  Allergen Reactions  . Aspirin Other (See Comments)    Bruises easily   . Novocain [Procaine] Other (See Comments)    Syncope (passed out)      Outpatient Medications Prior to Visit  Medication Sig Dispense Refill  . ACCU-CHEK GUIDE test strip daily. for testing    . Accu-Chek Softclix Lancets lancets     . acetaminophen (TYLENOL) 500 MG tablet Take 1,000 mg by mouth every 6 (six) hours as needed for mild pain or headache.    Marland Kitchen amiodarone (PACERONE) 200 MG tablet TAKE 1 TABLET BY MOUTH TWICE A DAY (Patient taking differently: daily. )  180 tablet 1  . apixaban (ELIQUIS) 5 MG TABS tablet Take 1 tablet (5 mg total) by mouth 2 (two) times daily. 180 tablet 3  . ascorbic acid (SM VITAMIN C) 1000 MG tablet Take 1,000 mg by mouth every evening.     Marland Kitchen atorvastatin (LIPITOR) 10 MG tablet Take 1 tablet (10 mg total) by mouth daily. 90 tablet 3  . Blood Glucose Monitoring Suppl (ACCU-CHEK GUIDE ME) w/Device KIT See admin instructions.    . carvedilol (COREG) 12.5 MG tablet Take 1 tablet (12.5 mg total) by mouth in the morning and at bedtime. 180 tablet 3  . dapagliflozin propanediol (FARXIGA) 10 MG TABS tablet Take 1 tablet (10 mg total) by mouth daily before breakfast. 90 tablet 3  . digoxin (LANOXIN) 0.125 MG tablet Take 0.5 tablets (0.0625 mg total) by mouth daily. 45 tablet 3  . Garlic 2244 MG CAPS Take 1,000 mg by mouth every evening.    . Glucosamine HCl 1000 MG TABS Take 1,000 mg by mouth every evening.    .  Multiple Vitamin (MULTIVITAMIN WITH MINERALS) TABS tablet Take 1 tablet by mouth daily with breakfast.     . Omega-3 Fatty Acids (FISH OIL PO) Take 350 mg by mouth every evening.     . pantoprazole (PROTONIX) 40 MG tablet Take 40 mg by mouth every evening.     . sacubitril-valsartan (ENTRESTO) 24-26 MG TAKE 1 TABLET BY MOUTH 2 (TWO) TIMES DAILY. 180 tablet 3  . Saw Palmetto, Serenoa repens, (SAW PALMETTO PO) Take 540 mg by mouth every evening.     . torsemide (DEMADEX) 10 MG tablet Take 1 tablet (10 mg total) by mouth daily. 90 tablet 6   No facility-administered medications prior to visit.        Objective:   Physical Exam Vitals:   10/18/20 1626  BP: (!) 144/82  Pulse: 72  Temp: (!) 97 F (36.1 C)  SpO2: 97%  Weight: 238 lb 6.4 oz (108.1 kg)  Height: 6' 1"  (1.854 m)   Gen: Pleasant, well-nourished, in no distress,  normal affect  ENT: No lesions,  mouth clear,  oropharynx clear, no postnasal drip  Neck: No JVD, no stridor  Lungs: No use of accessory muscles, distant, coarse, no wheeze  Cardiovascular: RRR, heart sounds normal, no murmur or gallops, trace peripheral edema  Musculoskeletal: No deformities, no cyanosis or clubbing  Neuro: alert, awake, non focal  Skin: Warm, no lesions or rash     Assessment & Plan:  ILD (interstitial lung disease) (HCC) Residual interstitial changes post-COVID.  No evidence of progression.  Unclear we need to follow serial imaging at any set interval.  His autoimmune evaluation is negative.  Consider repeating his CT chest if/when he has any change in his breathing, functional capacity.  COPD (chronic obstructive pulmonary disease) ([place]) Suspect he does have clinically relevant COPD.  He is not currently on bronchodilators.  His functional past is actually quite good but he does have exertional dyspnea.  I think he still needs PFT to quantify his degree of obstruction.  He may benefit from bronchodilators going forward  Persistent  atrial fibrillation [facility]) He is planning to talk to Dr. Rayann Heman about possible catheter ablation.  He would likely be low risk for the procedure given his overall functional capacity.  His PFT are still pending.  His CT chest is overall reassuring  Baltazar Apo, MD, PhD 10/18/2020, 5:23 PM [facility] [phone] or if no answer [phone]

## 2020-10-18 NOTE — Assessment & Plan Note (Signed)
Residual interstitial changes post-COVID.  No evidence of progression.  Unclear we need to follow serial imaging at any set interval.  His autoimmune evaluation is negative.  Consider repeating his CT chest if/when he has any change in his breathing, functional capacity.

## 2020-10-18 NOTE — Patient Instructions (Addendum)
Your CT chest shows subtle stable bilateral areas of scarring.  There has been no progression related to residual COVID 19.  This is good news..  We will arrange for pulmonary function testing at your next office visit Depending on your pulmonary function testing you might benefit from inhaled medication to help your breathing in the future. You are low risk to undergo possible repeat cardioversion or catheter ablation for your atrial fibrillation with Dr. Rayann Heman if this is his recommendation. Follow with Dr. Lamonte Sakai next available with full pulmonary function testing on the same day.

## 2020-10-18 NOTE — Assessment & Plan Note (Signed)
Suspect he does have clinically relevant COPD.  He is not currently on bronchodilators.  His functional past is actually quite good but he does have exertional dyspnea.  I think he still needs PFT to quantify his degree of obstruction.  He may benefit from bronchodilators going forward

## 2020-10-18 NOTE — Assessment & Plan Note (Signed)
He is planning to talk to Dr. Rayann Heman about possible catheter ablation.  He would likely be low risk for the procedure given his overall functional capacity.  His PFT are still pending.  His CT chest is overall reassuring

## 2020-10-31 ENCOUNTER — Encounter: Payer: Commercial Managed Care - PPO | Admitting: Thoracic Surgery (Cardiothoracic Vascular Surgery)

## 2020-11-03 DIAGNOSIS — H2512 Age-related nuclear cataract, left eye: Secondary | ICD-10-CM | POA: Diagnosis not present

## 2020-11-03 DIAGNOSIS — Z961 Presence of intraocular lens: Secondary | ICD-10-CM | POA: Diagnosis not present

## 2020-11-03 DIAGNOSIS — H2513 Age-related nuclear cataract, bilateral: Secondary | ICD-10-CM | POA: Diagnosis not present

## 2020-11-07 ENCOUNTER — Ambulatory Visit (INDEPENDENT_AMBULATORY_CARE_PROVIDER_SITE_OTHER): Payer: Commercial Managed Care - PPO | Admitting: Internal Medicine

## 2020-11-07 ENCOUNTER — Other Ambulatory Visit: Payer: Self-pay

## 2020-11-07 ENCOUNTER — Encounter: Payer: Self-pay | Admitting: *Deleted

## 2020-11-07 ENCOUNTER — Encounter: Payer: Self-pay | Admitting: Internal Medicine

## 2020-11-07 VITALS — BP 110/64 | HR 70 | Ht 73.0 in | Wt 240.8 lb

## 2020-11-07 DIAGNOSIS — I4819 Other persistent atrial fibrillation: Secondary | ICD-10-CM

## 2020-11-07 DIAGNOSIS — I428 Other cardiomyopathies: Secondary | ICD-10-CM | POA: Diagnosis not present

## 2020-11-07 DIAGNOSIS — I4891 Unspecified atrial fibrillation: Secondary | ICD-10-CM | POA: Diagnosis not present

## 2020-11-07 DIAGNOSIS — D6869 Other thrombophilia: Secondary | ICD-10-CM

## 2020-11-07 MED ORDER — AMIODARONE HCL 200 MG PO TABS: 200.0000 mg | ORAL_TABLET | Freq: Every day | ORAL | 3 refills | Status: DC

## 2020-11-07 NOTE — H&P (View-Only) (Signed)
PCP: [name], MD Primary Cardiologist: Dr Aundra Dubin Primary EP: Dr Hassell Halim is a 75 y.o. male who presents today for routine electrophysiology followup.  Since last being seen in our clinic, the patient reports doing very well.  Today, he denies symptoms of palpitations, chest pain, shortness of breath,  lower extremity edema, dizziness, presyncope, or syncope.  The patient is otherwise without complaint today.   Past Medical History:  Diagnosis Date  . Chronic combined systolic and diastolic heart failure ([place]) 12/13/2019  . Chronic kidney disease   . CKD (chronic kidney disease) stage 3, GFR 30-59 ml/min (HCC) 12/13/2019  . Diabetes mellitus ([place])   . Gastric ulcer 12/13/2019   +H. Pylori. Bleeding required clipping.  . H/O blood clots   . History of kidney cancer   . Persistent atrial fibrillation ([place]) 12/13/2019  . Renal cell carcinoma ([place]) 12/13/2019   S/p nephrectomy   Past Surgical History:  Procedure Laterality Date  . APPENDECTOMY    . ATRIAL FIBRILLATION ABLATION N/A 04/07/2020   Procedure: ATRIAL FIBRILLATION ABLATION;  Surgeon: [name], MD;  Location: [place];  Service: Cardiovascular;  Laterality: N/A;  . BIOPSY  11/19/2019   Procedure: BIOPSY;  Surgeon: Ronald Lobo, MD;  Location: [facility];  Service: Endoscopy;;  . CARDIOVERSION N/A 12/17/2019   Procedure: CARDIOVERSION;  Surgeon: Geralynn Rile, MD;  Location: [place];  Service: Cardiovascular;  Laterality: N/A;  . CARDIOVERSION N/A 02/18/2020   Procedure: CARDIOVERSION;  Surgeon: Jerline Pain, MD;  Location[facility]COPY;  Service: Cardiovascular;  Laterality: N/A;  . CARDIOVERSION N/A 02/29/2020   Procedure: CARDIOVERSION;  Surgeon: Larey Dresser, MD;  Location[facility] ENDOSCOPY;  Service: Cardiovascular;  Laterality: N/A;  . CARDIOVERSION N/A 06/06/2020   Procedure: CARDIOVERSION;  Surgeon: Larey Dresser, MD;  Location: [facility] ENDOSCOPY;  Service: Cardiovascular;   Laterality: N/A;  . ESOPHAGOGASTRODUODENOSCOPY N/A 11/19/2019   Procedure: ESOPHAGOGASTRODUODENOSCOPY (EGD);  Surgeon: Ronald Lobo, MD;  Location: [facility] ENDOSCOPY;  Service: Endoscopy;  Laterality: N/A;  . HEMOSTASIS CLIP PLACEMENT  11/19/2019   Procedure: HEMOSTASIS CLIP PLACEMENT;  Surgeon: Ronald Lobo, MD;  Location: [facility];  Service: Endoscopy;;  . NEPHRECTOMY    . RIGHT/LEFT HEART CATH AND CORONARY ANGIOGRAPHY N/A 07/21/2020   Procedure: RIGHT/LEFT HEART CATH AND CORONARY ANGIOGRAPHY;  Surgeon: Larey Dresser, MD;  Location: [facility];  Service: Cardiovascular;  Laterality: N/A;  . TEE WITHOUT CARDIOVERSION N/A 05/18/2020   Procedure: TRANSESOPHAGEAL ECHOCARDIOGRAM (TEE);  Surgeon: Larey Dresser, MD;  Location: [facility] ENDOSCOPY;  Service: Cardiovascular;  Laterality: N/A;    ROS- all systems are reviewed and negatives except as per HPI above  Current Outpatient Medications  Medication Sig Dispense Refill  . ACCU-CHEK GUIDE test strip daily. for testing    . Accu-Chek Softclix Lancets lancets     . acetaminophen (TYLENOL) 500 MG tablet Take 1,000 mg by mouth every 6 (six) hours as needed for mild pain or headache.    Marland Kitchen amiodarone (PACERONE) 200 MG tablet TAKE 1 TABLET BY MOUTH TWICE A DAY (Patient taking differently: daily. ) 180 tablet 1  . apixaban (ELIQUIS) 5 MG TABS tablet Take 1 tablet (5 mg total) by mouth 2 (two) times daily. 180 tablet 3  . ascorbic acid (SM VITAMIN C) 1000 MG tablet Take 1,000 mg by mouth every evening.     Marland Kitchen atorvastatin (LIPITOR) 10 MG tablet Take 1 tablet (10 mg total) by mouth daily. 90 tablet 3  .  Blood Glucose Monitoring Suppl (ACCU-CHEK GUIDE ME) w/Device KIT See admin instructions.    . carvedilol (COREG) 12.5 MG tablet Take 1 tablet (12.5 mg total) by mouth in the morning and at bedtime. 180 tablet 3  . dapagliflozin propanediol (FARXIGA) 10 MG TABS tablet Take 1 tablet (10 mg total) by mouth daily before breakfast. 90 tablet 3  .  digoxin (LANOXIN) 0.125 MG tablet Take 0.5 tablets (0.0625 mg total) by mouth daily. 45 tablet 3  . Garlic 6045 MG CAPS Take 1,000 mg by mouth every evening.    . Glucosamine HCl 1000 MG TABS Take 1,000 mg by mouth every evening.    . Multiple Vitamin (MULTIVITAMIN WITH MINERALS) TABS tablet Take 1 tablet by mouth daily with breakfast.     . Omega-3 Fatty Acids (FISH OIL PO) Take 350 mg by mouth every evening.     . pantoprazole (PROTONIX) 40 MG tablet Take 40 mg by mouth every evening.     . sacubitril-valsartan (ENTRESTO) 24-26 MG TAKE 1 TABLET BY MOUTH 2 (TWO) TIMES DAILY. 180 tablet 3  . Saw Palmetto, Serenoa repens, (SAW PALMETTO PO) Take 540 mg by mouth every evening.     . torsemide (DEMADEX) 10 MG tablet Take 1 tablet (10 mg total) by mouth daily. 90 tablet 6   No current facility-administered medications for this visit.    Physical Exam: Vitals:   11/07/20 1541  BP: 110/64  Pulse: 70  SpO2: 94%  Weight: 240 lb 12.8 oz (109.2 kg)  Height: 6' 1"  (1.854 m)    GEN- The patient is well appearing, alert and oriented x 3 today.   Head- normocephalic, atraumatic Eyes-  Sclera clear, conjunctiva pink Ears- hearing intact Oropharynx- clear Lungs-   normal work of breathing Heart- irregular rate and rhythm  GI- soft, NT, ND, + BS Extremities- no clubbing, cyanosis, or edema  Wt Readings from Last 3 Encounters:  11/07/20 240 lb 12.8 oz (109.2 kg)  10/18/20 238 lb 6.4 oz (108.1 kg)  08/22/20 226 lb (102.5 kg)    EKG tracing ordered today is personally reviewed and shows afib, LAD  Assessment and Plan:  1. Persistent afib The patient has symptomatic, recurrent persistent atrial fibrillation. he has failed medical therapy with tikosyn and amiodarone. Chads2vasc score is 3.  he is anticoagulated with eliquis. I sent him previously to Dr Roxy Manns who is concerned about perioperative risks of MAZE.  He has recently seen Dr Lamonte Sakai (note 10/18/20 reviewed) which reveals stable post  COVID ILR. Dr Lamonte Sakai states "He is planning to talk to Dr. Rayann Heman about possible catheter ablation.  He would likely be low risk for the procedure given his overall functional capacity" Therapeutic strategies for afib including ablation were discussed in detail with the patient today. Risk, benefits, and alternatives to EP study and radiofrequency ablation for afib were also discussed in detail today. These risks include but are not limited to stroke, bleeding, vascular damage, tamponade, perforation, damage to the esophagus, lungs, and other structures, pulmonary vein stenosis, worsening renal function, and death. The patient understands these risk and wishes to proceed.  We will therefore proceed with catheter ablation at the next available time.  Carto, ICE, anesthesia are requested for the procedure.  Will also obtain cardiac CT prior to the procedure to exclude LAA thrombus and further evaluate atrial anatomy.  Reduce amiodarone to 273m daily today.  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  Will plan to stop it post ablation.  2. Nonischemic  CM/ chronic systolic dysfunction Likely tachycardia mediated Hopefully will improve post ablation V rates are currently controlled   Risks, benefits and potential toxicities for medications prescribed and/or refilled reviewed with patient today.   [name] MD, [facility] 11/07/2020 3:55 PM

## 2020-11-07 NOTE — H&P (View-Only) (Signed)
PCP: [name], MD Primary Cardiologist: Dr Aundra Dubin Primary EP: Dr Hassell Halim is a 75 y.o. male who presents today for routine electrophysiology followup.  Since last being seen in our clinic, the patient reports doing very well.  Today, he denies symptoms of palpitations, chest pain, shortness of breath,  lower extremity edema, dizziness, presyncope, or syncope.  The patient is otherwise without complaint today.   Past Medical History:  Diagnosis Date  . Chronic combined systolic and diastolic heart failure ([place]) 12/13/2019  . Chronic kidney disease   . CKD (chronic kidney disease) stage 3, GFR 30-59 ml/min (HCC) 12/13/2019  . Diabetes mellitus ([place])   . Gastric ulcer 12/13/2019   +H. Pylori. Bleeding required clipping.  . H/O blood clots   . History of kidney cancer   . Persistent atrial fibrillation ([place]) 12/13/2019  . Renal cell carcinoma ([place]) 12/13/2019   S/p nephrectomy   Past Surgical History:  Procedure Laterality Date  . APPENDECTOMY    . ATRIAL FIBRILLATION ABLATION N/A 04/07/2020   Procedure: ATRIAL FIBRILLATION ABLATION;  Surgeon: [name], MD;  Location: [place] [facility];  Service: Cardiovascular;  Laterality: N/A;  . BIOPSY  11/19/2019   Procedure: BIOPSY;  Surgeon: Ronald Lobo, MD;  Location: [place];  Service: Endoscopy;;  . CARDIOVERSION N/A 12/17/2019   Procedure: CARDIOVERSION;  Surgeon: Geralynn Rile, MD;  Location: [place];  Service: Cardiovascular;  Laterality: N/A;  . CARDIOVERSION N/A 02/18/2020   Procedure: CARDIOVERSION;  Surgeon: Jerline Pain, MD;  Location: [facility] ENDOSCOPY;  Service: Cardiovascular;  Laterality: N/A;  . CARDIOVERSION N/A 02/29/2020   Procedure: CARDIOVERSION;  Surgeon: Larey Dresser, MD;  Location: [facility] ENDOSCOPY;  Service: Cardiovascular;  Laterality: N/A;  . CARDIOVERSION N/A 06/06/2020   Procedure: CARDIOVERSION;  Surgeon: Larey Dresser, MD;  Location: [facility] ENDOSCOPY;  Service: Cardiovascular;   Laterality: N/A;  . ESOPHAGOGASTRODUODENOSCOPY N/A 11/19/2019   Procedure: ESOPHAGOGASTRODUODENOSCOPY (EGD);  Surgeon: Ronald Lobo, MD;  Location: [facility] ENDOSCOPY;  Service: Endoscopy;  Laterality: N/A;  . HEMOSTASIS CLIP PLACEMENT  11/19/2019   Procedure: HEMOSTASIS CLIP PLACEMENT;  Surgeon: Ronald Lobo, MD;  Location: [place];  Service: Endoscopy;;  . NEPHRECTOMY    . RIGHT/LEFT HEART CATH AND CORONARY ANGIOGRAPHY N/A 07/21/2020   Procedure: RIGHT/LEFT HEART CATH AND CORONARY ANGIOGRAPHY;  Surgeon: Larey Dresser, MD;  Location: [place] [facility];  Service: Cardiovascular;  Laterality: N/A;  . TEE WITHOUT CARDIOVERSION N/A 05/18/2020   Procedure: TRANSESOPHAGEAL ECHOCARDIOGRAM (TEE);  Surgeon: Larey Dresser, MD;  Location: [facility] ENDOSCOPY;  Service: Cardiovascular;  Laterality: N/A;    ROS- all systems are reviewed and negatives except as per HPI above  Current Outpatient Medications  Medication Sig Dispense Refill  . ACCU-CHEK GUIDE test strip daily. for testing    . Accu-Chek Softclix Lancets lancets     . acetaminophen (TYLENOL) 500 MG tablet Take 1,000 mg by mouth every 6 (six) hours as needed for mild pain or headache.    Marland Kitchen amiodarone (PACERONE) 200 MG tablet TAKE 1 TABLET BY MOUTH TWICE A DAY (Patient taking differently: daily. ) 180 tablet 1  . apixaban (ELIQUIS) 5 MG TABS tablet Take 1 tablet (5 mg total) by mouth 2 (two) times daily. 180 tablet 3  . ascorbic acid (SM VITAMIN C) 1000 MG tablet Take 1,000 mg by mouth every evening.     Marland Kitchen atorvastatin (LIPITOR) 10 MG tablet Take 1 tablet (10 mg total) by mouth daily. 90 tablet 3  .  Blood Glucose Monitoring Suppl (ACCU-CHEK GUIDE ME) w/Device KIT See admin instructions.    . carvedilol (COREG) 12.5 MG tablet Take 1 tablet (12.5 mg total) by mouth in the morning and at bedtime. 180 tablet 3  . dapagliflozin propanediol (FARXIGA) 10 MG TABS tablet Take 1 tablet (10 mg total) by mouth daily before breakfast. 90 tablet 3  .  digoxin (LANOXIN) 0.125 MG tablet Take 0.5 tablets (0.0625 mg total) by mouth daily. 45 tablet 3  . Garlic 5397 MG CAPS Take 1,000 mg by mouth every evening.    . Glucosamine HCl 1000 MG TABS Take 1,000 mg by mouth every evening.    . Multiple Vitamin (MULTIVITAMIN WITH MINERALS) TABS tablet Take 1 tablet by mouth daily with breakfast.     . Omega-3 Fatty Acids (FISH OIL PO) Take 350 mg by mouth every evening.     . pantoprazole (PROTONIX) 40 MG tablet Take 40 mg by mouth every evening.     . sacubitril-valsartan (ENTRESTO) 24-26 MG TAKE 1 TABLET BY MOUTH 2 (TWO) TIMES DAILY. 180 tablet 3  . Saw Palmetto, Serenoa repens, (SAW PALMETTO PO) Take 540 mg by mouth every evening.     . torsemide (DEMADEX) 10 MG tablet Take 1 tablet (10 mg total) by mouth daily. 90 tablet 6   No current facility-administered medications for this visit.    Physical Exam: Vitals:   11/07/20 1541  BP: 110/64  Pulse: 70  SpO2: 94%  Weight: 240 lb 12.8 oz (109.2 kg)  Height: 6' 1"  (1.854 m)    GEN- The patient is well appearing, alert and oriented x 3 today.   Head- normocephalic, atraumatic Eyes-  Sclera clear, conjunctiva pink Ears- hearing intact Oropharynx- clear Lungs-   normal work of breathing Heart- irregular rate and rhythm  GI- soft, NT, ND, + BS Extremities- no clubbing, cyanosis, or edema  Wt Readings from Last 3 Encounters:  11/07/20 240 lb 12.8 oz (109.2 kg)  10/18/20 238 lb 6.4 oz (108.1 kg)  08/22/20 226 lb (102.5 kg)    EKG tracing ordered today is personally reviewed and shows afib, LAD  Assessment and Plan:  1. Persistent afib The patient has symptomatic, recurrent persistent atrial fibrillation. he has failed medical therapy with tikosyn and amiodarone. Chads2vasc score is 3.  he is anticoagulated with eliquis. I sent him previously to Dr Roxy Manns who is concerned about perioperative risks of MAZE.  He has recently seen Dr Lamonte Sakai (note 10/18/20 reviewed) which reveals stable post  COVID ILR. Dr Lamonte Sakai states "He is planning to talk to Dr. Rayann Heman about possible catheter ablation.  He would likely be low risk for the procedure given his overall functional capacity" Therapeutic strategies for afib including ablation were discussed in detail with the patient today. Risk, benefits, and alternatives to EP study and radiofrequency ablation for afib were also discussed in detail today. These risks include but are not limited to stroke, bleeding, vascular damage, tamponade, perforation, damage to the esophagus, lungs, and other structures, pulmonary vein stenosis, worsening renal function, and death. The patient understands these risk and wishes to proceed.  We will therefore proceed with catheter ablation at the next available time.  Carto, ICE, anesthesia are requested for the procedure.  Will also obtain cardiac CT prior to the procedure to exclude LAA thrombus and further evaluate atrial anatomy.  Reduce amiodarone to 246m daily today.  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  Will plan to stop it post ablation.  2. Nonischemic  CM/ chronic systolic dysfunction Likely tachycardia mediated Hopefully will improve post ablation V rates are currently controlled   Risks, benefits and potential toxicities for medications prescribed and/or refilled reviewed with patient today.   [name] MD, [facility] 11/07/2020 3:55 PM

## 2020-11-07 NOTE — Progress Notes (Signed)
PCP: [name], MD Primary Cardiologist: Dr Aundra Dubin Primary EP: Dr Hassell Halim is a 75 y.o. male who presents today for routine electrophysiology followup.  Since last being seen in our clinic, the patient reports doing very well.  Today, he denies symptoms of palpitations, chest pain, shortness of breath,  lower extremity edema, dizziness, presyncope, or syncope.  The patient is otherwise without complaint today.   Past Medical History:  Diagnosis Date  . Chronic combined systolic and diastolic heart failure ([place]) 12/13/2019  . Chronic kidney disease   . CKD (chronic kidney disease) stage 3, GFR 30-59 ml/min (HCC) 12/13/2019  . Diabetes mellitus ([place])   . Gastric ulcer 12/13/2019   +H. Pylori. Bleeding required clipping.  . H/O blood clots   . History of kidney cancer   . Persistent atrial fibrillation ([place]) 12/13/2019  . Renal cell carcinoma ([place]) 12/13/2019   S/p nephrectomy   Past Surgical History:  Procedure Laterality Date  . APPENDECTOMY    . ATRIAL FIBRILLATION ABLATION N/A 04/07/2020   Procedure: ATRIAL FIBRILLATION ABLATION;  Surgeon: [name], MD;  Location: [place] [facility];  Service: Cardiovascular;  Laterality: N/A;  . BIOPSY  11/19/2019   Procedure: BIOPSY;  Surgeon: Ronald Lobo, MD;  Location: [place];  Service: Endoscopy;;  . CARDIOVERSION N/A 12/17/2019   Procedure: CARDIOVERSION;  Surgeon: Geralynn Rile, MD;  Location: [place];  Service: Cardiovascular;  Laterality: N/A;  . CARDIOVERSION N/A 02/18/2020   Procedure: CARDIOVERSION;  Surgeon: Jerline Pain, MD;  Location: [facility] ENDOSCOPY;  Service: Cardiovascular;  Laterality: N/A;  . CARDIOVERSION N/A 02/29/2020   Procedure: CARDIOVERSION;  Surgeon: Larey Dresser, MD;  Location: [facility] ENDOSCOPY;  Service: Cardiovascular;  Laterality: N/A;  . CARDIOVERSION N/A 06/06/2020   Procedure: CARDIOVERSION;  Surgeon: Larey Dresser, MD;  Location: [facility] ENDOSCOPY;  Service: Cardiovascular;   Laterality: N/A;  . ESOPHAGOGASTRODUODENOSCOPY N/A 11/19/2019   Procedure: ESOPHAGOGASTRODUODENOSCOPY (EGD);  Surgeon: Ronald Lobo, MD;  Location: [facility] ENDOSCOPY;  Service: Endoscopy;  Laterality: N/A;  . HEMOSTASIS CLIP PLACEMENT  11/19/2019   Procedure: HEMOSTASIS CLIP PLACEMENT;  Surgeon: Ronald Lobo, MD;  Location: [place];  Service: Endoscopy;;  . NEPHRECTOMY    . RIGHT/LEFT HEART CATH AND CORONARY ANGIOGRAPHY N/A 07/21/2020   Procedure: RIGHT/LEFT HEART CATH AND CORONARY ANGIOGRAPHY;  Surgeon: Larey Dresser, MD;  Location: [place] [facility];  Service: Cardiovascular;  Laterality: N/A;  . TEE WITHOUT CARDIOVERSION N/A 05/18/2020   Procedure: TRANSESOPHAGEAL ECHOCARDIOGRAM (TEE);  Surgeon: Larey Dresser, MD;  Location: [facility] ENDOSCOPY;  Service: Cardiovascular;  Laterality: N/A;    ROS- all systems are reviewed and negatives except as per HPI above  Current Outpatient Medications  Medication Sig Dispense Refill  . ACCU-CHEK GUIDE test strip daily. for testing    . Accu-Chek Softclix Lancets lancets     . acetaminophen (TYLENOL) 500 MG tablet Take 1,000 mg by mouth every 6 (six) hours as needed for mild pain or headache.    Marland Kitchen amiodarone (PACERONE) 200 MG tablet TAKE 1 TABLET BY MOUTH TWICE A DAY (Patient taking differently: daily. ) 180 tablet 1  . apixaban (ELIQUIS) 5 MG TABS tablet Take 1 tablet (5 mg total) by mouth 2 (two) times daily. 180 tablet 3  . ascorbic acid (SM VITAMIN C) 1000 MG tablet Take 1,000 mg by mouth every evening.     Marland Kitchen atorvastatin (LIPITOR) 10 MG tablet Take 1 tablet (10 mg total) by mouth daily. 90 tablet 3  .  Blood Glucose Monitoring Suppl (ACCU-CHEK GUIDE ME) w/Device KIT See admin instructions.    . carvedilol (COREG) 12.5 MG tablet Take 1 tablet (12.5 mg total) by mouth in the morning and at bedtime. 180 tablet 3  . dapagliflozin propanediol (FARXIGA) 10 MG TABS tablet Take 1 tablet (10 mg total) by mouth daily before breakfast. 90 tablet 3  .  digoxin (LANOXIN) 0.125 MG tablet Take 0.5 tablets (0.0625 mg total) by mouth daily. 45 tablet 3  . Garlic 0938 MG CAPS Take 1,000 mg by mouth every evening.    . Glucosamine HCl 1000 MG TABS Take 1,000 mg by mouth every evening.    . Multiple Vitamin (MULTIVITAMIN WITH MINERALS) TABS tablet Take 1 tablet by mouth daily with breakfast.     . Omega-3 Fatty Acids (FISH OIL PO) Take 350 mg by mouth every evening.     . pantoprazole (PROTONIX) 40 MG tablet Take 40 mg by mouth every evening.     . sacubitril-valsartan (ENTRESTO) 24-26 MG TAKE 1 TABLET BY MOUTH 2 (TWO) TIMES DAILY. 180 tablet 3  . Saw Palmetto, Serenoa repens, (SAW PALMETTO PO) Take 540 mg by mouth every evening.     . torsemide (DEMADEX) 10 MG tablet Take 1 tablet (10 mg total) by mouth daily. 90 tablet 6   No current facility-administered medications for this visit.    Physical Exam: Vitals:   11/07/20 1541  BP: 110/64  Pulse: 70  SpO2: 94%  Weight: 240 lb 12.8 oz (109.2 kg)  Height: 6' 1"  (1.854 m)    GEN- The patient is well appearing, alert and oriented x 3 today.   Head- normocephalic, atraumatic Eyes-  Sclera clear, conjunctiva pink Ears- hearing intact Oropharynx- clear Lungs-   normal work of breathing Heart- irregular rate and rhythm  GI- soft, NT, ND, + BS Extremities- no clubbing, cyanosis, or edema  Wt Readings from Last 3 Encounters:  11/07/20 240 lb 12.8 oz (109.2 kg)  10/18/20 238 lb 6.4 oz (108.1 kg)  08/22/20 226 lb (102.5 kg)    EKG tracing ordered today is personally reviewed and shows afib, LAD  Assessment and Plan:  1. Persistent afib The patient has symptomatic, recurrent persistent atrial fibrillation. he has failed medical therapy with tikosyn and amiodarone. Chads2vasc score is 3.  he is anticoagulated with eliquis. I sent him previously to Dr Roxy Manns who is concerned about perioperative risks of MAZE.  He has recently seen Dr Lamonte Sakai (note 10/18/20 reviewed) which reveals stable post  COVID ILR. Dr Lamonte Sakai states "He is planning to talk to Dr. Rayann Heman about possible catheter ablation.  He would likely be low risk for the procedure given his overall functional capacity" Therapeutic strategies for afib including ablation were discussed in detail with the patient today. Risk, benefits, and alternatives to EP study and radiofrequency ablation for afib were also discussed in detail today. These risks include but are not limited to stroke, bleeding, vascular damage, tamponade, perforation, damage to the esophagus, lungs, and other structures, pulmonary vein stenosis, worsening renal function, and death. The patient understands these risk and wishes to proceed.  We will therefore proceed with catheter ablation at the next available time.  Carto, ICE, anesthesia are requested for the procedure.  Will also obtain cardiac CT prior to the procedure to exclude LAA thrombus and further evaluate atrial anatomy.  Reduce amiodarone to 245m daily today.  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  Will plan to stop it post ablation.  2. Nonischemic  CM/ chronic systolic dysfunction Likely tachycardia mediated Hopefully will improve post ablation V rates are currently controlled   Risks, benefits and potential toxicities for medications prescribed and/or refilled reviewed with patient today.   [name] MD, [facility] 11/07/2020 3:55 PM

## 2020-11-07 NOTE — Patient Instructions (Addendum)
Medication Instructions:  Your physician has recommended you make the following change in your medication:  1) Decrease Amiodarone to 200 mg daily  *If you need a refill on your cardiac medications before your next appointment, please call your pharmacy*   Lab Work: Your physician recommends that you return for lab work on 2/9--see letter  If you have labs (blood work) drawn today and your tests are completely normal, you will receive your results only by: Marland Kitchen MyChart Message (if you have MyChart) OR . A paper copy in the mail If you have any lab test that is abnormal or we need to change your treatment, we will call you to review the results.   Testing/Procedures: Your physician has requested that you have cardiac CT. Cardiac computed tomography (CT) is a painless test that uses an x-ray machine to take clear, detailed pictures of your heart. For further information please visit [email or web address]. Please follow instruction sheet as given.   Cardiac Ablation Cardiac ablation is a procedure to destroy (ablate) some heart tissue that is sending bad signals. These bad signals cause problems in heart rhythm. The heart has many areas that make these signals. If there are problems in these areas, they can make the heart beat in a way that is not normal. Destroying some tissues can help make the heart rhythm normal. Tell your doctor about:  Any allergies you have.  All medicines you are taking. These include vitamins, herbs, eye drops, creams, and over-the-counter medicines.  Any problems you or family members have had with medicines that make you fall asleep (anesthetics).  Any blood disorders you have.  Any surgeries you have had.  Any medical conditions you have, such as kidney failure.  Whether you are pregnant or may be pregnant. What are the risks? This is a safe procedure. But problems may occur, including:  Infection.  Bruising and bleeding.  Bleeding into the  chest.  Stroke or blood clots.  Damage to nearby areas of your body.  Allergies to medicines or dyes.  The need for a pacemaker if the normal system is damaged.  Failure of the procedure to treat the problem. What happens before the procedure? Medicines Ask your doctor about:  Changing or stopping your normal medicines. This is important.  Taking aspirin and ibuprofen. Do not take these medicines unless your doctor tells you to take them.  Taking other medicines, vitamins, herbs, and supplements. General instructions  Follow instructions from your doctor about what you cannot eat or drink.  Plan to have someone take you home from the hospital or clinic.  If you will be going home right after the procedure, plan to have someone with you for 24 hours.  Ask your doctor what steps will be taken to prevent infection. What happens during the procedure?  An IV tube will be put into one of your veins.  You will be given a medicine to help you relax.  The skin on your neck or groin will be numbed.  A cut (incision) will be made in your neck or groin. A needle will be put through your cut and into a large vein.  A tube (catheter) will be put into the needle. The tube will be moved to your heart.  Dye may be put through the tube. This helps your doctor see your heart.  Small devices (electrodes) on the tube will send out signals.  A type of energy will be used to destroy some heart tissue.  The tube will  be taken out.  Pressure will be held on your cut. This helps stop bleeding.  A bandage will be put over your cut. The exact procedure may vary among doctors and hospitals.   What happens after the procedure?  You will be watched until you leave the hospital or clinic. This includes checking your heart rate, breathing rate, oxygen, and blood pressure.  Your cut will be watched for bleeding. You will need to lie still for a few hours.  Do not drive for 24 hours or as  long as your doctor tells you. Summary  Cardiac ablation is a procedure to destroy some heart tissue. This is done to treat heart rhythm problems.  Tell your doctor about any medical conditions you may have. Tell him or her about all medicines you are taking to treat them.  This is a safe procedure. But problems may occur. These include infection, bruising, bleeding, and damage to nearby areas of your body.  Follow what your doctor tells you about food and drink. You may also be told to change or stop some of your medicines.  After the procedure, do not drive for 24 hours or as long as your doctor tells you. This information is not intended to replace advice given to you by your health care provider. Make sure you discuss any questions you have with your health care provider. Document Revised: 08/27/2019 Document Reviewed: 08/27/2019 Elsevier Patient Education  [place].          Follow up with Adline Peals PA at the [place] clinic on 3/29 and with Dr. Rayann Heman on 6/8

## 2020-11-09 ENCOUNTER — Encounter (HOSPITAL_COMMUNITY): Payer: Commercial Managed Care - PPO

## 2020-11-16 ENCOUNTER — Other Ambulatory Visit: Payer: Self-pay

## 2020-11-16 ENCOUNTER — Other Ambulatory Visit: Payer: Commercial Managed Care - PPO

## 2020-11-16 DIAGNOSIS — I4891 Unspecified atrial fibrillation: Secondary | ICD-10-CM

## 2020-11-16 DIAGNOSIS — D6869 Other thrombophilia: Secondary | ICD-10-CM

## 2020-11-16 DIAGNOSIS — I4819 Other persistent atrial fibrillation: Secondary | ICD-10-CM

## 2020-11-16 DIAGNOSIS — I428 Other cardiomyopathies: Secondary | ICD-10-CM

## 2020-11-17 LAB — CBC WITH DIFFERENTIAL/PLATELET
Basophils Absolute: 0.1 10*3/uL (ref 0.0–0.2)
Basos: 1 %
EOS (ABSOLUTE): 0.1 10*3/uL (ref 0.0–0.4)
Eos: 2 %
Hematocrit: 44.6 % (ref 37.5–51.0)
Hemoglobin: 14.7 g/dL (ref 13.0–17.7)
Immature Grans (Abs): 0 10*3/uL (ref 0.0–0.1)
Immature Granulocytes: 0 %
Lymphocytes Absolute: 1.9 10*3/uL (ref 0.7–3.1)
Lymphs: 25 %
MCH: 32.4 pg (ref 26.6–33.0)
MCHC: 33 g/dL (ref 31.5–35.7)
MCV: 98 fL — ABNORMAL HIGH (ref 79–97)
Monocytes Absolute: 0.6 10*3/uL (ref 0.1–0.9)
Monocytes: 8 %
Neutrophils Absolute: 4.8 10*3/uL (ref 1.4–7.0)
Neutrophils: 64 %
Platelets: 183 10*3/uL (ref 150–450)
RBC: 4.54 x10E6/uL (ref 4.14–5.80)
RDW: 13.9 % (ref 11.6–15.4)
WBC: 7.4 10*3/uL (ref 3.4–10.8)

## 2020-11-17 LAB — BASIC METABOLIC PANEL
BUN/Creatinine Ratio: 14 (ref 10–24)
BUN: 33 mg/dL — ABNORMAL HIGH (ref 8–27)
CO2: 21 mmol/L (ref 20–29)
Calcium: 8.9 mg/dL (ref 8.6–10.2)
Chloride: 105 mmol/L (ref 96–106)
Creatinine, Ser: 2.3 mg/dL — ABNORMAL HIGH (ref 0.76–1.27)
GFR calc Af Amer: 32 mL/min/{1.73_m2} — ABNORMAL LOW (ref >59)
GFR calc non Af Amer: 27 mL/min/{1.73_m2} — ABNORMAL LOW (ref >59)
Glucose: 95 mg/dL (ref 65–99)
Potassium: 5.1 mmol/L (ref 3.5–5.2)
Sodium: 143 mmol/L (ref 134–144)

## 2020-11-25 ENCOUNTER — Telehealth: Payer: Self-pay | Admitting: Internal Medicine

## 2020-11-25 NOTE — Telephone Encounter (Signed)
Patient's wife would like to know why the patient's CT has been cancelled and what other type of testing is needed prior to 12/06/20 afib ablation.

## 2020-11-25 NOTE — Telephone Encounter (Signed)
Advised her this was because of his kidney function and that we would need a TEE the day before. I will call today and see what times they have available and give her a call back with an update next week after everything is set up.  Malachy Mood verbalized understanding.

## 2020-11-29 ENCOUNTER — Ambulatory Visit (HOSPITAL_COMMUNITY): Admission: RE | Admit: 2020-11-29 | Payer: Commercial Managed Care - PPO | Source: Ambulatory Visit

## 2020-11-29 ENCOUNTER — Telehealth: Payer: Self-pay | Admitting: *Deleted

## 2020-11-29 ENCOUNTER — Ambulatory Visit (HOSPITAL_COMMUNITY): Payer: Commercial Managed Care - PPO

## 2020-11-29 ENCOUNTER — Encounter: Payer: Self-pay | Admitting: *Deleted

## 2020-11-29 NOTE — Telephone Encounter (Signed)
Called and dicussed TEE instructions with patients wife. IF she/he has further questions after viewing over mychart to call or mychart me with questions.  Verbalized understanding.

## 2020-12-03 ENCOUNTER — Other Ambulatory Visit (HOSPITAL_COMMUNITY)
Admission: RE | Admit: 2020-12-03 | Discharge: 2020-12-03 | Disposition: A | Discharge status: Home / Self Care | Payer: Commercial Managed Care - PPO | Source: Ambulatory Visit | Attending: Internal Medicine | Admitting: Internal Medicine

## 2020-12-03 DIAGNOSIS — Z20822 Contact with and (suspected) exposure to covid-19: Secondary | ICD-10-CM | POA: Insufficient documentation

## 2020-12-03 DIAGNOSIS — Z01812 Encounter for preprocedural laboratory examination: Secondary | ICD-10-CM | POA: Diagnosis not present

## 2020-12-03 LAB — SARS CORONAVIRUS 2 (TAT 6-24 HRS): SARS Coronavirus 2: NEGATIVE

## 2020-12-05 ENCOUNTER — Ambulatory Visit (HOSPITAL_COMMUNITY)
Admission: RE | Admit: 2020-12-05 | Discharge: 2020-12-05 | Disposition: A | Discharge status: Home / Self Care | Payer: Commercial Managed Care - PPO | Attending: Cardiovascular Disease | Admitting: Cardiovascular Disease

## 2020-12-05 ENCOUNTER — Ambulatory Visit (HOSPITAL_COMMUNITY): Payer: Commercial Managed Care - PPO | Admitting: Anesthesiology

## 2020-12-05 ENCOUNTER — Ambulatory Visit (HOSPITAL_BASED_OUTPATIENT_CLINIC_OR_DEPARTMENT_OTHER): Payer: Commercial Managed Care - PPO

## 2020-12-05 ENCOUNTER — Encounter (HOSPITAL_COMMUNITY)
Admission: RE | Disposition: A | Discharge status: Home / Self Care | Payer: Self-pay | Source: Home / Self Care | Attending: Cardiovascular Disease

## 2020-12-05 ENCOUNTER — Encounter (HOSPITAL_COMMUNITY): Payer: Self-pay | Admitting: Cardiovascular Disease

## 2020-12-05 ENCOUNTER — Other Ambulatory Visit: Payer: Self-pay

## 2020-12-05 DIAGNOSIS — Z79899 Other long term (current) drug therapy: Secondary | ICD-10-CM | POA: Diagnosis not present

## 2020-12-05 DIAGNOSIS — I4891 Unspecified atrial fibrillation: Secondary | ICD-10-CM

## 2020-12-05 DIAGNOSIS — Z7901 Long term (current) use of anticoagulants: Secondary | ICD-10-CM | POA: Diagnosis not present

## 2020-12-05 DIAGNOSIS — Z7984 Long term (current) use of oral hypoglycemic drugs: Secondary | ICD-10-CM | POA: Insufficient documentation

## 2020-12-05 DIAGNOSIS — I4819 Other persistent atrial fibrillation: Secondary | ICD-10-CM | POA: Insufficient documentation

## 2020-12-05 DIAGNOSIS — I340 Nonrheumatic mitral (valve) insufficiency: Secondary | ICD-10-CM

## 2020-12-05 DIAGNOSIS — I088 Other rheumatic multiple valve diseases: Secondary | ICD-10-CM | POA: Diagnosis not present

## 2020-12-05 DIAGNOSIS — C649 Malignant neoplasm of unspecified kidney, except renal pelvis: Secondary | ICD-10-CM | POA: Diagnosis not present

## 2020-12-05 DIAGNOSIS — I428 Other cardiomyopathies: Secondary | ICD-10-CM | POA: Insufficient documentation

## 2020-12-05 DIAGNOSIS — J449 Chronic obstructive pulmonary disease, unspecified: Secondary | ICD-10-CM | POA: Diagnosis not present

## 2020-12-05 HISTORY — PX: TEE WITHOUT CARDIOVERSION: SHX5443

## 2020-12-05 LAB — GLUCOSE, CAPILLARY: Glucose-Capillary: 113 mg/dL — ABNORMAL HIGH (ref 70–99)

## 2020-12-05 SURGERY — ECHOCARDIOGRAM, TRANSESOPHAGEAL
Anesthesia: Monitor Anesthesia Care

## 2020-12-05 MED ORDER — SODIUM CHLORIDE 0.9 % IV SOLN: INTRAVENOUS | Status: DC

## 2020-12-05 MED ORDER — PHENYLEPHRINE 40 MCG/ML (10ML) SYRINGE FOR IV PUSH (FOR BLOOD PRESSURE SUPPORT)
PREFILLED_SYRINGE | INTRAVENOUS | Status: DC | PRN
  Administered 2020-12-05: 80 ug via INTRAVENOUS

## 2020-12-05 MED ORDER — PROPOFOL 500 MG/50ML IV EMUL
INTRAVENOUS | Status: DC | PRN
  Administered 2020-12-05: 100 ug/kg/min via INTRAVENOUS

## 2020-12-05 MED ORDER — PROPOFOL 10 MG/ML IV BOLUS
INTRAVENOUS | Status: DC | PRN
Start: 2020-12-05 — End: 2020-12-05
  Administered 2020-12-05: 15 mg via INTRAVENOUS
  Administered 2020-12-05: 10 mg via INTRAVENOUS

## 2020-12-05 NOTE — Anesthesia Preprocedure Evaluation (Addendum)
Anesthesia Evaluation  Patient identified by MRN, date of birth, ID band Patient awake    Reviewed: Allergy & Precautions, NPO status , Patient's Chart, lab work & pertinent test results  Airway Mallampati: II  TM Distance: >3 FB     Dental   Pulmonary pneumonia, COPD, former smoker,    breath sounds clear to auscultation       Cardiovascular negative cardio ROS   Rhythm:Regular Rate:Normal     Neuro/Psych  Neuromuscular disease    GI/Hepatic PUD,   Endo/Other  diabetes  Renal/GU Renal disease     Musculoskeletal   Abdominal   Peds  Hematology   Anesthesia Other Findings   Reproductive/Obstetrics                            Anesthesia Physical Anesthesia Plan  ASA: III  Anesthesia Plan: General   Post-op Pain Management:    Induction: Intravenous  PONV Risk Score and Plan: 3 and Ondansetron and Dexamethasone  Airway Management Planned:   Additional Equipment:   Intra-op Plan:   Post-operative Plan: Extubation in OR  Informed Consent:     Dental advisory given  Plan Discussed with: Anesthesiologist and CRNA  Anesthesia Plan Comments:        Anesthesia Quick Evaluation

## 2020-12-05 NOTE — Interval H&P Note (Signed)
History and Physical Interval Note:  12/05/2020 8:17 AM  Melvin Ramirez  has presented today for surgery, with the diagnosis of AFIB.  The various methods of treatment have been discussed with the patient and family. After consideration of risks, benefits and other options for treatment, the patient has consented to  Procedure(s): TRANSESOPHAGEAL ECHOCARDIOGRAM (TEE) (N/A) as a surgical intervention.  The patient's history has been reviewed, patient examined, no change in status, stable for surgery.  I have reviewed the patient's chart and labs.  Questions were answered to the patient's satisfaction.     Dorris Carnes

## 2020-12-05 NOTE — Anesthesia Postprocedure Evaluation (Signed)
Anesthesia Post Note  Patient: Melvin Ramirez  Procedure(s) Performed: TRANSESOPHAGEAL ECHOCARDIOGRAM (TEE) (N/A )     Patient location during evaluation: PACU Anesthesia Type: MAC Level of consciousness: awake and alert Pain management: pain level controlled Vital Signs Assessment: post-procedure vital signs reviewed and stable Respiratory status: spontaneous breathing, nonlabored ventilation, respiratory function stable and patient connected to nasal cannula oxygen Cardiovascular status: stable and blood pressure returned to baseline Postop Assessment: no apparent nausea or vomiting Anesthetic complications: no   No complications documented.  Last Vitals:  Vitals:   12/05/20 0857 12/05/20 0904  BP: 102/65 107/63  Pulse: (!) 43 (!) 58  Resp: 20 17  Temp:    SpO2: 95% 95%    Last Pain:  Vitals:   12/05/20 0904  TempSrc:   PainSc: 0-No pain                 Tiajuana Amass

## 2020-12-05 NOTE — Progress Notes (Signed)
Instructed patient on the following items: Arrival time 0530 Nothing to eat or drink after midnight No meds AM of procedure Responsible person to drive you home and stay with you for 24 hrs  Have you missed any doses of anti-coagulant Eliquis- hasn't missed any doses    

## 2020-12-05 NOTE — Anesthesia Procedure Notes (Signed)
Procedure Name: MAC Date/Time: 12/05/2020 8:35 AM Performed by: Rande Brunt, CRNA Pre-anesthesia Checklist: Patient identified, Emergency Drugs available, Suction available, Patient being monitored and Timeout performed Patient Re-evaluated:Patient Re-evaluated prior to induction Oxygen Delivery Method: Nasal cannula Preoxygenation: Pre-oxygenation with 100% oxygen Induction Type: IV induction Dental Injury: Teeth and Oropharynx as per pre-operative assessment

## 2020-12-05 NOTE — CV Procedure (Signed)
TEE  Patient anesthetized by anesthesia with Propofol intravenouslly Mouth guard placed in mouth TEE probe advanced to mid esophagus without difficulty  LA, LAA without masses Spontaneous contrast seen in LA MV normal  Mild MR TV normal  MIld TR PV normal  Mild PI AV normal  NO AI No PFO by color doppler  LVEF is depressed at approximately 30 to 35% with global hypookinesis RVEF is depressed  Mild fixed plaquing of the thoracic aorta.    Procedure was without complication  FUll report to follow.  Dorris Carnes MD

## 2020-12-05 NOTE — Progress Notes (Signed)
  Echocardiogram Echocardiogram Transesophageal has been performed.  Melvin Ramirez 12/05/2020, 8:53 AM

## 2020-12-05 NOTE — Anesthesia Preprocedure Evaluation (Addendum)
Anesthesia Evaluation  Patient identified by MRN, date of birth, ID band Patient awake    Reviewed: Allergy & Precautions, NPO status , Patient's Chart, lab work & pertinent test results  Airway Mallampati: II  TM Distance: >3 FB Neck ROM: Full    Dental  (+) Dental Advisory Given   Pulmonary COPD, former smoker,    breath sounds clear to auscultation       Cardiovascular hypertension, Pt. on home beta blockers +CHF  + dysrhythmias Atrial Fibrillation  Rhythm:Regular Rate:Normal     Neuro/Psych  Neuromuscular disease    GI/Hepatic Neg liver ROS, PUD,   Endo/Other  diabetes, Type 2  Renal/GU CRFRenal disease     Musculoskeletal   Abdominal   Peds  Hematology negative hematology ROS (+)   Anesthesia Other Findings   Reproductive/Obstetrics                             Anesthesia Physical Anesthesia Plan  ASA: III  Anesthesia Plan: MAC   Post-op Pain Management:    Induction:   PONV Risk Score and Plan: 1 and Propofol infusion, Ondansetron and Treatment may vary due to age or medical condition  Airway Management Planned: Natural Airway and Nasal Cannula  Additional Equipment:   Intra-op Plan:   Post-operative Plan:   Informed Consent: I have reviewed the patients History and Physical, chart, labs and discussed the procedure including the risks, benefits and alternatives for the proposed anesthesia with the patient or authorized representative who has indicated his/her understanding and acceptance.       Plan Discussed with: CRNA  Anesthesia Plan Comments:         Anesthesia Quick Evaluation

## 2020-12-05 NOTE — Transfer of Care (Signed)
Immediate Anesthesia Transfer of Care Note  Patient: Melvin Ramirez  Procedure(s) Performed: TRANSESOPHAGEAL ECHOCARDIOGRAM (TEE) (N/A )  Patient Location: Endoscopy Unit  Anesthesia Type:MAC  Level of Consciousness: awake, drowsy and patient cooperative  Airway & Oxygen Therapy: Patient Spontanous Breathing  Post-op Assessment: Report given to RN, Post -op Vital signs reviewed and stable and Patient moving all extremities  Post vital signs: Reviewed and stable  Last Vitals:  Vitals Value Taken Time  BP 137/101 12/05/20 0847  Temp 36.6 C 12/05/20 0845  Pulse 54 12/05/20 0848  Resp 18 12/05/20 0848  SpO2 94 % 12/05/20 0848  Vitals shown include unvalidated device data.  Last Pain:  Vitals:   12/05/20 0845  TempSrc: Oral  PainSc:          Complications: No complications documented.

## 2020-12-06 ENCOUNTER — Other Ambulatory Visit: Payer: Self-pay

## 2020-12-06 ENCOUNTER — Ambulatory Visit (HOSPITAL_COMMUNITY): Payer: Commercial Managed Care - PPO | Admitting: Anesthesiology

## 2020-12-06 ENCOUNTER — Ambulatory Visit (HOSPITAL_COMMUNITY)
Admission: RE | Admit: 2020-12-06 | Discharge: 2020-12-06 | Disposition: A | Discharge status: Home / Self Care | Payer: Commercial Managed Care - PPO | Attending: Internal Medicine | Admitting: Internal Medicine

## 2020-12-06 ENCOUNTER — Encounter (HOSPITAL_COMMUNITY): Payer: Self-pay | Admitting: Internal Medicine

## 2020-12-06 ENCOUNTER — Ambulatory Visit (HOSPITAL_COMMUNITY)
Admission: RE | Disposition: A | Discharge status: Home / Self Care | Payer: Self-pay | Source: Home / Self Care | Attending: Internal Medicine

## 2020-12-06 DIAGNOSIS — Z7901 Long term (current) use of anticoagulants: Secondary | ICD-10-CM | POA: Diagnosis not present

## 2020-12-06 DIAGNOSIS — I428 Other cardiomyopathies: Secondary | ICD-10-CM | POA: Diagnosis not present

## 2020-12-06 DIAGNOSIS — I4819 Other persistent atrial fibrillation: Secondary | ICD-10-CM | POA: Insufficient documentation

## 2020-12-06 DIAGNOSIS — Z79899 Other long term (current) drug therapy: Secondary | ICD-10-CM | POA: Insufficient documentation

## 2020-12-06 DIAGNOSIS — Z7984 Long term (current) use of oral hypoglycemic drugs: Secondary | ICD-10-CM | POA: Diagnosis not present

## 2020-12-06 HISTORY — PX: ATRIAL FIBRILLATION ABLATION: EP1191

## 2020-12-06 LAB — POCT ACTIVATED CLOTTING TIME
Activated Clotting Time: 279 seconds
Activated Clotting Time: 303 seconds

## 2020-12-06 LAB — GLUCOSE, CAPILLARY
Glucose-Capillary: 131 mg/dL — ABNORMAL HIGH (ref 70–99)
Glucose-Capillary: 123 mg/dL — ABNORMAL HIGH (ref 70–99)

## 2020-12-06 SURGERY — ATRIAL FIBRILLATION ABLATION
Anesthesia: General

## 2020-12-06 MED ORDER — HEPARIN (PORCINE) IN NACL 1000-0.9 UT/500ML-% IV SOLN
INTRAVENOUS | Status: AC
  Filled 2020-12-06: qty 500

## 2020-12-06 MED ORDER — ONDANSETRON HCL 4 MG/2ML IJ SOLN: 4.0000 mg | Freq: Four times a day (QID) | INTRAMUSCULAR | Status: DC | PRN

## 2020-12-06 MED ORDER — SUGAMMADEX SODIUM 200 MG/2ML IV SOLN
INTRAVENOUS | Status: DC | PRN
  Administered 2020-12-06: 200 mg via INTRAVENOUS

## 2020-12-06 MED ORDER — SODIUM CHLORIDE 0.9 % IV SOLN: 250.0000 mL | INTRAVENOUS | Status: DC | PRN

## 2020-12-06 MED ORDER — SODIUM CHLORIDE 0.9% FLUSH: 3.0000 mL | INTRAVENOUS | Status: DC | PRN

## 2020-12-06 MED ORDER — HEPARIN SODIUM (PORCINE) 1000 UNIT/ML IJ SOLN
INTRAMUSCULAR | Status: AC
  Filled 2020-12-06: qty 2

## 2020-12-06 MED ORDER — DEXAMETHASONE SODIUM PHOSPHATE 10 MG/ML IJ SOLN
INTRAMUSCULAR | Status: DC | PRN
  Administered 2020-12-06: 4 mg via INTRAVENOUS

## 2020-12-06 MED ORDER — SODIUM CHLORIDE 0.9% FLUSH: 3.0000 mL | Freq: Two times a day (BID) | INTRAVENOUS | Status: DC

## 2020-12-06 MED ORDER — HEPARIN SODIUM (PORCINE) 1000 UNIT/ML IJ SOLN
INTRAMUSCULAR | Status: DC | PRN
  Administered 2020-12-06: 3000 [IU] via INTRAVENOUS

## 2020-12-06 MED ORDER — LIDOCAINE 2% (20 MG/ML) 5 ML SYRINGE
INTRAMUSCULAR | Status: DC | PRN
  Administered 2020-12-06: 80 mg via INTRAVENOUS

## 2020-12-06 MED ORDER — PROTAMINE SULFATE 10 MG/ML IV SOLN
INTRAVENOUS | Status: DC | PRN
Start: 2020-12-06 — End: 2020-12-06
  Administered 2020-12-06: 40 mg via INTRAVENOUS

## 2020-12-06 MED ORDER — PROPOFOL 10 MG/ML IV BOLUS
INTRAVENOUS | Status: DC | PRN
  Administered 2020-12-06: 150 mg via INTRAVENOUS

## 2020-12-06 MED ORDER — ONDANSETRON HCL 4 MG/2ML IJ SOLN
INTRAMUSCULAR | Status: DC | PRN
  Administered 2020-12-06: 4 mg via INTRAVENOUS

## 2020-12-06 MED ORDER — ROCURONIUM BROMIDE 10 MG/ML (PF) SYRINGE
PREFILLED_SYRINGE | INTRAVENOUS | Status: DC | PRN
  Administered 2020-12-06: 20 mg via INTRAVENOUS
  Administered 2020-12-06: 50 mg via INTRAVENOUS

## 2020-12-06 MED ORDER — PHENYLEPHRINE HCL (PRESSORS) 10 MG/ML IV SOLN
INTRAVENOUS | Status: DC | PRN
  Administered 2020-12-06: 80 ug via INTRAVENOUS
  Administered 2020-12-06: 160 ug via INTRAVENOUS

## 2020-12-06 MED ORDER — HEPARIN SODIUM (PORCINE) 1000 UNIT/ML IJ SOLN
INTRAMUSCULAR | Status: DC | PRN
  Administered 2020-12-06: 1000 [IU] via INTRAVENOUS
  Administered 2020-12-06: 15000 [IU] via INTRAVENOUS

## 2020-12-06 MED ORDER — PHENYLEPHRINE HCL-NACL 10-0.9 MG/250ML-% IV SOLN
INTRAVENOUS | Status: DC | PRN
  Administered 2020-12-06: 25 ug/min via INTRAVENOUS

## 2020-12-06 MED ORDER — HYDROCODONE-ACETAMINOPHEN 5-325 MG PO TABS: 1.0000 | ORAL_TABLET | ORAL | Status: DC | PRN

## 2020-12-06 MED ORDER — FENTANYL CITRATE (PF) 100 MCG/2ML IJ SOLN
INTRAMUSCULAR | Status: DC | PRN
  Administered 2020-12-06 (×2): 25 ug via INTRAVENOUS
  Administered 2020-12-06: 50 ug via INTRAVENOUS

## 2020-12-06 MED ORDER — SODIUM CHLORIDE 0.9 % IV SOLN: INTRAVENOUS | Status: DC

## 2020-12-06 MED ORDER — ACETAMINOPHEN 325 MG PO TABS: 650.0000 mg | ORAL_TABLET | ORAL | Status: DC | PRN

## 2020-12-06 SURGICAL SUPPLY — 19 items
BLANKET WARM UNDERBOD FULL ACC (MISCELLANEOUS) ×2 IMPLANT
CATH MAPPNG PENTARAY F 2-6-2MM (CATHETERS) IMPLANT
CATH SMTCH THERMOCOOL SF DF (CATHETERS) ×1 IMPLANT
CATH SOUNDSTAR ECO 8FR (CATHETERS) ×1 IMPLANT
CATH WEBSTER BI DIR CS D-F CRV (CATHETERS) ×1 IMPLANT
CLOSURE PERCLOSE PROSTYLE (VASCULAR PRODUCTS) ×3 IMPLANT
COVER SWIFTLINK CONNECTOR (BAG) ×2 IMPLANT
MAT PREVALON FULL STRYKER (MISCELLANEOUS) ×1 IMPLANT
NDL BAYLIS TRANSSEPTAL 71CM (NEEDLE) IMPLANT
NEEDLE BAYLIS TRANSSEPTAL 71CM (NEEDLE) ×2 IMPLANT
PACK EP LATEX FREE (CUSTOM PROCEDURE TRAY) ×2
PACK EP LF (CUSTOM PROCEDURE TRAY) ×1 IMPLANT
PAD PRO RADIOLUCENT 2001M-C (PAD) ×2 IMPLANT
PENTARAY F 2-6-2MM (CATHETERS) ×2
SHEATH PINNACLE 7F 10CM (SHEATH) ×2 IMPLANT
SHEATH PINNACLE 9F 10CM (SHEATH) ×1 IMPLANT
SHEATH PROBE COVER 6X72 (BAG) ×1 IMPLANT
SHEATH SWARTZ TS SL2 63CM 8.5F (SHEATH) ×1 IMPLANT
TUBING SMART ABLATE COOLFLOW (TUBING) ×1 IMPLANT

## 2020-12-06 NOTE — Interval H&P Note (Signed)
History and Physical Interval Note:  12/06/2020 7:15 AM  Melvin Ramirez  has presented today for surgery, with the diagnosis of afib.  The various methods of treatment have been discussed with the patient and family. After consideration of risks, benefits and other options for treatment, the patient has consented to  Procedure(s): ATRIAL FIBRILLATION ABLATION (N/A) as a surgical intervention.  The patient's history has been reviewed, patient examined, no change in status, stable for surgery.  I have reviewed the patient's chart and labs.  Questions were answered to the patient's satisfaction.    TEE reviewed He reports compliance with eliquis without interruption.  [name]

## 2020-12-06 NOTE — Anesthesia Postprocedure Evaluation (Signed)
Anesthesia Post Note  Patient: Melvin Ramirez  Procedure(s) Performed: ATRIAL FIBRILLATION ABLATION (N/A )     Patient location during evaluation: PACU Anesthesia Type: General Level of consciousness: awake Pain management: pain level controlled Vital Signs Assessment: post-procedure vital signs reviewed and stable Respiratory status: spontaneous breathing Cardiovascular status: stable Postop Assessment: no apparent nausea or vomiting Anesthetic complications: no   No complications documented.  Last Vitals:  Vitals:   12/06/20 1145 12/06/20 1200  BP: 111/65 120/73  Pulse: (!) 56 (!) 55  Resp: 15 (!) 21  Temp:    SpO2: 99% 99%    Last Pain:  Vitals:   12/06/20 1110  TempSrc:   PainSc: 0-No pain                 Manal Kreutzer

## 2020-12-06 NOTE — Transfer of Care (Signed)
Immediate Anesthesia Transfer of Care Note  Patient: Melvin Ramirez  Procedure(s) Performed: ATRIAL FIBRILLATION ABLATION (N/A )  Patient Location: Cath Lab  Anesthesia Type:General  Level of Consciousness: awake, alert  and oriented  Airway & Oxygen Therapy: Patient Spontanous Breathing and Patient connected to nasal cannula oxygen  Post-op Assessment: Report given to RN and Post -op Vital signs reviewed and stable  Post vital signs: Reviewed and stable  Last Vitals:  Vitals Value Taken Time  BP 106/58 12/06/20 1006  Temp    Pulse 50 12/06/20 1010  Resp 29 12/06/20 1010  SpO2 99 % 12/06/20 1010  Vitals shown include unvalidated device data.  Last Pain:  Vitals:   12/06/20 0618  TempSrc:   PainSc: 0-No pain      Patients Stated Pain Goal: 4 (33/83/29 1916)  Complications: No complications documented.

## 2020-12-06 NOTE — Anesthesia Procedure Notes (Signed)
Procedure Name: Intubation Date/Time: 12/06/2020 7:42 AM Performed by: Inda Coke, CRNA Pre-anesthesia Checklist: Patient identified, Emergency Drugs available, Suction available and Patient being monitored Patient Re-evaluated:Patient Re-evaluated prior to induction Oxygen Delivery Method: Circle System Utilized Preoxygenation: Pre-oxygenation with 100% oxygen Induction Type: IV induction Ventilation: Mask ventilation without difficulty Laryngoscope Size: Mac and 4 Grade View: Grade I Tube type: Oral Tube size: 7.5 mm Number of attempts: 1 Airway Equipment and Method: Stylet and Oral airway Placement Confirmation: ETT inserted through vocal cords under direct vision,  positive ETCO2 and breath sounds checked- equal and bilateral Secured at: 22 cm Tube secured with: Tape Dental Injury: Teeth and Oropharynx as per pre-operative assessment

## 2020-12-06 NOTE — Discharge Instructions (Signed)
Post procedure care instructions No driving for 4 days. No lifting over 5 lbs for 1 week. No vigorous or sexual activity for 1 week. You may return to work/your usual activities on 12/14/20. Keep procedure site clean & dry. If you notice increased pain, swelling, bleeding or pus, call/return!  You may shower after 24 hours, but no soaking in baths/hot tubs/pools for 1 week.   You have an appointment set up with the [facility].  Multiple studies have shown that being followed by a dedicated atrial fibrillation clinic in addition to the standard care you receive from your other physicians improves health. We believe that enrollment in the atrial fibrillation clinic will allow [place] to better care for you.   The phone number to the [facility] is [phone]. The clinic is staffed Monday through Friday from 8:30am to 5pm.  Parking Directions: The clinic is located in the Heart and Vascular Building connected to [facility]. 1)From [address] turn on to [address] and go to the 3rd entrance  (Heart and Vascular entrance) on the right. 2)Look to the right for Heart &Vascular Parking Garage. 3)A code for the entrance is required, for March is 1223.   4)Take the elevators to the 1st floor. Registration is in the room with the glass walls at the end of the hallway.  If you have any trouble parking or locating the clinic, please don't hesitate to call [phone].  Cardiac Ablation, Care After  This sheet gives you information about how to care for yourself after your procedure. Your health care provider may also give you more specific instructions. If you have problems or questions, contact your health care provider. What can I expect after the procedure? After the procedure, it is common to have:  Bruising around your puncture site.  Tenderness around your puncture site.  Skipped heartbeats.  Tiredness (fatigue).  Follow these instructions at  home: Puncture site care   Follow instructions from your health care provider about how to take care of your puncture site. Make sure you: ? If present, leave stitches (sutures), skin glue, or adhesive strips in place. These skin closures may need to stay in place for up to 2 weeks. If adhesive strip edges start to loosen and curl up, you may trim the loose edges. Do not remove adhesive strips completely unless your health care provider tells you to do that. ? If a square bandage is present, this may be removed in 24 hours.   Check your puncture site every day for signs of infection. Check for: ? Redness, swelling, or pain. ? Fluid or blood. If your puncture site starts to bleed, lie down on your back, apply firm pressure to the area, and contact your health care provider. ? Warmth. ? Pus or a bad smell. Driving  Do not drive for at least 4 days after your procedure or however long your health care provider recommends. (Do not resume driving if you have previously been instructed not to drive for other health reasons.)  Do not drive or use heavy machinery while taking prescription pain medicine. Activity  Avoid activities that take a lot of effort for at least 7 days after your procedure.  Do not lift anything that is heavier than 5 lb (4.5 kg) for one week.   No sexual activity for 1 week.   Return to your normal activities as told by your health care provider. Ask your health care provider what activities are safe for you.  General instructions  Take over-the-counter and prescription medicines only as told by your health care provider.  Do not use any products that contain nicotine or tobacco, such as cigarettes and e-cigarettes. If you need help quitting, ask your health care provider.  You may shower after 24 hours, but Do not take baths, swim, or use a hot tub for 1 week.   Do not drink alcohol for 24 hours after your procedure.  Keep all follow-up visits as told by your  health care provider. This is important. Contact a health care provider if:  You have redness, mild swelling, or pain around your puncture site.  You have fluid or blood coming from your puncture site that stops after applying firm pressure to the area.  Your puncture site feels warm to the touch.  You have pus or a bad smell coming from your puncture site.  You have a fever.  You have chest pain or discomfort that spreads to your neck, jaw, or arm.  You are sweating a lot.  You feel nauseous.  You have a fast or irregular heartbeat.  You have shortness of breath.  You are dizzy or light-headed and feel the need to lie down.  You have pain or numbness in the arm or leg closest to your puncture site. Get help right away if:  Your puncture site suddenly swells.  Your puncture site is bleeding and the bleeding does not stop after applying firm pressure to the area. These symptoms may represent a serious problem that is an emergency. Do not wait to see if the symptoms will go away. Get medical help right away. Call your local emergency services (911 in the [place] Do not drive yourself to the hospital. Summary  After the procedure, it is normal to have bruising and tenderness at the puncture site in your groin, neck, or forearm.  Check your puncture site every day for signs of infection.  Get help right away if your puncture site is bleeding and the bleeding does not stop after applying firm pressure to the area. This is a medical emergency. This information is not intended to replace advice given to you by your health care provider. Make sure you discuss any questions you have with your health care provider.

## 2020-12-07 MED FILL — Heparin Sod (Porcine)-NaCl IV Soln 1000 Unit/500ML-0.9%: INTRAVENOUS | Qty: 500 | Status: AC

## 2021-01-03 ENCOUNTER — Ambulatory Visit (HOSPITAL_COMMUNITY): Payer: Commercial Managed Care - PPO | Admitting: Physician Assistant

## 2021-01-04 ENCOUNTER — Encounter (HOSPITAL_COMMUNITY): Payer: Self-pay | Admitting: Physician Assistant

## 2021-01-04 ENCOUNTER — Ambulatory Visit (HOSPITAL_COMMUNITY)
Admission: RE | Admit: 2021-01-04 | Discharge: 2021-01-04 | Disposition: A | Discharge status: Home / Self Care | Payer: Commercial Managed Care - PPO | Source: Ambulatory Visit | Attending: Physician Assistant | Admitting: Physician Assistant

## 2021-01-04 ENCOUNTER — Other Ambulatory Visit: Payer: Self-pay

## 2021-01-04 VITALS — BP 92/58 | HR 58 | Ht 73.0 in | Wt 243.4 lb

## 2021-01-04 DIAGNOSIS — E119 Type 2 diabetes mellitus without complications: Secondary | ICD-10-CM | POA: Insufficient documentation

## 2021-01-04 DIAGNOSIS — N183 Chronic kidney disease, stage 3 unspecified: Secondary | ICD-10-CM | POA: Insufficient documentation

## 2021-01-04 DIAGNOSIS — Z8616 Personal history of COVID-19: Secondary | ICD-10-CM | POA: Insufficient documentation

## 2021-01-04 DIAGNOSIS — Z79899 Other long term (current) drug therapy: Secondary | ICD-10-CM | POA: Diagnosis not present

## 2021-01-04 DIAGNOSIS — Z8619 Personal history of other infectious and parasitic diseases: Secondary | ICD-10-CM | POA: Insufficient documentation

## 2021-01-04 DIAGNOSIS — E669 Obesity, unspecified: Secondary | ICD-10-CM | POA: Insufficient documentation

## 2021-01-04 DIAGNOSIS — Z823 Family history of stroke: Secondary | ICD-10-CM | POA: Diagnosis not present

## 2021-01-04 DIAGNOSIS — Z7901 Long term (current) use of anticoagulants: Secondary | ICD-10-CM | POA: Diagnosis not present

## 2021-01-04 DIAGNOSIS — I5042 Chronic combined systolic (congestive) and diastolic (congestive) heart failure: Secondary | ICD-10-CM | POA: Insufficient documentation

## 2021-01-04 DIAGNOSIS — I4819 Other persistent atrial fibrillation: Secondary | ICD-10-CM | POA: Diagnosis not present

## 2021-01-04 DIAGNOSIS — Z6832 Body mass index (BMI) 32.0-32.9, adult: Secondary | ICD-10-CM | POA: Diagnosis not present

## 2021-01-04 DIAGNOSIS — Z8711 Personal history of peptic ulcer disease: Secondary | ICD-10-CM | POA: Insufficient documentation

## 2021-01-04 DIAGNOSIS — D6869 Other thrombophilia: Secondary | ICD-10-CM | POA: Insufficient documentation

## 2021-01-04 DIAGNOSIS — Z87891 Personal history of nicotine dependence: Secondary | ICD-10-CM | POA: Diagnosis not present

## 2021-01-04 DIAGNOSIS — Z8249 Family history of ischemic heart disease and other diseases of the circulatory system: Secondary | ICD-10-CM | POA: Diagnosis not present

## 2021-01-04 DIAGNOSIS — Z7984 Long term (current) use of oral hypoglycemic drugs: Secondary | ICD-10-CM | POA: Diagnosis not present

## 2021-01-04 DIAGNOSIS — Z905 Acquired absence of kidney: Secondary | ICD-10-CM | POA: Insufficient documentation

## 2021-01-04 NOTE — Progress Notes (Signed)
Primary Care Physician: [name], MD Primary Cardiologist: Dr Oval Linsey Primary Electrophysiologist: Dr Rayann Heman Referring Physician: Dr Oval Linsey [facility]: Dr Dara Hoyer is a 75 y.o. male with a history of persistent atrial fibrillation, chronic systolic and diastolic heart failure, DM, CKD 3, RCC s/p nephrectomy, and obesity who presents for follow up in the [facility].  He was admitted 11/2019 with GI bleeding and atrial fibrillation in the setting of recent COVID pneumonia.  He had COVID pneumonia approximately 1 month prior to admission.  He subsequently developed palpitations and cough.  His heart rate was in the 160s and he was found to be in atrial fibrillation.  Thyroid function was normal.  He was started on anticoagulation and developed bleeding gastric ulcers that required clipping.  He was found to be positive for H. Pylori.  He was subsequently started on Eliquis for a CHADS2VASC score of 3.  Echo that admission revealed LVEF 25 to 30% with global hypokinesis.  Hypokinesis was worse than the anterior myocardium. He underwent successful DCCV on 12/17/19. Unfortunately, he had ERAF after DCCV. Repeat echo showed EF 30-35%. Patient is unaware of his arrhythmia.  Patient was loaded on dofetilide 02/2020 but unfortunately had early return of his afib and underwent DCCV again on 02/26/20. 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   On follow up today, patient was loaded on amiodarone with DCCV on 06/06/20. Unfortunately, he continued to have recurrence of his afib. Seen by Dr Roxy Manns for MAZE consideration but there was concern about perioperative risk. He underwent afib ablation with Dr Rayann Heman on 12/06/20. He reports that he had some paroxysms of afib initially but none in the last 3 weeks. He denies CP, swallowing pain, or groin issues.   Today, he denies  symptoms of palpitations, chest pain, shortness of breath, orthopnea, PND, dizziness, presyncope, syncope, snoring, daytime somnolence, bleeding, or neurologic sequela. The patient is tolerating medications without difficulties and is otherwise without complaint today.    Atrial Fibrillation Risk Factors:  he does not have symptoms or diagnosis of sleep apnea. he does not have a history of rheumatic fever.   he has a BMI of Body mass index is 32.11 kg/m.Marland Kitchen Filed Weights   01/04/21 1503  Weight: 110.4 kg    Family History  Problem Relation Age of Onset  . Cancer Mother   . Heart attack Father   . Heart attack Brother   . Heart disease Sister   . Stroke Paternal Grandmother      Atrial Fibrillation Management history:  Previous antiarrhythmic drugs: dofetilide, amiodarone Previous cardioversions: 12/17/19, 02/18/20, 02/26/20, 06/06/20 Previous ablations: 12/06/20 CHADS2VASC score: 3 Anticoagulation history: Eliquis    Past Medical History:  Diagnosis Date  . Chronic combined systolic and diastolic heart failure ([place]) 12/13/2019  . Chronic kidney disease   . CKD (chronic kidney disease) stage 3, GFR 30-59 ml/min (HCC) 12/13/2019  . Diabetes mellitus ([place])   . Gastric ulcer 12/13/2019   +H. Pylori. Bleeding required clipping.  . H/O blood clots   . History of kidney cancer   . Persistent atrial fibrillation ([place]) 12/13/2019  . Renal cell carcinoma ([place]) 12/13/2019   S/p nephrectomy   Past Surgical History:  Procedure Laterality Date  . APPENDECTOMY    . ATRIAL FIBRILLATION ABLATION N/A 04/07/2020   Procedure: ATRIAL FIBRILLATION ABLATION;  Surgeon: Rayann Heman,  Jeneen Rinks, MD;  Location: [place] [facility];  Service: Cardiovascular;  Laterality: N/A;  . ATRIAL FIBRILLATION ABLATION N/A 12/06/2020   Procedure: ATRIAL FIBRILLATION ABLATION;  Surgeon: [name], MD;  Location: [place] [facility];  Service: Cardiovascular;  Laterality: N/A;  . BIOPSY  11/19/2019   Procedure: BIOPSY;  Surgeon:  Ronald Lobo, MD;  Location: [place];  Service: Endoscopy;;  . CARDIOVERSION N/A 12/17/2019   Procedure: CARDIOVERSION;  Surgeon: Geralynn Rile, MD;  Location: [place];  Service: Cardiovascular;  Laterality: N/A;  . CARDIOVERSION N/A 02/18/2020   Procedure: CARDIOVERSION;  Surgeon: Jerline Pain, MD;  Location: [facility] ENDOSCOPY;  Service: Cardiovascular;  Laterality: N/A;  . CARDIOVERSION N/A 02/29/2020   Procedure: CARDIOVERSION;  Surgeon: Larey Dresser, MD;  Location: [facility] ENDOSCOPY;  Service: Cardiovascular;  Laterality: N/A;  . CARDIOVERSION N/A 06/06/2020   Procedure: CARDIOVERSION;  Surgeon: Larey Dresser, MD;  Location: [facility] ENDOSCOPY;  Service: Cardiovascular;  Laterality: N/A;  . ESOPHAGOGASTRODUODENOSCOPY N/A 11/19/2019   Procedure: ESOPHAGOGASTRODUODENOSCOPY (EGD);  Surgeon: Ronald Lobo, MD;  Location: [facility] ENDOSCOPY;  Service: Endoscopy;  Laterality: N/A;  . HEMOSTASIS CLIP PLACEMENT  11/19/2019   Procedure: HEMOSTASIS CLIP PLACEMENT;  Surgeon: Ronald Lobo, MD;  Location: [place];  Service: Endoscopy;;  . NEPHRECTOMY    . RIGHT/LEFT HEART CATH AND CORONARY ANGIOGRAPHY N/A 07/21/2020   Procedure: RIGHT/LEFT HEART CATH AND CORONARY ANGIOGRAPHY;  Surgeon: Larey Dresser, MD;  Location: [place] [facility];  Service: Cardiovascular;  Laterality: N/A;  . TEE WITHOUT CARDIOVERSION N/A 05/18/2020   Procedure: TRANSESOPHAGEAL ECHOCARDIOGRAM (TEE);  Surgeon: Larey Dresser, MD;  Location: [facility] ENDOSCOPY;  Service: Cardiovascular;  Laterality: N/A;  . TEE WITHOUT CARDIOVERSION N/A 12/05/2020   Procedure: TRANSESOPHAGEAL ECHOCARDIOGRAM (TEE);  Surgeon: Fay Records, MD;  Location: [facility] ENDOSCOPY;  Service: Cardiovascular;  Laterality: N/A;    Current Outpatient Medications  Medication Sig Dispense Refill  . ACCU-CHEK GUIDE test strip daily. for testing    . Accu-Chek Softclix Lancets lancets     . acetaminophen (TYLENOL) 500 MG tablet Take 1,000 mg by mouth every 6  (six) hours as needed for mild pain or headache.    Marland Kitchen amiodarone (PACERONE) 200 MG tablet Take 1 tablet (200 mg total) by mouth daily. 90 tablet 3  . apixaban (ELIQUIS) 5 MG TABS tablet Take 1 tablet (5 mg total) by mouth 2 (two) times daily. 180 tablet 3  . ascorbic acid (VITAMIN C) 1000 MG tablet Take 1,000 mg by mouth every evening.     Marland Kitchen atorvastatin (LIPITOR) 10 MG tablet Take 1 tablet (10 mg total) by mouth daily. 90 tablet 3  . Blood Glucose Monitoring Suppl (ACCU-CHEK GUIDE ME) w/Device KIT See admin instructions.    . carvedilol (COREG) 12.5 MG tablet Take 1 tablet (12.5 mg total) by mouth in the morning and at bedtime. 180 tablet 3  . dapagliflozin propanediol (FARXIGA) 10 MG TABS tablet Take 1 tablet (10 mg total) by mouth daily before breakfast. 90 tablet 3  . digoxin (LANOXIN) 0.125 MG tablet Take 0.5 tablets (0.0625 mg total) by mouth daily. 45 tablet 3  . Garlic 3976 MG CAPS Take 1,000 mg by mouth every evening.    . Glucosamine HCl 1000 MG TABS Take 1,000 mg by mouth every evening.    . Multiple Vitamin (MULTIVITAMIN WITH MINERALS) TABS tablet Take 1 tablet by mouth daily with breakfast.     . Omega-3 Fatty Acids (FISH OIL PO) Take 350 mg by mouth every  evening.     . pantoprazole (PROTONIX) 40 MG tablet Take 40 mg by mouth every evening.     . sacubitril-valsartan (ENTRESTO) 24-26 MG TAKE 1 TABLET BY MOUTH 2 (TWO) TIMES DAILY. 180 tablet 3  . Saw Palmetto, Serenoa repens, (SAW PALMETTO PO) Take 540 mg by mouth every evening.     . torsemide (DEMADEX) 10 MG tablet Take 1 tablet (10 mg total) by mouth daily. 90 tablet 6   No current facility-administered medications for this encounter.    Allergies  Allergen Reactions  . Aspirin Other (See Comments)    Bruises easily   . Novocain [Procaine] Other (See Comments)    Syncope (passed out)     Social History   Socioeconomic History  . Marital status: Married    Spouse name: Not on file  . Number of children: Not on file   . Years of education: Not on file  . Highest education level: Not on file  Occupational History  . Occupation: truck Geophysicist/field seismologist  Tobacco Use  . Smoking status: Former Smoker    Packs/day: 2.00    Years: 35.00    Pack years: 70.00    Types: Cigarettes    Quit date: 2000    Years since quitting: 22.2  . Smokeless tobacco: Never Used  Vaping Use  . Vaping Use: Never used  Substance and Sexual Activity  . Alcohol use: Yes    Alcohol/week: 1.0 - 2.0 standard drink    Types: 1 - 2 Cans of beer per week    Comment: Socially  . Drug use: No  . Sexual activity: Not on file  Other Topics Concern  . Not on file  Social History Narrative   Lives with wife in a one story home.  Has 3 children.     Works as a Administrator.     Education: 12th grade.   Social Determinants of Health   Financial Resource Strain: Not on file  Food Insecurity: Not on file  Transportation Needs: Not on file  Physical Activity: Not on file  Stress: Not on file  Social Connections: Not on file  Intimate Partner Violence: Not on file     ROS- All systems are reviewed and negative except as per the HPI above.  Physical Exam: Vitals:   01/04/21 1503  BP: (!) 92/58  Pulse: (!) 58  Weight: 110.4 kg  Height: 6' 1"  (1.854 m)    GEN- The patient is a well appearing obese male, alert and oriented x 3 today.   HEENT-head normocephalic, atraumatic, sclera clear, conjunctiva pink, hearing intact, trachea midline. Lungs- Clear to ausculation bilaterally, normal work of breathing Heart- Regular rate and rhythm, no murmurs, rubs or gallops  GI- soft, NT, ND, + BS Extremities- no clubbing, cyanosis, or edema MS- no significant deformity or atrophy Skin- no rash or lesion Psych- euthymic mood, full affect Neuro- strength and sensation are intact   Wt Readings from Last 3 Encounters:  01/04/21 110.4 kg  12/06/20 105.2 kg  12/05/20 105.2 kg    EKG today demonstrates  SB, LAFB, slow R wave prog Vent. rate  58 BPM PR interval 196 ms QRS duration 122 ms QT/QTcB 442/433 ms  Echo 03/21/20 demonstrated  1. Left ventricular ejection fraction, by estimation, is 40 to 45%. Left  ventricular ejection fraction by 2D MOD biplane is 40.7 %. The left  ventricle has mildly decreased function. The left ventricle demonstrates  global hypokinesis. Left ventricular  diastolic parameters are indeterminate.  2. Right ventricular systolic function is normal. The right ventricular  size is mildly enlarged. There is mildly elevated pulmonary artery  systolic pressure. The estimated right ventricular systolic pressure is  39.7 mmHg.  3. Left atrial size was mildly dilated.  4. Right atrial size was moderately dilated.  5. The mitral valve is normal in structure. No evidence of mitral valve  regurgitation.  6. The aortic valve is tricuspid. Aortic valve regurgitation is not  visualized. No aortic stenosis is present.  7. Aortic dilatation noted. There is mild dilatation of the ascending  aorta measuring 38 mm.  8. The inferior vena cava is dilated in size with <50% respiratory  variability, suggesting right atrial pressure of 15 mmHg.   Epic records are reviewed at length today  CHA2DS2-VASc Score = 3 The patient's score is based upon: CHF History: Yes HTN History: No Age : 39-74 Diabetes History: Yes Stroke History: No Vascular Disease History: No Gender: Male   ASSESSMENT AND PLAN: 1. Persistent Atrial Fibrillation (ICD10:  I48.19) The patient's CHA2DS2-VASc score is 3, indicating a 3.2% annual risk of stroke.   S/p afib ablation with Dr Rayann Heman on 12/06/20 Patient appears to be maintaining SR.  Continue amiodarone 200 mg daily for now, anticipate this will be short term with diagnosis of ILD. Continue Coreg 12.5 mg BID Continue digoxin 0.0625 mg daily Continue Eliquis 5 mg BID with no missed doses for 3 months post ablation.   2. Secondary Hypercoagulable State (ICD10:  D68.69) The  patient is at significant risk for stroke/thromboembolism based upon his CHA2DS2-VASc Score of 3.  Continue Apixaban (Eliquis).   3. Chronic systolic CHF Followed in the [facility] with Dr Aundra Dubin. No signs or symptoms of fluid overload today.   Follow up with Dr Rayann Heman as scheduled.    [facility] [address] [place], [place] [address] [phone] 01/04/2021 3:41 PM

## 2021-01-14 ENCOUNTER — Encounter (HOSPITAL_COMMUNITY): Payer: Self-pay

## 2021-01-17 ENCOUNTER — Telehealth: Payer: Self-pay | Admitting: Internal Medicine

## 2021-01-17 NOTE — Telephone Encounter (Signed)
Patient's wife would like a call back from [facility]   Pt c/o medication issue:  1. Name of Medication:   dapagliflozin propanediol (FARXIGA) 10 MG TABS tablet    2. How are you currently taking this medication (dosage and times per day)? 1 tablet daily  3. Are you having a reaction (difficulty breathing--STAT)? no  4. What is your medication issue? Patient's wife states the patient's creatine was going up so they stopped the amiodarone. She states they spoke with their pharmacist who told them the farxiga was the worst for his kidney's.

## 2021-01-17 NOTE — Telephone Encounter (Signed)
Spoke to the patient's wife and let her know that Dr. Rayann Heman stopped the medication that he could from an EP standpoint. I would forward this message to Dr. Claris Gladden team since he prescribes this medication. She verbalized understanding and thanked me for the call.

## 2021-01-18 NOTE — Telephone Encounter (Signed)
This is a little difficult to follow because he is sending messages so many different ways.  I responded to his email to me about Wilder Glade, see my answer.  Needs to stay well-hydrated, stay off torsemide, and get a repeat BMET Thursday or Friday.

## 2021-01-18 NOTE — Telephone Encounter (Signed)
Will forward to Dr Aundra Dubin for review and recommendations  Dr Aundra Dubin please advise regarding Wilder Glade

## 2021-02-17 ENCOUNTER — Other Ambulatory Visit (HOSPITAL_COMMUNITY): Payer: Self-pay | Admitting: Cardiology

## 2021-02-20 ENCOUNTER — Other Ambulatory Visit (HOSPITAL_COMMUNITY): Payer: Self-pay | Admitting: Cardiology

## 2021-03-03 ENCOUNTER — Encounter (HOSPITAL_COMMUNITY): Payer: Self-pay | Admitting: Cardiology

## 2021-03-03 ENCOUNTER — Other Ambulatory Visit: Payer: Self-pay

## 2021-03-03 ENCOUNTER — Ambulatory Visit (HOSPITAL_COMMUNITY)
Admission: RE | Admit: 2021-03-03 | Discharge: 2021-03-03 | Disposition: A | Discharge status: Home / Self Care | Payer: Commercial Managed Care - PPO | Source: Ambulatory Visit | Attending: Cardiology | Admitting: Cardiology

## 2021-03-03 VITALS — BP 108/60 | HR 55 | Wt 251.0 lb

## 2021-03-03 DIAGNOSIS — Z79899 Other long term (current) drug therapy: Secondary | ICD-10-CM | POA: Diagnosis not present

## 2021-03-03 DIAGNOSIS — Z85528 Personal history of other malignant neoplasm of kidney: Secondary | ICD-10-CM | POA: Diagnosis not present

## 2021-03-03 DIAGNOSIS — Z905 Acquired absence of kidney: Secondary | ICD-10-CM | POA: Diagnosis not present

## 2021-03-03 DIAGNOSIS — I480 Paroxysmal atrial fibrillation: Secondary | ICD-10-CM | POA: Insufficient documentation

## 2021-03-03 DIAGNOSIS — I5022 Chronic systolic (congestive) heart failure: Secondary | ICD-10-CM | POA: Insufficient documentation

## 2021-03-03 DIAGNOSIS — N183 Chronic kidney disease, stage 3 unspecified: Secondary | ICD-10-CM | POA: Insufficient documentation

## 2021-03-03 DIAGNOSIS — Z7901 Long term (current) use of anticoagulants: Secondary | ICD-10-CM | POA: Diagnosis not present

## 2021-03-03 DIAGNOSIS — E1122 Type 2 diabetes mellitus with diabetic chronic kidney disease: Secondary | ICD-10-CM | POA: Diagnosis not present

## 2021-03-03 DIAGNOSIS — Z8249 Family history of ischemic heart disease and other diseases of the circulatory system: Secondary | ICD-10-CM | POA: Diagnosis not present

## 2021-03-03 DIAGNOSIS — E785 Hyperlipidemia, unspecified: Secondary | ICD-10-CM | POA: Diagnosis not present

## 2021-03-03 DIAGNOSIS — Z8616 Personal history of COVID-19: Secondary | ICD-10-CM | POA: Diagnosis not present

## 2021-03-03 DIAGNOSIS — I2510 Atherosclerotic heart disease of native coronary artery without angina pectoris: Secondary | ICD-10-CM | POA: Insufficient documentation

## 2021-03-03 DIAGNOSIS — I5042 Chronic combined systolic (congestive) and diastolic (congestive) heart failure: Secondary | ICD-10-CM | POA: Diagnosis present

## 2021-03-03 DIAGNOSIS — Z8711 Personal history of peptic ulcer disease: Secondary | ICD-10-CM | POA: Diagnosis not present

## 2021-03-03 DIAGNOSIS — Z87891 Personal history of nicotine dependence: Secondary | ICD-10-CM | POA: Insufficient documentation

## 2021-03-03 DIAGNOSIS — E1140 Type 2 diabetes mellitus with diabetic neuropathy, unspecified: Secondary | ICD-10-CM | POA: Insufficient documentation

## 2021-03-03 DIAGNOSIS — Z86718 Personal history of other venous thrombosis and embolism: Secondary | ICD-10-CM | POA: Diagnosis not present

## 2021-03-03 DIAGNOSIS — Z7984 Long term (current) use of oral hypoglycemic drugs: Secondary | ICD-10-CM | POA: Diagnosis not present

## 2021-03-03 HISTORY — DX: Heart failure, unspecified: I50.9

## 2021-03-03 LAB — BASIC METABOLIC PANEL
Anion gap: 7 (ref 5–15)
BUN: 27 mg/dL — ABNORMAL HIGH (ref 8–23)
CO2: 25 mmol/L (ref 22–32)
Calcium: 8.7 mg/dL — ABNORMAL LOW (ref 8.9–10.3)
Chloride: 103 mmol/L (ref 98–111)
Creatinine, Ser: 2.09 mg/dL — ABNORMAL HIGH (ref 0.61–1.24)
GFR, Estimated: 33 mL/min — ABNORMAL LOW (ref >60)
Glucose, Bld: 111 mg/dL — ABNORMAL HIGH (ref 70–99)
Potassium: 4.5 mmol/L (ref 3.5–5.1)
Sodium: 135 mmol/L (ref 135–145)

## 2021-03-03 LAB — LIPID PANEL
Cholesterol: 159 mg/dL (ref 0–200)
HDL: 53 mg/dL (ref >40)
LDL Cholesterol: 94 mg/dL (ref 0–99)
Total CHOL/HDL Ratio: 3 RATIO
Triglycerides: 60 mg/dL (ref <150)
VLDL: 12 mg/dL (ref 0–40)

## 2021-03-03 LAB — BRAIN NATRIURETIC PEPTIDE: B Natriuretic Peptide: 461.1 pg/mL — ABNORMAL HIGH (ref 0.0–100.0)

## 2021-03-03 LAB — DIGOXIN LEVEL: Digoxin Level: 0.6 ng/mL — ABNORMAL LOW (ref 0.8–2.0)

## 2021-03-03 MED ORDER — SPIRONOLACTONE 25 MG PO TABS
12.5000 mg | ORAL_TABLET | Freq: Every day | ORAL | 3 refills | Status: DC
Start: 2021-03-03 — End: 2021-09-08

## 2021-03-03 NOTE — Patient Instructions (Signed)
EKG done today.  Labs done today. We will contact you only if your labs are abnormal.  START Spironolactone 12.5mg (1/2 tablet) by mouth daily.  No other medication changes were made. Please continue all current medications as prescribed.  Your physician recommends that you schedule a follow-up appointment in: 2 weeks for a lab only appointment(can be done at [address]) 2 months for an appointment with Dr. Aundra Dubin with an echo prior to your exam.  Your physician has requested that you have an echocardiogram. Echocardiography is a painless test that uses sound waves to create images of your heart. It provides your doctor with information about the size and shape of your heart and how well your heart's chambers and valves are working. This procedure takes approximately one hour. There are no restrictions for this procedure.   If you have any questions or concerns before your next appointment please send [place] a message through [place] or call our office at [phone].    TO LEAVE A MESSAGE FOR THE NURSE SELECT OPTION 2, PLEASE LEAVE A MESSAGE INCLUDING: . YOUR NAME . DATE OF BIRTH . CALL BACK NUMBER . REASON FOR CALL**this is important as we prioritize the call backs  YOU WILL RECEIVE A CALL BACK THE SAME DAY AS LONG AS YOU CALL BEFORE 4:00 PM   Do the following things EVERYDAY: 1) Weigh yourself in the morning before breakfast. Write it down and keep it in a log. 2) Take your medicines as prescribed 3) Eat low salt foods--Limit salt (sodium) to 2000 mg per day.  4) Stay as active as you can everyday 5) Limit all fluids for the day to less than 2 liters   At the [facility], you and your health needs are our priority. As part of our continuing mission to provide you with exceptional heart care, we have created designated Provider Care Teams. These Care Teams include your primary Cardiologist (physician) and Advanced Practice Providers (APPs- Physician Assistants and Nurse  Practitioners) who all work together to provide you with the care you need, when you need it.   You may see any of the following providers on your designated Care Team at your next follow up: Marland Kitchen Dr Glori Bickers . Dr [name] . Darrick Grinder, NP . Lyda Jester, PA . Audry Riles, PharmD   Please be sure to bring in all your medications bottles to every appointment.

## 2021-03-05 NOTE — Progress Notes (Signed)
PCP: [name], MD Cardiology: Dr. Oval Linsey EP: Dr. Rayann Heman HF Cardiology: Dr. Aundra Dubin  75 y.o. with history of COVID-19 PNA, atrial fibrillation, and CHF was referred by Dr. Oval Linsey for evaluation of CHF.  Patient has history of renal cell carcinoma with right nephrectomy and diabetes, has CKD stage 3 as well.  He was feeling good until 1/21, when he developed COVID-19 PNA.  He was not hospitalized, but had a severe bout with it.  In 2/21, he was admitted with GI bleeding from gastric ulcers and new atrial fibrillation. He had EGD with clipping of ulcers.  Eventually, he was startedon Eliquis and had DCCV to NSR.  He additionally was noted to have RLE DVT in 2/21.  Echo in 2/21 hospitalization showed EF 25-30%.  Lexiscan Cardiolite showed inferior scar versus artifact and no ischemia.  Repeat echo in 3/21 showed EF still 30-35%.   He had ERAF after 3/21 DCCV and was re-admitted in 5/21 for Tikosyn initiation.  He had repeat DCCV to NSR this admission.  He was only on Tikosyn 125 mcg bid at time of discharge.  He went back into atrial fibrillation again and I cardioverted him to NSR later in 5/21.   Echo in 6/21 showed improved EF 40-45%, RV normal.    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  Tikosyn was stopped, Coreg was increased to 12.5 mg bid.    TEE in 8/21 showed EF 35-40%, global HK, mildly decreased RV function, no LAA thrombus.  Patient was cardioverted to NSR.   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    In 3/22, he had atrial fibrillation ablation. TEE at that time showed EF 30-35%, moderate RV dysfunction.   He returns for followup of CHF and atrial fibrillation.  He remains in NSR today after atrial fibrillation ablation. He is no longer on amiodarone.  He denies dyspnea unless with heavy exertion.  No orthopnea/PND.  No chest  pain.  Weight up about 13 lbs.  No lightheadedness.   ECG (personally reviewed): NSR, LAFB, PVC  Labs (5/21): K 4.5, creatinine 1.73 => 1.9, digoxin 0.9 Labs (6/21): K 5.2, creatinine 1.76 Labs (8/21): K 4.8, creatinine 1.9  PMH: 1. Atrial fibrillation: Paroxysmal.  - DCCV to NSR in 3/21, ERAF.   - Admitted in 5/21 for Tikosyn intiation and repeat DCCV to NSR.  - LAA thrombus in 7/21, atrial fibrillation ablation not done and Tikosyn stopped.  - Amiodarone started, repeat TEE in 8/21 with no thrombus and DCCV to NSR.  - Atrial fibrillation ablation 3/22 2. 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 Renal cell carcinoma s/p right nephrectomy 4. CKD: Stage 3. 5. Hyperlipidemia.  6. H/o COVID-19 PNA in 1/21.  7. H/o GI bleeding from gastric ulcers in 3/21.  8. Cardiomyopathy: Echo (2/21) with EF 25-30%.  - Cardiolite (2/21) with inferior scar, no ischemia.  - Echo (3/21): EF 30-35%, global hypokinesis, mild MR.  - Echo (6/21): EF 40-45%, RV normal size and systolic function, PASP 36, IVC dilated.  - TEE (8/21): EF 35-40%, diffuse hypokinesis, mildly decreased RV systolic function.  - LHC/RHC (10/21): 40% D2 stenosis, 40% PDA stenosis; mean RA 18, PA 51/23 mean 32, mean PCWP 22, CI 2.2 Thermo/1.9 Fick, PVR 1.9 WU.  - TEE (2/22): EF 30-35%, diffuse hypokinesis, moderately decreased RV systolic function.  9. RLE DVT in 2/21 post-COVID-19  PNA.   Social History   Socioeconomic History  . Marital status: Married    Spouse name: Not on file  . Number of children: Not on file  . Years of education: Not on file  . Highest education level: Not on file  Occupational History  . Occupation: truck Geophysicist/field seismologist  Tobacco Use  . Smoking status: Former Smoker    Packs/day: 2.00    Years: 35.00    Pack years: 70.00    Types: Cigarettes    Quit date: 2000    Years since quitting: 22.4  . Smokeless tobacco: Never Used  Vaping Use  . Vaping Use: Never used  Substance and Sexual Activity  .  Alcohol use: Yes    Alcohol/week: 1.0 - 2.0 standard drink    Types: 1 - 2 Cans of beer per week    Comment: Socially  . Drug use: No  . Sexual activity: Not on file  Other Topics Concern  . Not on file  Social History Narrative   Lives with wife in a one story home.  Has 3 children.     Works as a Administrator.     Education: 12th grade.   Social Determinants of Health   Financial Resource Strain: Not on file  Food Insecurity: Not on file  Transportation Needs: Not on file  Physical Activity: Not on file  Stress: Not on file  Social Connections: Not on file  Intimate Partner Violence: Not on file   Family History  Problem Relation Age of Onset  . Cancer Mother   . Heart attack Father   . Heart attack Brother   . Heart disease Sister   . Stroke Paternal Grandmother    ROS: All systems reviewed and negative except as per HPI.   Current Outpatient Medications  Medication Sig Dispense Refill  . ACCU-CHEK GUIDE test strip daily. for testing    . Accu-Chek Softclix Lancets lancets     . acetaminophen (TYLENOL) 500 MG tablet Take 1,000 mg by mouth every 6 (six) hours as needed for mild pain or headache.    Marland Kitchen apixaban (ELIQUIS) 5 MG TABS tablet Take 1 tablet (5 mg total) by mouth 2 (two) times daily. 180 tablet 3  . ascorbic acid (VITAMIN C) 1000 MG tablet Take 1,000 mg by mouth every evening.     Marland Kitchen atorvastatin (LIPITOR) 10 MG tablet Take 1 tablet (10 mg total) by mouth daily. 90 tablet 3  . Blood Glucose Monitoring Suppl (ACCU-CHEK GUIDE ME) w/Device KIT See admin instructions.    . carvedilol (COREG) 12.5 MG tablet Take 1 tablet (12.5 mg total) by mouth in the morning and at bedtime. 180 tablet 3  . dapagliflozin propanediol (FARXIGA) 10 MG TABS tablet Take 1 tablet (10 mg total) by mouth daily before breakfast. 90 tablet 3  . digoxin (LANOXIN) 0.125 MG tablet Take 0.5 tablets (0.0625 mg total) by mouth daily. 45 tablet 3  . Garlic 0630 MG CAPS Take 1,000 mg by mouth every  evening.    . Glucosamine HCl 1000 MG TABS Take 1,000 mg by mouth every evening.    . Multiple Vitamin (MULTIVITAMIN WITH MINERALS) TABS tablet Take 1 tablet by mouth daily with breakfast.     . Omega-3 Fatty Acids (FISH OIL PO) Take 350 mg by mouth every evening.     . pantoprazole (PROTONIX) 40 MG tablet Take 40 mg by mouth every evening.     . sacubitril-valsartan (ENTRESTO) 24-26 MG TAKE 1 TABLET BY  MOUTH 2 (TWO) TIMES DAILY. 180 tablet 3  . Saw Palmetto, Serenoa repens, (SAW PALMETTO PO) Take 540 mg by mouth every evening.     Marland Kitchen spironolactone (ALDACTONE) 25 MG tablet Take 0.5 tablets (12.5 mg total) by mouth daily. 45 tablet 3   No current facility-administered medications for this encounter.   BP 108/60   Pulse (!) 55   Wt 113.9 kg (251 lb)   SpO2 96%   BMI 33.12 kg/m  General: NAD Neck: No JVD, no thyromegaly or thyroid nodule.  Lungs: Clear to auscultation bilaterally with normal respiratory effort. CV: Nondisplaced PMI.  Heart regular S1/S2, no S3/S4, no murmur.  Trace ankle edema.  No carotid bruit.  Normal pedal pulses.  Abdomen: Soft, nontender, no hepatosplenomegaly, no distention.  Skin: Intact without lesions or rashes.  Neurologic: Alert and oriented x 3.  Psych: Normal affect. Extremities: No clubbing or cyanosis.  HEENT: Normal.   Assessment/Plan: 1. Chronic systolic CHF: Echo in 9/39 with EF 25-30%, echo in 3/21 with EF 30-35%.  Cardiolite with inferior scar vs artifact, no ischemia. Cath in 10/21 with nonobstructive CAD.  Cause of cardiomyopathy is uncertain.  Could be related to atrial fibrillation (though not generally in RVR), cannot rule out cardiomyopathy related to myocarditis (?COVID-19 myocarditis).  Repeat echo in 6/21 showed EF up to 40-45%.  TEE in 8/21 with EF 35-40%.  TEE in 3/22 with EF 30-35% and moderate RV dysfunction.  NYHA class II symptoms, he is not volume overloaded on exam. Medication titration has been limited by soft blood pressure.  -  Continue Coreg 12.5 mg bid.  - Continue Entresto 24/26 bid. .  - Continue digoxin, check level today.    - Add spironolactone 12.5 daily with BMET today and in 10 days.  - Continue dapagliflozin 10 mg daily.   - He is back in NSR now s/p atrial fibrillation ablation.  Repeat echo in 2 months, if EF remains < 35% will need to consider ICD.  Not CRT candidate with narrow QRS.    2. Atrial fibrillation:  DCCV x 2 on Tikosyn and DCCV x 1 on amiodarone.  He finally had atrial fibrillation ablation in 3/22 and is now in NSR.  He is off amiodarone.  - Continue Eliquis.  3. CKD: Stage 3.  BMET today.  Followup 2 months with echo  [name] 03/05/2021

## 2021-03-09 ENCOUNTER — Other Ambulatory Visit (HOSPITAL_COMMUNITY): Payer: Self-pay | Admitting: Cardiology

## 2021-03-14 ENCOUNTER — Telehealth (HOSPITAL_COMMUNITY): Payer: Self-pay | Admitting: *Deleted

## 2021-03-14 NOTE — Telephone Encounter (Signed)
OK to write the above note.

## 2021-03-14 NOTE — Telephone Encounter (Signed)
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 He has to have a yearly letter.   Last years letter   "Mr. Keadle has been followed closely in our clinic for his cardiac conditions. 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

## 2021-03-15 ENCOUNTER — Other Ambulatory Visit: Payer: Self-pay

## 2021-03-15 ENCOUNTER — Ambulatory Visit (INDEPENDENT_AMBULATORY_CARE_PROVIDER_SITE_OTHER): Payer: Commercial Managed Care - PPO | Admitting: Internal Medicine

## 2021-03-15 VITALS — BP 114/76 | HR 55 | Ht 73.0 in | Wt 246.0 lb

## 2021-03-15 DIAGNOSIS — I519 Heart disease, unspecified: Secondary | ICD-10-CM | POA: Diagnosis not present

## 2021-03-15 DIAGNOSIS — I428 Other cardiomyopathies: Secondary | ICD-10-CM

## 2021-03-15 DIAGNOSIS — I4819 Other persistent atrial fibrillation: Secondary | ICD-10-CM | POA: Diagnosis not present

## 2021-03-15 MED ORDER — PANTOPRAZOLE SODIUM 40 MG PO TBEC
40.0000 mg | DELAYED_RELEASE_TABLET | Freq: Every day | ORAL | 4 refills | Status: DC | PRN
Start: 2021-03-15 — End: 2021-05-08

## 2021-03-15 NOTE — Progress Notes (Signed)
PCP: [name], MD Primary Cardiologist: Dr Dara Hoyer is a 75 y.o. male who presents today for routine electrophysiology followup.  Since his recent afib ablation, the patient reports doing very well.  he denies procedure related complications and is pleased with the results of the procedure.  Today, he denies symptoms of palpitations, chest pain, shortness of breath,  lower extremity edema, dizziness, presyncope, or syncope.  The patient is otherwise without complaint today.   Past Medical History:  Diagnosis Date  . CHF (congestive heart failure) ([place])   . Chronic combined systolic and diastolic heart failure ([place]) 12/13/2019  . Chronic kidney disease   . CKD (chronic kidney disease) stage 3, GFR 30-59 ml/min ([facility]) 12/13/2019  . Diabetes mellitus ([place])   . Gastric ulcer 12/13/2019   +H. Pylori. Bleeding required clipping.  . H/O blood clots   . History of kidney cancer   . Persistent atrial fibrillation ([place]) 12/13/2019  . Renal cell carcinoma ([place]) 12/13/2019   S/p nephrectomy   Past Surgical History:  Procedure Laterality Date  . APPENDECTOMY    . ATRIAL FIBRILLATION ABLATION N/A 04/07/2020   Procedure: ATRIAL FIBRILLATION ABLATION;  Surgeon: [name], MD;  Location: [place] [facility];  Service: Cardiovascular;  Laterality: N/A;  . ATRIAL FIBRILLATION ABLATION N/A 12/06/2020   Procedure: ATRIAL FIBRILLATION ABLATION;  Surgeon: [name], MD;  Location: [place] [facility];  Service: Cardiovascular;  Laterality: N/A;  . BIOPSY  11/19/2019   Procedure: BIOPSY;  Surgeon: Ronald Lobo, MD;  Location: [place];  Service: Endoscopy;;  . CARDIOVERSION N/A 12/17/2019   Procedure: CARDIOVERSION;  Surgeon: Geralynn Rile, MD;  Location: [place];  Service: Cardiovascular;  Laterality: N/A;  . CARDIOVERSION N/A 02/18/2020   Procedure: CARDIOVERSION;  Surgeon: Jerline Pain, MD;  Location: [facility] ENDOSCOPY;  Service: Cardiovascular;  Laterality: N/A;  .  CARDIOVERSION N/A 02/29/2020   Procedure: CARDIOVERSION;  Surgeon: Larey Dresser, MD;  Location[facility] ENDOSCOPY;  Service: Cardiovascular;  Laterality: N/A;  . CARDIOVERSION N/A 06/06/2020   Procedure: CARDIOVERSION;  Surgeon: Larey Dresser, MD;  Location: [facility] ENDOSCOPY;  Service: Cardiovascular;  Laterality: N/A;  . ESOPHAGOGASTRODUODENOSCOPY N/A 11/19/2019   Procedure: ESOPHAGOGASTRODUODENOSCOPY (EGD);  Surgeon: Ronald Lobo, MD;  Location: [facility] ENDOSCOPY;  Service: Endoscopy;  Laterality: N/A;  . HEMOSTASIS CLIP PLACEMENT  11/19/2019   Procedure: HEMOSTASIS CLIP PLACEMENT;  Surgeon: Ronald Lobo, MD;  Location: [place];  Service: Endoscopy;;  . NEPHRECTOMY    . RIGHT/LEFT HEART CATH AND CORONARY ANGIOGRAPHY N/A 07/21/2020   Procedure: RIGHT/LEFT HEART CATH AND CORONARY ANGIOGRAPHY;  Surgeon: Larey Dresser, MD;  Location: [place] [facility];  Service: Cardiovascular;  Laterality: N/A;  . TEE WITHOUT CARDIOVERSION N/A 05/18/2020   Procedure: TRANSESOPHAGEAL ECHOCARDIOGRAM (TEE);  Surgeon: Larey Dresser, MD;  Location: [facility] ENDOSCOPY;  Service: Cardiovascular;  Laterality: N/A;  . TEE WITHOUT CARDIOVERSION N/A 12/05/2020   Procedure: TRANSESOPHAGEAL ECHOCARDIOGRAM (TEE);  Surgeon: Fay Records, MD;  Location: [facility] ENDOSCOPY;  Service: Cardiovascular;  Laterality: N/A;    ROS- all systems are personally reviewed and negatives except as per HPI above  Current Outpatient Medications  Medication Sig Dispense Refill  . ACCU-CHEK GUIDE test strip daily. for testing    . Accu-Chek Softclix Lancets lancets     . acetaminophen (TYLENOL) 500 MG tablet Take 1,000 mg by mouth every 6 (six) hours as needed for mild pain or headache.    Marland Kitchen apixaban (ELIQUIS) 5 MG TABS tablet Take  1 tablet (5 mg total) by mouth 2 (two) times daily. 180 tablet 3  . ascorbic acid (VITAMIN C) 1000 MG tablet Take 1,000 mg by mouth every evening.     Marland Kitchen atorvastatin (LIPITOR) 10 MG tablet Take 1 tablet (10 mg total) by  mouth daily. 90 tablet 3  . Blood Glucose Monitoring Suppl (ACCU-CHEK GUIDE ME) w/Device KIT See admin instructions.    . carvedilol (COREG) 12.5 MG tablet Take 1 tablet (12.5 mg total) by mouth in the morning and at bedtime. 180 tablet 3  . dapagliflozin propanediol (FARXIGA) 10 MG TABS tablet Take 1 tablet (10 mg total) by mouth daily before breakfast. 90 tablet 3  . digoxin (LANOXIN) 0.125 MG tablet Take 0.5 tablets (0.0625 mg total) by mouth daily. 45 tablet 3  . Garlic 1700 MG CAPS Take 1,000 mg by mouth every evening.    . Glucosamine HCl 1000 MG TABS Take 1,000 mg by mouth every evening.    . Multiple Vitamin (MULTIVITAMIN WITH MINERALS) TABS tablet Take 1 tablet by mouth daily with breakfast.     . Omega-3 Fatty Acids (FISH OIL PO) Take 350 mg by mouth every evening.     . pantoprazole (PROTONIX) 40 MG tablet Take 40 mg by mouth every evening.     . sacubitril-valsartan (ENTRESTO) 24-26 MG TAKE 1 TABLET BY MOUTH 2 (TWO) TIMES DAILY. 180 tablet 3  . Saw Palmetto, Serenoa repens, (SAW PALMETTO PO) Take 540 mg by mouth every evening.     Marland Kitchen spironolactone (ALDACTONE) 25 MG tablet Take 0.5 tablets (12.5 mg total) by mouth daily. 45 tablet 3  . torsemide (DEMADEX) 10 MG tablet Take 10 mg by mouth as needed for fluid.     No current facility-administered medications for this visit.    Physical Exam: Vitals:   03/15/21 1551  BP: 114/76  Pulse: (!) 55  SpO2: 95%  Weight: 246 lb (111.6 kg)  Height: 6' 1" (1.854 m)    GEN- The patient is well appearing, alert and oriented x 3 today.   Head- normocephalic, atraumatic Eyes-  Sclera clear, conjunctiva pink Ears- hearing intact Oropharynx- clear Lungs-  normal work of breathing Heart- Regular rate and rhythm  GI- soft  Extremities- no clubbing, cyanosis, or edema  EKG tracing ordered today is personally reviewed and shows sinus rhythm 55 bpm, PVCs  Assessment and Plan:  1. Persistent  atrial fibrillation Doing well s/p ablation  off amiodarone chads2vasc score is 3.  He is on eliquis  2. 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   3. GERD Change protonix to prn  Return to see me in 4 months  [name] MD, [facility] 03/15/2021 4:21 PM

## 2021-03-15 NOTE — Patient Instructions (Addendum)
Medication Instructions:  Protonix as needed daily Your physician recommends that you continue on your current medications as directed. Please refer to the Current Medication list given to you today.  Labwork: None ordered.  Testing/Procedures: None ordered.  Follow-Up: Your physician wants you to follow-up in: 07/17/21 at 4 pm with Dr. Rayann Heman.  Any Other Special Instructions Will Be Listed Below (If Applicable).  If you need a refill on your cardiac medications before your next appointment, please call your pharmacy.

## 2021-03-16 ENCOUNTER — Encounter (HOSPITAL_COMMUNITY): Payer: Self-pay

## 2021-03-16 ENCOUNTER — Encounter (HOSPITAL_COMMUNITY): Payer: Self-pay | Admitting: *Deleted

## 2021-03-16 DIAGNOSIS — G608 Other hereditary and idiopathic neuropathies: Secondary | ICD-10-CM | POA: Diagnosis not present

## 2021-03-16 NOTE — Telephone Encounter (Signed)
Good afternoon, We can have your letter ready for pick up at the front desk by tomorrow morning.

## 2021-03-17 ENCOUNTER — Encounter: Payer: Self-pay | Admitting: Neurology

## 2021-03-17 NOTE — Addendum Note (Signed)
Addended by: Rose Phi on: 03/17/2021 09:21 AM   Modules accepted: Orders

## 2021-03-19 ENCOUNTER — Other Ambulatory Visit (HOSPITAL_COMMUNITY): Payer: Self-pay | Admitting: Cardiology

## 2021-03-20 NOTE — Telephone Encounter (Signed)
Letter was sent via mychart by jasmine brown

## 2021-05-08 ENCOUNTER — Other Ambulatory Visit: Payer: Self-pay

## 2021-05-08 ENCOUNTER — Ambulatory Visit (HOSPITAL_COMMUNITY)
Admission: RE | Admit: 2021-05-08 | Discharge: 2021-05-08 | Disposition: A | Discharge status: Home / Self Care | Payer: Commercial Managed Care - PPO | Source: Ambulatory Visit | Attending: Cardiology | Admitting: Cardiology

## 2021-05-08 ENCOUNTER — Encounter (HOSPITAL_COMMUNITY): Payer: Self-pay | Admitting: Cardiology

## 2021-05-08 ENCOUNTER — Ambulatory Visit (HOSPITAL_BASED_OUTPATIENT_CLINIC_OR_DEPARTMENT_OTHER)
Admission: RE | Admit: 2021-05-08 | Discharge: 2021-05-08 | Disposition: A | Discharge status: Home / Self Care | Payer: Commercial Managed Care - PPO | Source: Ambulatory Visit | Attending: Cardiology | Admitting: Cardiology

## 2021-05-08 VITALS — BP 102/60 | HR 50 | Wt 248.6 lb

## 2021-05-08 DIAGNOSIS — I5042 Chronic combined systolic (congestive) and diastolic (congestive) heart failure: Secondary | ICD-10-CM | POA: Diagnosis not present

## 2021-05-08 DIAGNOSIS — Z87891 Personal history of nicotine dependence: Secondary | ICD-10-CM | POA: Insufficient documentation

## 2021-05-08 DIAGNOSIS — E1122 Type 2 diabetes mellitus with diabetic chronic kidney disease: Secondary | ICD-10-CM | POA: Diagnosis not present

## 2021-05-08 DIAGNOSIS — I480 Paroxysmal atrial fibrillation: Secondary | ICD-10-CM | POA: Diagnosis not present

## 2021-05-08 DIAGNOSIS — N183 Chronic kidney disease, stage 3 unspecified: Secondary | ICD-10-CM | POA: Diagnosis not present

## 2021-05-08 DIAGNOSIS — I4891 Unspecified atrial fibrillation: Secondary | ICD-10-CM

## 2021-05-08 LAB — ECHOCARDIOGRAM COMPLETE
Area-P 1/2: 1.4 cm2
Calc EF: 43.7 %
S' Lateral: 4.4 cm
Single Plane A2C EF: 43.5 %
Single Plane A4C EF: 45.4 %

## 2021-05-08 LAB — DIGOXIN LEVEL: Digoxin Level: 0.6 ng/mL — ABNORMAL LOW (ref 0.8–2.0)

## 2021-05-08 MED ORDER — ATORVASTATIN CALCIUM 20 MG PO TABS: 20.0000 mg | ORAL_TABLET | Freq: Every day | ORAL | 3 refills | Status: DC

## 2021-05-08 NOTE — Progress Notes (Signed)
PCP: [name], MD Cardiology: Dr. Oval Linsey EP: Dr. Rayann Heman HF Cardiology: Dr. Aundra Dubin  75 y.o. with history of COVID-19 PNA, atrial fibrillation, and CHF was referred by Dr. Oval Linsey for evaluation of CHF.  Patient has history of renal cell carcinoma with right nephrectomy and diabetes, has CKD stage 3 as well.  He was feeling good until 1/21, when he developed COVID-19 PNA.  He was not hospitalized, but had a severe bout with it.  In 2/21, he was admitted with GI bleeding from gastric ulcers and new atrial fibrillation. He had EGD with clipping of ulcers.  Eventually, he was startedon Eliquis and had DCCV to NSR.  He additionally was noted to have RLE DVT in 2/21.  Echo in 2/21 hospitalization showed EF 25-30%.  Lexiscan Cardiolite showed inferior scar versus artifact and no ischemia.  Repeat echo in 3/21 showed EF still 30-35%.   He had ERAF after 3/21 DCCV and was re-admitted in 5/21 for Tikosyn initiation.  He had repeat DCCV to NSR this admission.  He was only on Tikosyn 125 mcg bid at time of discharge.  He went back into atrial fibrillation again and I cardioverted him to NSR later in 5/21.   Echo in 6/21 showed improved EF 40-45%, RV normal.    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  Tikosyn was stopped, Coreg was increased to 12.5 mg bid.    TEE in 8/21 showed EF 35-40%, global HK, mildly decreased RV function, no LAA thrombus.  Patient was cardioverted to NSR.   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    In 3/22, he had atrial fibrillation ablation. TEE at that time showed EF 30-35%, moderate RV dysfunction.   Echo was done today and reviewed, EF 40-45%, diffuse hypokinesis, normal RV.   He returns for followup of CHF and atrial fibrillation.  He remains in NSR today after atrial fibrillation ablation. He is working full time as a  Administrator.  No significant exertional dyspnea.  He does some unloading but mostly drives.  No chest pain.  No orthopnea/PND.  No lightheadedness.  Weight down 3 lbs.  He has not used any torsemide.   ECG (personally reviewed): NSR, LAFB  Labs (5/21): K 4.5, creatinine 1.73 => 1.9, digoxin 0.9 Labs (6/21): K 5.2, creatinine 1.76 Labs (8/21): K 4.8, creatinine 1.9 Labs (5/22): digoxin 0.6, K 4.5, creatinine 2.09, hgb 14.2, LDL 94, BNP 461 Labs (7/22): K 4.8, creatinine 2.03  PMH: 1. Atrial fibrillation: Paroxysmal.  - DCCV to NSR in 3/21, ERAF.   - Admitted in 5/21 for Tikosyn intiation and repeat DCCV to NSR.  - LAA thrombus in 7/21, atrial fibrillation ablation not done and Tikosyn stopped.  - Amiodarone started, repeat TEE in 8/21 with no thrombus and DCCV to NSR.  - Atrial fibrillation ablation 3/22 2. 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 Renal cell carcinoma s/p right nephrectomy 4. CKD: Stage 3. 5. Hyperlipidemia.  6. H/o COVID-19 PNA in 1/21.  7. H/o GI bleeding from gastric ulcers in 3/21.  8. Cardiomyopathy: Echo (2/21) with EF 25-30%.  - Cardiolite (2/21) with inferior scar, no ischemia.  - Echo (3/21): EF 30-35%, global hypokinesis, mild MR.  - Echo (6/21): EF 40-45%, RV normal size and systolic function, PASP 36, IVC dilated.  - TEE (8/21): EF 35-40%, diffuse hypokinesis, mildly decreased RV systolic function.  -  LHC/RHC (10/21): 40% D2 stenosis, 40% PDA stenosis; mean RA 18, PA 51/23 mean 32, mean PCWP 22, CI 2.2 Thermo/1.9 Fick, PVR 1.9 WU.  - TEE (2/22): EF 30-35%, diffuse hypokinesis, moderately decreased RV systolic function.  - Echo (8/22): EF 40-45%, diffuse hypokinesis, normal RV.  9. RLE DVT in 2/21 post-COVID-19 PNA.   Social History   Socioeconomic History   Marital status: Married    Spouse name: Not on file   Number of children: Not on file   Years of education: Not on file   Highest education level: Not on file  Occupational History    Occupation: truck driver  Tobacco Use   Smoking status: Former    Packs/day: 2.00    Years: 35.00    Pack years: 70.00    Types: Cigarettes    Quit date: 2000    Years since quitting: 22.5   Smokeless tobacco: Never  Vaping Use   Vaping Use: Never used  Substance and Sexual Activity   Alcohol use: Yes    Alcohol/week: 1.0 - 2.0 standard drink    Types: 1 - 2 Cans of beer per week    Comment: Socially   Drug use: No   Sexual activity: Not on file  Other Topics Concern   Not on file  Social History Narrative   Lives with wife in a one story home.  Has 3 children.     Works as a Administrator.     Education: 12th grade.   Social Determinants of Health   Financial Resource Strain: Not on file  Food Insecurity: Not on file  Transportation Needs: Not on file  Physical Activity: Not on file  Stress: Not on file  Social Connections: Not on file  Intimate Partner Violence: Not on file   Family History  Problem Relation Age of Onset   Cancer Mother    Heart attack Father    Heart attack Brother    Heart disease Sister    Stroke Paternal Grandmother    ROS: All systems reviewed and negative except as per HPI.   Current Outpatient Medications  Medication Sig Dispense Refill   ACCU-CHEK GUIDE test strip daily. for testing     Accu-Chek Softclix Lancets lancets      acetaminophen (TYLENOL) 500 MG tablet Take 1,000 mg by mouth every 6 (six) hours as needed for mild pain or headache.     apixaban (ELIQUIS) 5 MG TABS tablet Take 1 tablet (5 mg total) by mouth 2 (two) times daily. 180 tablet 3   ascorbic acid (VITAMIN C) 1000 MG tablet Take 1,000 mg by mouth every evening.      Blood Glucose Monitoring Suppl (ACCU-CHEK GUIDE ME) w/Device KIT See admin instructions.     carvedilol (COREG) 12.5 MG tablet Take 1 tablet (12.5 mg total) by mouth in the morning and at bedtime. 180 tablet 3   dapagliflozin propanediol (FARXIGA) 10 MG TABS tablet Take 1 tablet (10 mg total) by mouth daily  before breakfast. 90 tablet 3   digoxin (LANOXIN) 0.125 MG tablet Take 0.5 tablets (0.0625 mg total) by mouth daily. 45 tablet 3   Garlic 1610 MG CAPS Take 1,000 mg by mouth every evening.     Glucosamine HCl 1000 MG TABS Take 1,000 mg by mouth every evening.     Multiple Vitamin (MULTIVITAMIN WITH MINERALS) TABS tablet Take 1 tablet by mouth daily with breakfast.      Omega-3 Fatty Acids (FISH OIL PO) Take 350 mg  by mouth every evening.      sacubitril-valsartan (ENTRESTO) 24-26 MG TAKE 1 TABLET BY MOUTH 2 (TWO) TIMES DAILY. 180 tablet 3   Saw Palmetto, Serenoa repens, (SAW PALMETTO PO) Take 540 mg by mouth every evening.      spironolactone (ALDACTONE) 25 MG tablet Take 0.5 tablets (12.5 mg total) by mouth daily. 45 tablet 3   torsemide (DEMADEX) 10 MG tablet Take 10 mg by mouth as needed for fluid.     atorvastatin (LIPITOR) 20 MG tablet Take 1 tablet (20 mg total) by mouth daily. 90 tablet 3   No current facility-administered medications for this encounter.   BP 102/60   Pulse (!) 50   Wt 112.8 kg (248 lb 9.6 oz)   SpO2 98%   BMI 32.80 kg/m  General: NAD Neck: No JVD, no thyromegaly or thyroid nodule.  Lungs: Clear to auscultation bilaterally with normal respiratory effort. CV: Nondisplaced PMI.  Heart regular S1/S2, no S3/S4, no murmur.  No peripheral edema.  No carotid bruit.  Normal pedal pulses.  Abdomen: Soft, nontender, no hepatosplenomegaly, no distention.  Skin: Intact without lesions or rashes.  Neurologic: Alert and oriented x 3.  Psych: Normal affect. Extremities: No clubbing or cyanosis.  HEENT: Normal.   Assessment/Plan: 1. Chronic systolic CHF: Echo in 7/89 with EF 25-30%, echo in 3/21 with EF 30-35%.  Cardiolite with inferior scar vs artifact, no ischemia. Cath in 10/21 with nonobstructive CAD.  Cause of cardiomyopathy is uncertain.  Could be related to atrial fibrillation (though not generally in RVR), cannot rule out cardiomyopathy related to myocarditis  (?COVID-19 myocarditis).  Repeat echo in 6/21 showed EF up to 40-45%.  TEE in 8/21 with EF 35-40%.  TEE in 3/22 with EF 30-35% and moderate RV dysfunction.  Echo was done to day and reviewed, EF 40-45% (some improvement).  NYHA class II symptoms, he is not volume overloaded on exam. Medication titration has been limited by soft blood pressure and CKD.  - Continue Coreg 12.5 mg bid.  - Continue Entresto 24/26 bid. BMET today.   - Continue digoxin, check level today.    - Continue spironolactone 12.5 daily, will not increase with borderline high K.  - Continue dapagliflozin 10 mg daily.   - EF is out of range for ICD.  Not CRT candidate with narrow QRS.    2. Atrial fibrillation:  DCCV x 2 on Tikosyn and DCCV x 1 on amiodarone.  He finally had atrial fibrillation ablation in 3/22 and is now in NSR.  He is off amiodarone.  - Continue Eliquis.  3. CKD: Stage 3.  BMET today. 4. Hyperlipidemia: Nonobstructive CAD, would like to see LDL < 70.  - Increase atorvastatin to 20 mg daily, lipids/LFTs in 2 months.   Followup 4 months.   [name] 05/08/2021

## 2021-05-08 NOTE — Patient Instructions (Addendum)
EKG done today.   Labs done today. We will contact you only if your labs are abnormal.  INCREASE Atorvastatin 20mg  (1 tablet) by mouth daily.   No other medication changes were made. Please continue all current medications as prescribed.  Your physician recommends that you schedule a follow-up appointment in: 2 months for a lab only appointment and in 4 months with Dr.McLean.  If you have any questions or concerns before your next appointment please send [place] a message through [place] or call our office at [phone].    TO LEAVE A MESSAGE FOR THE NURSE SELECT OPTION 2, PLEASE LEAVE A MESSAGE INCLUDING: YOUR NAME DATE OF BIRTH CALL BACK NUMBER REASON FOR CALL**this is important as we prioritize the call backs  YOU WILL RECEIVE A CALL BACK THE SAME DAY AS LONG AS YOU CALL BEFORE 4:00 PM   Do the following things EVERYDAY: Weigh yourself in the morning before breakfast. Write it down and keep it in a log. Take your medicines as prescribed Eat low salt foods--Limit salt (sodium) to 2000 mg per day.  Stay as active as you can everyday Limit all fluids for the day to less than 2 liters   At the [facility], you and your health needs are our priority. As part of our continuing mission to provide you with exceptional heart care, we have created designated Provider Care Teams. These Care Teams include your primary Cardiologist (physician) and Advanced Practice Providers (APPs- Physician Assistants and Nurse Practitioners) who all work together to provide you with the care you need, when you need it.   You may see any of the following providers on your designated Care Team at your next follow up: Dr Glori Bickers Dr Haynes Kerns, NP Lyda Jester, [place] Audry Riles, PharmD   Please be sure to bring in all your medications bottles to every appointment.

## 2021-05-08 NOTE — Progress Notes (Signed)
  Echocardiogram 2D Echocardiogram has been performed.  Michiel Cowboy 05/08/2021, 8:37 AM

## 2021-05-19 ENCOUNTER — Ambulatory Visit: Payer: Commercial Managed Care - PPO | Admitting: Neurology

## 2021-07-10 ENCOUNTER — Other Ambulatory Visit (HOSPITAL_COMMUNITY): Payer: Commercial Managed Care - PPO

## 2021-07-17 ENCOUNTER — Other Ambulatory Visit: Payer: Self-pay

## 2021-07-17 ENCOUNTER — Ambulatory Visit (INDEPENDENT_AMBULATORY_CARE_PROVIDER_SITE_OTHER): Payer: Commercial Managed Care - PPO | Admitting: Internal Medicine

## 2021-07-17 ENCOUNTER — Ambulatory Visit (HOSPITAL_COMMUNITY)
Admission: RE | Admit: 2021-07-17 | Discharge: 2021-07-17 | Disposition: A | Discharge status: Home / Self Care | Payer: Commercial Managed Care - PPO | Source: Ambulatory Visit | Attending: Cardiology | Admitting: Cardiology

## 2021-07-17 VITALS — BP 112/68 | HR 58 | Ht 73.0 in | Wt 252.0 lb

## 2021-07-17 DIAGNOSIS — I4819 Other persistent atrial fibrillation: Secondary | ICD-10-CM

## 2021-07-17 DIAGNOSIS — I519 Heart disease, unspecified: Secondary | ICD-10-CM | POA: Diagnosis not present

## 2021-07-17 DIAGNOSIS — I5042 Chronic combined systolic (congestive) and diastolic (congestive) heart failure: Secondary | ICD-10-CM | POA: Insufficient documentation

## 2021-07-17 DIAGNOSIS — I428 Other cardiomyopathies: Secondary | ICD-10-CM

## 2021-07-17 LAB — LIPID PANEL
Cholesterol: 128 mg/dL (ref 0–200)
HDL: 49 mg/dL (ref >40)
LDL Cholesterol: 68 mg/dL (ref 0–99)
Total CHOL/HDL Ratio: 2.6 RATIO
Triglycerides: 54 mg/dL (ref <150)
VLDL: 11 mg/dL (ref 0–40)

## 2021-07-17 LAB — HEPATIC FUNCTION PANEL
ALT: 17 U/L (ref 0–44)
AST: 20 U/L (ref 15–41)
Albumin: 3.5 g/dL (ref 3.5–5.0)
Alkaline Phosphatase: 69 U/L (ref 38–126)
Bilirubin, Direct: 0.3 mg/dL — ABNORMAL HIGH (ref 0.0–0.2)
Indirect Bilirubin: 1 mg/dL — ABNORMAL HIGH (ref 0.3–0.9)
Total Bilirubin: 1.3 mg/dL — ABNORMAL HIGH (ref 0.3–1.2)
Total Protein: 6 g/dL — ABNORMAL LOW (ref 6.5–8.1)

## 2021-07-17 LAB — BASIC METABOLIC PANEL
Anion gap: 6 (ref 5–15)
BUN: 25 mg/dL — ABNORMAL HIGH (ref 8–23)
CO2: 24 mmol/L (ref 22–32)
Calcium: 8.6 mg/dL — ABNORMAL LOW (ref 8.9–10.3)
Chloride: 107 mmol/L (ref 98–111)
Creatinine, Ser: 2.06 mg/dL — ABNORMAL HIGH (ref 0.61–1.24)
GFR, Estimated: 33 mL/min — ABNORMAL LOW (ref >60)
Glucose, Bld: 111 mg/dL — ABNORMAL HIGH (ref 70–99)
Potassium: 4.5 mmol/L (ref 3.5–5.1)
Sodium: 137 mmol/L (ref 135–145)

## 2021-07-17 NOTE — Progress Notes (Signed)
PCP: [name], MD Primary Cardiologist: Dr Aundra Dubin Primary EP: Dr Hassell Halim is a 75 y.o. male who presents today for routine electrophysiology followup.  Since last being seen in our clinic, the patient reports doing very well.  Today, he denies symptoms of palpitations, chest pain, shortness of breath,  lower extremity edema, dizziness, presyncope, or syncope.  The patient is otherwise without complaint today.   Past Medical History:  Diagnosis Date   CHF (congestive heart failure) (HCC)    Chronic combined systolic and diastolic heart failure ([place]) 12/13/2019   Chronic kidney disease    CKD (chronic kidney disease) stage 3, GFR 30-59 ml/min ([place]) 12/13/2019   Diabetes mellitus ([place])    Gastric ulcer 12/13/2019   +H. Pylori. Bleeding required clipping.   H/O blood clots    History of kidney cancer    Persistent atrial fibrillation ([place]) 12/13/2019   Renal cell carcinoma ([place]) 12/13/2019   S/p nephrectomy   Past Surgical History:  Procedure Laterality Date   APPENDECTOMY     ATRIAL FIBRILLATION ABLATION N/A 04/07/2020   Procedure: ATRIAL FIBRILLATION ABLATION;  Surgeon: [name], MD;  Location: [place] [facility];  Service: Cardiovascular;  Laterality: N/A;   ATRIAL FIBRILLATION ABLATION N/A 12/06/2020   Procedure: ATRIAL FIBRILLATION ABLATION;  Surgeon: [name], MD;  Location: [place] [facility];  Service: Cardiovascular;  Laterality: N/A;   BIOPSY  11/19/2019   Procedure: BIOPSY;  Surgeon: Ronald Lobo, MD;  Location: [place];  Service: Endoscopy;;   CARDIOVERSION N/A 12/17/2019   Procedure: CARDIOVERSION;  Surgeon: Geralynn Rile, MD;  Location: [place];  Service: Cardiovascular;  Laterality: N/A;   CARDIOVERSION N/A 02/18/2020   Procedure: CARDIOVERSION;  Surgeon: Jerline Pain, MD;  Location: [facility] ENDOSCOPY;  Service: Cardiovascular;  Laterality: N/A;   CARDIOVERSION N/A 02/29/2020   Procedure: CARDIOVERSION;  Surgeon: Larey Dresser, MD;   Location: [facility] ENDOSCOPY;  Service: Cardiovascular;  Laterality: N/A;   CARDIOVERSION N/A 06/06/2020   Procedure: CARDIOVERSION;  Surgeon: Larey Dresser, MD;  Location: [facility] ENDOSCOPY;  Service: Cardiovascular;  Laterality: N/A;   ESOPHAGOGASTRODUODENOSCOPY N/A 11/19/2019   Procedure: ESOPHAGOGASTRODUODENOSCOPY (EGD);  Surgeon: Ronald Lobo, MD;  Location: [facility] ENDOSCOPY;  Service: Endoscopy;  Laterality: N/A;   HEMOSTASIS CLIP PLACEMENT  11/19/2019   Procedure: HEMOSTASIS CLIP PLACEMENT;  Surgeon: Ronald Lobo, MD;  Location: [place];  Service: Endoscopy;;   NEPHRECTOMY     RIGHT/LEFT HEART CATH AND CORONARY ANGIOGRAPHY N/A 07/21/2020   Procedure: RIGHT/LEFT HEART CATH AND CORONARY ANGIOGRAPHY;  Surgeon: Larey Dresser, MD;  Location: [place] [facility];  Service: Cardiovascular;  Laterality: N/A;   TEE WITHOUT CARDIOVERSION N/A 05/18/2020   Procedure: TRANSESOPHAGEAL ECHOCARDIOGRAM (TEE);  Surgeon: Larey Dresser, MD;  Location: [facility] ENDOSCOPY;  Service: Cardiovascular;  Laterality: N/A;   TEE WITHOUT CARDIOVERSION N/A 12/05/2020   Procedure: TRANSESOPHAGEAL ECHOCARDIOGRAM (TEE);  Surgeon: Fay Records, MD;  Location: [facility] ENDOSCOPY;  Service: Cardiovascular;  Laterality: N/A;    ROS- all systems are reviewed and negatives except as per HPI above  Current Outpatient Medications  Medication Sig Dispense Refill   ACCU-CHEK GUIDE test strip daily. for testing     Accu-Chek Softclix Lancets lancets      acetaminophen (TYLENOL) 500 MG tablet Take 1,000 mg by mouth every 6 (six) hours as needed for mild pain or headache.     apixaban (ELIQUIS) 5 MG TABS tablet Take 1 tablet (5 mg total) by mouth 2 (two) times  daily. 180 tablet 3   ascorbic acid (VITAMIN C) 1000 MG tablet Take 1,000 mg by mouth every evening.      atorvastatin (LIPITOR) 20 MG tablet Take 1 tablet (20 mg total) by mouth daily. 90 tablet 3   Blood Glucose Monitoring Suppl (ACCU-CHEK GUIDE ME) w/Device KIT See admin  instructions.     carvedilol (COREG) 12.5 MG tablet Take 1 tablet (12.5 mg total) by mouth in the morning and at bedtime. 180 tablet 3   dapagliflozin propanediol (FARXIGA) 10 MG TABS tablet Take 1 tablet (10 mg total) by mouth daily before breakfast. 90 tablet 3   digoxin (LANOXIN) 0.125 MG tablet Take 0.5 tablets (0.0625 mg total) by mouth daily. 45 tablet 3   Garlic 5625 MG CAPS Take 1,000 mg by mouth every evening.     Glucosamine HCl 1000 MG TABS Take 1,000 mg by mouth every evening.     Multiple Vitamin (MULTIVITAMIN WITH MINERALS) TABS tablet Take 1 tablet by mouth daily with breakfast.      Omega-3 Fatty Acids (FISH OIL PO) Take 350 mg by mouth every evening.      sacubitril-valsartan (ENTRESTO) 24-26 MG TAKE 1 TABLET BY MOUTH 2 (TWO) TIMES DAILY. 180 tablet 3   Saw Palmetto, Serenoa repens, (SAW PALMETTO PO) Take 540 mg by mouth every evening.      spironolactone (ALDACTONE) 25 MG tablet Take 0.5 tablets (12.5 mg total) by mouth daily. 45 tablet 3   torsemide (DEMADEX) 10 MG tablet Take 10 mg by mouth as needed for fluid.     No current facility-administered medications for this visit.    Physical Exam: Vitals:   07/17/21 1602  BP: 112/68  Pulse: (!) 58  SpO2: 96%  Weight: 252 lb (114.3 kg)  Height: 6' 1"  (1.854 m)    GEN- The patient is well appearing, alert and oriented x 3 today.   Head- normocephalic, atraumatic Eyes-  Sclera clear, conjunctiva pink Ears- hearing intact Oropharynx- clear Lungs- Clear to ausculation bilaterally, normal work of breathing Heart- Regular rate and rhythm, no murmurs, rubs or gallops, PMI not laterally displaced GI- soft, NT, ND, + BS Extremities- no clubbing, cyanosis, or edema  Wt Readings from Last 3 Encounters:  07/17/21 252 lb (114.3 kg)  05/08/21 248 lb 9.6 oz (112.8 kg)  03/15/21 246 lb (111.6 kg)    EKG tracing ordered today is personally reviewed and shows sinus rhythm, PVCs, left axis deviation  Assessment and  Plan:  Persistent afib Doing well post ablation off AAD therapy Chads2vasc score is 3.  He is on eliquis  2. Nonischemic CM EF has improved to 40-45% with sinus rhythm  Risks, benefits and potential toxicities for medications prescribed and/or refilled reviewed with patient today.   Follow-up with Dr Aundra Dubin as scheduled Return to AF clinic in 6 months  [name] MD, [facility] 07/17/2021 4:19 PM

## 2021-07-17 NOTE — Patient Instructions (Addendum)
Medication Instructions:  Your physician recommends that you continue on your current medications as directed. Please refer to the Current Medication list given to you today. *If you need a refill on your cardiac medications before your next appointment, please call your pharmacy*  Lab Work: None. If you have labs (blood work) drawn today and your tests are completely normal, you will receive your results only by: [place] (if you have MyChart) OR A paper copy in the mail If you have any lab test that is abnormal or we need to change your treatment, we will call you to review the results.  Testing/Procedures: None.  Follow-Up: At [facility], you and your health needs are our priority.  As part of our continuing mission to provide you with exceptional heart care, we have created designated Provider Care Teams.  These Care Teams include your primary Cardiologist (physician) and Advanced Practice Providers (APPs -  Physician Assistants and Nurse Practitioners) who all work together to provide you with the care you need, when you need it.  Your physician wants you to follow-up in:  Afib Clinic in 6 months. They will contact you to schedule.   We recommend signing up for the patient portal called "MyChart".  Sign up information is provided on this After Visit Summary.  MyChart is used to connect with patients for Virtual Visits (Telemedicine).  Patients are able to view lab/test results, encounter notes, upcoming appointments, etc.  Non-urgent messages can be sent to your provider as well.   To learn more about what you can do with MyChart, go to [email or web address].    Any Other Special Instructions Will Be Listed Below (If Applicable).

## 2021-07-20 ENCOUNTER — Other Ambulatory Visit (HOSPITAL_COMMUNITY): Payer: Self-pay | Admitting: Cardiology

## 2021-09-08 ENCOUNTER — Ambulatory Visit (HOSPITAL_COMMUNITY)
Admission: RE | Admit: 2021-09-08 | Discharge: 2021-09-08 | Disposition: A | Discharge status: Home / Self Care | Payer: Commercial Managed Care - PPO | Source: Ambulatory Visit | Attending: Cardiology | Admitting: Cardiology

## 2021-09-08 VITALS — BP 115/60 | HR 71 | Wt 257.6 lb

## 2021-09-08 DIAGNOSIS — Z7901 Long term (current) use of anticoagulants: Secondary | ICD-10-CM | POA: Insufficient documentation

## 2021-09-08 DIAGNOSIS — N183 Chronic kidney disease, stage 3 unspecified: Secondary | ICD-10-CM | POA: Insufficient documentation

## 2021-09-08 DIAGNOSIS — Z8616 Personal history of COVID-19: Secondary | ICD-10-CM | POA: Diagnosis not present

## 2021-09-08 DIAGNOSIS — Z87891 Personal history of nicotine dependence: Secondary | ICD-10-CM | POA: Insufficient documentation

## 2021-09-08 DIAGNOSIS — E785 Hyperlipidemia, unspecified: Secondary | ICD-10-CM | POA: Diagnosis not present

## 2021-09-08 DIAGNOSIS — I5042 Chronic combined systolic (congestive) and diastolic (congestive) heart failure: Secondary | ICD-10-CM | POA: Diagnosis present

## 2021-09-08 DIAGNOSIS — E1122 Type 2 diabetes mellitus with diabetic chronic kidney disease: Secondary | ICD-10-CM | POA: Diagnosis not present

## 2021-09-08 DIAGNOSIS — Z7984 Long term (current) use of oral hypoglycemic drugs: Secondary | ICD-10-CM | POA: Insufficient documentation

## 2021-09-08 DIAGNOSIS — Z86718 Personal history of other venous thrombosis and embolism: Secondary | ICD-10-CM | POA: Diagnosis not present

## 2021-09-08 DIAGNOSIS — Z09 Encounter for follow-up examination after completed treatment for conditions other than malignant neoplasm: Secondary | ICD-10-CM | POA: Diagnosis not present

## 2021-09-08 DIAGNOSIS — I480 Paroxysmal atrial fibrillation: Secondary | ICD-10-CM | POA: Diagnosis not present

## 2021-09-08 DIAGNOSIS — I2510 Atherosclerotic heart disease of native coronary artery without angina pectoris: Secondary | ICD-10-CM | POA: Diagnosis not present

## 2021-09-08 LAB — BASIC METABOLIC PANEL
Anion gap: 5 (ref 5–15)
BUN: 27 mg/dL — ABNORMAL HIGH (ref 8–23)
CO2: 23 mmol/L (ref 22–32)
Calcium: 8.4 mg/dL — ABNORMAL LOW (ref 8.9–10.3)
Chloride: 108 mmol/L (ref 98–111)
Creatinine, Ser: 2.33 mg/dL — ABNORMAL HIGH (ref 0.61–1.24)
GFR, Estimated: 29 mL/min — ABNORMAL LOW (ref >60)
Glucose, Bld: 110 mg/dL — ABNORMAL HIGH (ref 70–99)
Potassium: 4.8 mmol/L (ref 3.5–5.1)
Sodium: 136 mmol/L (ref 135–145)

## 2021-09-08 LAB — BRAIN NATRIURETIC PEPTIDE: B Natriuretic Peptide: 1011.8 pg/mL — ABNORMAL HIGH (ref 0.0–100.0)

## 2021-09-08 MED ORDER — SPIRONOLACTONE 25 MG PO TABS: 25.0000 mg | ORAL_TABLET | Freq: Every day | ORAL | 3 refills | Status: DC

## 2021-09-08 NOTE — Patient Instructions (Signed)
Labs done today. We will contact you only if your labs are abnormal.  STOP taking Digoxin  INCREASE Spironolactone to 25mg  (1 tablet) by mouth daily.   No medication changes were made. Please continue all current medications as prescribed.  Your physician recommends that you schedule a follow-up appointment in: 10 days for a lab only appointment and in 4 months with Dr. Aundra Dubin. Please contact our office in February 2023 to schedule a April 2023 appointment.   If you have any questions or concerns before your next appointment please send [place] a message through [place] or call our office at [phone].    TO LEAVE A MESSAGE FOR THE NURSE SELECT OPTION 2, PLEASE LEAVE A MESSAGE INCLUDING: YOUR NAME DATE OF BIRTH CALL BACK NUMBER REASON FOR CALL**this is important as we prioritize the call backs  YOU WILL RECEIVE A CALL BACK THE SAME DAY AS LONG AS YOU CALL BEFORE 4:00 PM   Do the following things EVERYDAY: Weigh yourself in the morning before breakfast. Write it down and keep it in a log. Take your medicines as prescribed Eat low salt foods--Limit salt (sodium) to 2000 mg per day.  Stay as active as you can everyday Limit all fluids for the day to less than 2 liters   At the [facility], you and your health needs are our priority. As part of our continuing mission to provide you with exceptional heart care, we have created designated Provider Care Teams. These Care Teams include your primary Cardiologist (physician) and Advanced Practice Providers (APPs- Physician Assistants and Nurse Practitioners) who all work together to provide you with the care you need, when you need it.   You may see any of the following providers on your designated Care Team at your next follow up: Dr Glori Bickers Dr Haynes Kerns, NP Lyda Jester, [place] Audry Riles, PharmD   Please be sure to bring in all your medications bottles to every appointment.

## 2021-09-10 NOTE — Progress Notes (Signed)
PCP: [name], MD Cardiology: Dr. Oval Linsey EP: Dr. Rayann Heman HF Cardiology: Dr. Aundra Dubin  75 y.o. with history of COVID-19 PNA, atrial fibrillation, and CHF was referred by Dr. Oval Linsey for evaluation of CHF.  Patient has history of renal cell carcinoma with right nephrectomy and diabetes, has CKD stage 3 as well.  He was feeling good until 1/21, when he developed COVID-19 PNA.  He was not hospitalized, but had a severe bout with it.  In 2/21, he was admitted with GI bleeding from gastric ulcers and new atrial fibrillation. He had EGD with clipping of ulcers.  Eventually, he was startedon Eliquis and had DCCV to NSR.  He additionally was noted to have RLE DVT in 2/21.  Echo in 2/21 hospitalization showed EF 25-30%.  Lexiscan Cardiolite showed inferior scar versus artifact and no ischemia.  Repeat echo in 3/21 showed EF still 30-35%.   He had ERAF after 3/21 DCCV and was re-admitted in 5/21 for Tikosyn initiation.  He had repeat DCCV to NSR this admission.  He was only on Tikosyn 125 mcg bid at time of discharge.  He went back into atrial fibrillation again and I cardioverted him to NSR later in 5/21.   Echo in 6/21 showed improved EF 40-45%, RV normal.    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  Tikosyn was stopped, Coreg was increased to 12.5 mg bid.    TEE in 8/21 showed EF 35-40%, global HK, mildly decreased RV function, no LAA thrombus.  Patient was cardioverted to NSR.   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    In 3/22, he had atrial fibrillation ablation. TEE at that time showed EF 30-35%, moderate RV dysfunction.   Echo in 8/22 showed EF 40-45%, diffuse hypokinesis, normal RV.   He returns for followup of CHF and atrial fibrillation.  He is in NSR today.  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  No exertional dyspnea  or chest pain.  No lightheadedness.  Weight is up, per his wife he is eating more.  No orthopnea/PND.   Labs (5/21): K 4.5, creatinine 1.73 => 1.9, digoxin 0.9 Labs (6/21): K 5.2, creatinine 1.76 Labs (8/21): K 4.8, creatinine 1.9 Labs (5/22): digoxin 0.6, K 4.5, creatinine 2.09, hgb 14.2, LDL 94, BNP 461 Labs (7/22): K 4.8, creatinine 2.03 Labs (10/22): K 4.5, creatinine 2.06  PMH: 1. Atrial fibrillation: Paroxysmal.  - DCCV to NSR in 3/21, ERAF.   - Admitted in 5/21 for Tikosyn intiation and repeat DCCV to NSR.  - LAA thrombus in 7/21, atrial fibrillation ablation not done and Tikosyn stopped.  - Amiodarone started, repeat TEE in 8/21 with no thrombus and DCCV to NSR.  - Atrial fibrillation ablation 3/22 2. 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 Renal cell carcinoma s/p right nephrectomy 4. CKD: Stage 3. 5. Hyperlipidemia.  6. H/o COVID-19 PNA in 1/21.  7. H/o GI bleeding from gastric ulcers in 3/21.  8. Cardiomyopathy: Echo (2/21) with EF 25-30%.  - Cardiolite (2/21) with inferior scar, no ischemia.  - Echo (3/21): EF 30-35%, global hypokinesis, mild MR.  - Echo (6/21): EF 40-45%, RV normal size and systolic function, PASP 36, IVC dilated.  - TEE (8/21): EF 35-40%, diffuse hypokinesis, mildly decreased RV systolic function.  - LHC/RHC (10/21): 40% D2 stenosis, 40% PDA stenosis; mean RA 18,  PA 51/23 mean 32, mean PCWP 22, CI 2.2 Thermo/1.9 Fick, PVR 1.9 WU.  - TEE (2/22): EF 30-35%, diffuse hypokinesis, moderately decreased RV systolic function.  - Echo (8/22): EF 40-45%, diffuse hypokinesis, normal RV.  9. RLE DVT in 2/21 post-COVID-19 PNA.   Social History   Socioeconomic History   Marital status: Married    Spouse name: Not on file   Number of children: Not on file   Years of education: Not on file   Highest education level: Not on file  Occupational History   Occupation: truck driver  Tobacco Use   Smoking status: Former    Packs/day: 2.00    Years: 35.00     Pack years: 70.00    Types: Cigarettes    Quit date: 2000    Years since quitting: 22.9   Smokeless tobacco: Never  Vaping Use   Vaping Use: Never used  Substance and Sexual Activity   Alcohol use: Yes    Alcohol/week: 1.0 - 2.0 standard drink    Types: 1 - 2 Cans of beer per week    Comment: Socially   Drug use: No   Sexual activity: Not on file  Other Topics Concern   Not on file  Social History Narrative   Lives with wife in a one story home.  Has 3 children.     Works as a Administrator.     Education: 12th grade.   Social Determinants of Health   Financial Resource Strain: Not on file  Food Insecurity: Not on file  Transportation Needs: Not on file  Physical Activity: Not on file  Stress: Not on file  Social Connections: Not on file  Intimate Partner Violence: Not on file   Family History  Problem Relation Age of Onset   Cancer Mother    Heart attack Father    Heart attack Brother    Heart disease Sister    Stroke Paternal Grandmother    ROS: All systems reviewed and negative except as per HPI.   Current Outpatient Medications  Medication Sig Dispense Refill   ACCU-CHEK GUIDE test strip daily. for testing     Accu-Chek Softclix Lancets lancets      acetaminophen (TYLENOL) 500 MG tablet Take 1,000 mg by mouth every 6 (six) hours as needed for mild pain or headache.     apixaban (ELIQUIS) 5 MG TABS tablet Take 1 tablet (5 mg total) by mouth 2 (two) times daily. 180 tablet 3   ascorbic acid (VITAMIN C) 1000 MG tablet Take 1,000 mg by mouth every evening.      atorvastatin (LIPITOR) 20 MG tablet Take 1 tablet (20 mg total) by mouth daily. 90 tablet 3   Blood Glucose Monitoring Suppl (ACCU-CHEK GUIDE ME) w/Device KIT See admin instructions.     carvedilol (COREG) 12.5 MG tablet Take 1 tablet (12.5 mg total) by mouth in the morning and at bedtime. 180 tablet 3   dapagliflozin propanediol (FARXIGA) 10 MG TABS tablet Take 1 tablet (10 mg total) by mouth daily before  breakfast. 90 tablet 3   Garlic 5366 MG CAPS Take 1,000 mg by mouth every evening.     Glucosamine HCl 1000 MG TABS Take 1,000 mg by mouth every evening.     Multiple Vitamin (MULTIVITAMIN WITH MINERALS) TABS tablet Take 1 tablet by mouth daily with breakfast.      Omega-3 Fatty Acids (FISH OIL PO) Take 350 mg by mouth every evening.      sacubitril-valsartan (ENTRESTO)  24-26 MG TAKE 1 TABLET BY MOUTH 2 (TWO) TIMES DAILY. 180 tablet 3   Saw Palmetto, Serenoa repens, (SAW PALMETTO PO) Take 540 mg by mouth every evening.      torsemide (DEMADEX) 10 MG tablet Take 10 mg by mouth as needed for fluid.     spironolactone (ALDACTONE) 25 MG tablet Take 1 tablet (25 mg total) by mouth daily. 90 tablet 3   No current facility-administered medications for this encounter.   BP 115/60   Pulse 71   Wt 116.8 kg (257 lb 9.6 oz)   SpO2 96%   BMI 33.99 kg/m  General: NAD Neck: No JVD, no thyromegaly or thyroid nodule.  Lungs: Clear to auscultation bilaterally with normal respiratory effort. CV: Nondisplaced PMI.  Heart regular S1/S2, no S3/S4, no murmur.  1+ ankle edema.  No carotid bruit.  Normal pedal pulses.  Abdomen: Soft, nontender, no hepatosplenomegaly, no distention.  Skin: Intact without lesions or rashes.  Neurologic: Alert and oriented x 3.  Psych: Normal affect. Extremities: No clubbing or cyanosis.  HEENT: Normal.   Assessment/Plan: 1. Chronic systolic CHF: Echo in 0/93 with EF 25-30%, echo in 3/21 with EF 30-35%.  Cardiolite with inferior scar vs artifact, no ischemia. Cath in 10/21 with nonobstructive CAD.  Cause of cardiomyopathy is uncertain.  Could be related to atrial fibrillation (though not generally in RVR), cannot rule out cardiomyopathy related to myocarditis (?COVID-19 myocarditis).  Repeat echo in 6/21 showed EF up to 40-45%.  TEE in 8/21 with EF 35-40%.  TEE in 3/22 with EF 30-35% and moderate RV dysfunction.  Echo in 8/22 showed EF 40-45%.  NYHA class II symptoms, he is not  volume overloaded on exam. Medication titration has been limited by soft blood pressure and CKD.  - Continue Coreg 12.5 mg bid.  - Continue Entresto 24/26 bid. BMET today.   - I think that we can stop digoxin.  - Increase spironolactone to 25 mg daily, BMET today and again in 10 days.   - Continue dapagliflozin 10 mg daily.   - EF is out of range for ICD.  Not CRT candidate with narrow QRS.    2. Atrial fibrillation:  DCCV x 2 on Tikosyn and DCCV x 1 on amiodarone.  He finally had atrial fibrillation ablation in 3/22 and is now in NSR.  He is off amiodarone.  - Continue Eliquis.  3. CKD: Stage 3.  BMET today. 4. Hyperlipidemia: Nonobstructive CAD, good lipids in 10/22.  - Continue atorvastatin.   Followup 4 months.   Melvin Ramirez 09/10/2021

## 2021-09-18 ENCOUNTER — Ambulatory Visit (HOSPITAL_COMMUNITY)
Admission: RE | Admit: 2021-09-18 | Discharge: 2021-09-18 | Disposition: A | Discharge status: Home / Self Care | Payer: Commercial Managed Care - PPO | Source: Ambulatory Visit | Attending: Internal Medicine | Admitting: Internal Medicine

## 2021-09-18 ENCOUNTER — Other Ambulatory Visit: Payer: Self-pay

## 2021-09-18 DIAGNOSIS — I5042 Chronic combined systolic (congestive) and diastolic (congestive) heart failure: Secondary | ICD-10-CM | POA: Insufficient documentation

## 2021-09-18 LAB — BASIC METABOLIC PANEL
Anion gap: 8 (ref 5–15)
BUN: 27 mg/dL — ABNORMAL HIGH (ref 8–23)
CO2: 24 mmol/L (ref 22–32)
Calcium: 9.1 mg/dL (ref 8.9–10.3)
Chloride: 107 mmol/L (ref 98–111)
Creatinine, Ser: 2.66 mg/dL — ABNORMAL HIGH (ref 0.61–1.24)
GFR, Estimated: 25 mL/min — ABNORMAL LOW (ref >60)
Glucose, Bld: 97 mg/dL (ref 70–99)
Potassium: 4.7 mmol/L (ref 3.5–5.1)
Sodium: 139 mmol/L (ref 135–145)

## 2021-09-19 ENCOUNTER — Telehealth (HOSPITAL_COMMUNITY): Payer: Self-pay | Admitting: Surgery

## 2021-09-19 DIAGNOSIS — I5042 Chronic combined systolic (congestive) and diastolic (congestive) heart failure: Secondary | ICD-10-CM

## 2021-09-19 NOTE — Telephone Encounter (Signed)
Patient called and wife given results and recommendations.  She tells me that he is not using Torsemide at all.  Appt scheduled for repeat labwork for next Tuesday Dec 20th.

## 2021-09-19 NOTE — Telephone Encounter (Signed)
-----   Message from Larey Dresser, MD sent at 09/18/2021  9:57 PM EST ----- Increase fluid intake and make sure he is not using torsemide.  Needs repeat BMET 1 week to make sure that creatinine stabilizes.

## 2021-09-26 ENCOUNTER — Ambulatory Visit (HOSPITAL_COMMUNITY)
Admission: RE | Admit: 2021-09-26 | Discharge: 2021-09-26 | Disposition: A | Discharge status: Home / Self Care | Payer: Commercial Managed Care - PPO | Source: Ambulatory Visit | Attending: Internal Medicine | Admitting: Internal Medicine

## 2021-09-26 ENCOUNTER — Other Ambulatory Visit: Payer: Self-pay

## 2021-09-26 DIAGNOSIS — I5042 Chronic combined systolic (congestive) and diastolic (congestive) heart failure: Secondary | ICD-10-CM | POA: Diagnosis present

## 2021-09-26 LAB — BASIC METABOLIC PANEL
Anion gap: 5 (ref 5–15)
BUN: 29 mg/dL — ABNORMAL HIGH (ref 8–23)
CO2: 23 mmol/L (ref 22–32)
Calcium: 8.7 mg/dL — ABNORMAL LOW (ref 8.9–10.3)
Chloride: 107 mmol/L (ref 98–111)
Creatinine, Ser: 2.45 mg/dL — ABNORMAL HIGH (ref 0.61–1.24)
GFR, Estimated: 27 mL/min — ABNORMAL LOW (ref >60)
Glucose, Bld: 126 mg/dL — ABNORMAL HIGH (ref 70–99)
Potassium: 4.9 mmol/L (ref 3.5–5.1)
Sodium: 135 mmol/L (ref 135–145)

## 2021-10-04 ENCOUNTER — Other Ambulatory Visit (HOSPITAL_COMMUNITY): Payer: Self-pay | Admitting: Cardiology

## 2021-10-16 ENCOUNTER — Other Ambulatory Visit (HOSPITAL_COMMUNITY): Payer: Self-pay | Admitting: Cardiology

## 2021-10-23 ENCOUNTER — Other Ambulatory Visit (HOSPITAL_COMMUNITY): Payer: Self-pay

## 2021-10-23 ENCOUNTER — Telehealth (HOSPITAL_COMMUNITY): Payer: Self-pay | Admitting: Pharmacy Technician

## 2021-10-23 NOTE — Telephone Encounter (Signed)
Advanced Heart Failure Patient Advocate Encounter  Patient called and left message stating that he needed assistance with the [facility] co-pay. Called and spoke with the patient's wife, she stated they were able to get a copay card. She is worried that when he stops working he will come off the [employer] and the Medicare copays will be too expensive. I advised her to call back and speak with me when that happens. We will assess copay affordability and assistance options at that time. Reminded her that they are able to get copay cards for entresto, farxiga and eliquis.  Charlann Boxer, CPhT

## 2021-11-08 ENCOUNTER — Other Ambulatory Visit (HOSPITAL_COMMUNITY): Payer: Self-pay | Admitting: Cardiology

## 2021-11-10 ENCOUNTER — Other Ambulatory Visit (HOSPITAL_COMMUNITY): Payer: Self-pay | Admitting: Cardiology

## 2022-02-19 DIAGNOSIS — E7800 Pure hypercholesterolemia, unspecified: Secondary | ICD-10-CM | POA: Diagnosis not present

## 2022-02-19 DIAGNOSIS — I82431 Acute embolism and thrombosis of right popliteal vein: Secondary | ICD-10-CM | POA: Diagnosis not present

## 2022-02-19 DIAGNOSIS — D696 Thrombocytopenia, unspecified: Secondary | ICD-10-CM | POA: Diagnosis not present

## 2022-02-19 DIAGNOSIS — K219 Gastro-esophageal reflux disease without esophagitis: Secondary | ICD-10-CM | POA: Diagnosis not present

## 2022-02-19 DIAGNOSIS — N529 Male erectile dysfunction, unspecified: Secondary | ICD-10-CM | POA: Diagnosis not present

## 2022-02-19 DIAGNOSIS — Z7984 Long term (current) use of oral hypoglycemic drugs: Secondary | ICD-10-CM | POA: Diagnosis not present

## 2022-02-19 DIAGNOSIS — E1169 Type 2 diabetes mellitus with other specified complication: Secondary | ICD-10-CM | POA: Diagnosis not present

## 2022-02-19 DIAGNOSIS — Z8616 Personal history of COVID-19: Secondary | ICD-10-CM | POA: Diagnosis not present

## 2022-02-19 DIAGNOSIS — N1832 Chronic kidney disease, stage 3b: Secondary | ICD-10-CM | POA: Diagnosis not present

## 2022-02-19 DIAGNOSIS — I5022 Chronic systolic (congestive) heart failure: Secondary | ICD-10-CM | POA: Diagnosis not present

## 2022-02-19 DIAGNOSIS — D6869 Other thrombophilia: Secondary | ICD-10-CM | POA: Diagnosis not present

## 2022-02-19 DIAGNOSIS — I4891 Unspecified atrial fibrillation: Secondary | ICD-10-CM | POA: Diagnosis not present

## 2022-04-17 ENCOUNTER — Ambulatory Visit (HOSPITAL_COMMUNITY)
Admission: RE | Admit: 2022-04-17 | Discharge: 2022-04-17 | Disposition: A | Discharge status: Home / Self Care | Payer: Commercial Managed Care - PPO | Source: Ambulatory Visit | Attending: Cardiology | Admitting: Cardiology

## 2022-04-17 ENCOUNTER — Encounter (HOSPITAL_COMMUNITY): Payer: Self-pay | Admitting: Cardiology

## 2022-04-17 VITALS — BP 80/50 | HR 69 | Wt 253.4 lb

## 2022-04-17 DIAGNOSIS — I5042 Chronic combined systolic (congestive) and diastolic (congestive) heart failure: Secondary | ICD-10-CM | POA: Diagnosis not present

## 2022-04-17 LAB — BASIC METABOLIC PANEL
Anion gap: 6 (ref 5–15)
BUN: 32 mg/dL — ABNORMAL HIGH (ref 8–23)
CO2: 22 mmol/L (ref 22–32)
Calcium: 9.1 mg/dL (ref 8.9–10.3)
Chloride: 109 mmol/L (ref 98–111)
Creatinine, Ser: 2.42 mg/dL — ABNORMAL HIGH (ref 0.61–1.24)
GFR, Estimated: 27 mL/min — ABNORMAL LOW (ref >60)
Glucose, Bld: 128 mg/dL — ABNORMAL HIGH (ref 70–99)
Potassium: 4.6 mmol/L (ref 3.5–5.1)
Sodium: 137 mmol/L (ref 135–145)

## 2022-04-17 MED ORDER — ATORVASTATIN CALCIUM 40 MG PO TABS: 40.0000 mg | ORAL_TABLET | Freq: Every day | ORAL | 3 refills | Status: DC

## 2022-04-17 NOTE — Patient Instructions (Signed)
EKG done today.  Labs done today. We will contact you only if your labs are abnormal.  INCREASE Atorvastatin '40mg'$  (1 tablets) by mouth daily.   No other medication changes were made. Please continue all current medications as prescribed.  Your physician recommends that you schedule a follow-up appointment in: 2 months for a lab only appointment and in 4 months with Dr.McLean with an echo prior to your exam.  Your physician has requested that you have an echocardiogram. Echocardiography is a painless test that uses sound waves to create images of your heart. It provides your doctor with information about the size and shape of your heart and how well your heart's chambers and valves are working. This procedure takes approximately one hour. There are no restrictions for this procedure.  If you have any questions or concerns before your next appointment please send [place] a message through [place] or call our office at [phone].    TO LEAVE A MESSAGE FOR THE NURSE SELECT OPTION 2, PLEASE LEAVE A MESSAGE INCLUDING: YOUR NAME DATE OF BIRTH CALL BACK NUMBER REASON FOR CALL**this is important as we prioritize the call backs  YOU WILL RECEIVE A CALL BACK THE SAME DAY AS LONG AS YOU CALL BEFORE 4:00 PM   Do the following things EVERYDAY: Weigh yourself in the morning before breakfast. Write it down and keep it in a log. Take your medicines as prescribed Eat low salt foods--Limit salt (sodium) to 2000 mg per day.  Stay as active as you can everyday Limit all fluids for the day to less than 2 liters   At the [facility], you and your health needs are our priority. As part of our continuing mission to provide you with exceptional heart care, we have created designated Provider Care Teams. These Care Teams include your primary Cardiologist (physician) and Advanced Practice Providers (APPs- Physician Assistants and Nurse Practitioners) who all work together to provide you with the  care you need, when you need it.   You may see any of the following providers on your designated Care Team at your next follow up: Dr Glori Bickers Dr Haynes Kerns, NP Lyda Jester, [place] Audry Riles, PharmD   Please be sure to bring in all your medications bottles to every appointment. '

## 2022-04-18 NOTE — Progress Notes (Signed)
PCP: [name], MD Cardiology: Dr. Oval Linsey EP: Dr. Rayann Heman HF Cardiology: Dr. Aundra Dubin  76 y.o. with history of COVID-19 PNA, atrial fibrillation, and CHF was referred by Dr. Oval Linsey for evaluation of CHF.  Patient has history of renal cell carcinoma with right nephrectomy and diabetes, has CKD stage 3 as well.  He was feeling good until 1/21, when he developed COVID-19 PNA.  He was not hospitalized, but had a severe bout with it.  In 2/21, he was admitted with GI bleeding from gastric ulcers and new atrial fibrillation. He had EGD with clipping of ulcers.  Eventually, he was startedon Eliquis and had DCCV to NSR.  He additionally was noted to have RLE DVT in 2/21.  Echo in 2/21 hospitalization showed EF 25-30%.  Lexiscan Cardiolite showed inferior scar versus artifact and no ischemia.  Repeat echo in 3/21 showed EF still 30-35%.   He had ERAF after 3/21 DCCV and was re-admitted in 5/21 for Tikosyn initiation.  He had repeat DCCV to NSR this admission.  He was only on Tikosyn 125 mcg bid at time of discharge.  He went back into atrial fibrillation again and I cardioverted him to NSR later in 5/21.   Echo in 6/21 showed improved EF 40-45%, RV normal.    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  Tikosyn was stopped, Coreg was increased to 12.5 mg bid.    TEE in 8/21 showed EF 35-40%, global HK, mildly decreased RV function, no LAA thrombus.  Patient was cardioverted to NSR.   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    In 3/22, he had atrial fibrillation ablation. TEE at that time showed EF 30-35%, moderate RV dysfunction.   Echo in 8/22 showed EF 40-45%, diffuse hypokinesis, normal RV.   He returns for followup of CHF and atrial fibrillation.  He is in NSR today.  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  BP is low today (SBP 80s), but he  denies lightheadedness.  He checks BP most days at home, SBP runs 110s-120s at home.  No exertional dyspnea or chest pain.  No palpitations.  Weight down 4 lbs.    Labs (5/21): K 4.5, creatinine 1.73 => 1.9, digoxin 0.9 Labs (6/21): K 5.2, creatinine 1.76 Labs (8/21): K 4.8, creatinine 1.9 Labs (5/22): digoxin 0.6, K 4.5, creatinine 2.09, hgb 14.2, LDL 94, BNP 461 Labs (7/22): K 4.8, creatinine 2.03 Labs (10/22): K 4.5, creatinine 2.06 Labs (12/22): K 4.9, creatinine 2.45 Labs (5/23): K 5.3, creatinine 1.77, LDL 84  PMH: 1. Atrial fibrillation: Paroxysmal.  - DCCV to NSR in 3/21, ERAF.   - Admitted in 5/21 for Tikosyn intiation and repeat DCCV to NSR.  - LAA thrombus in 7/21, atrial fibrillation ablation not done and Tikosyn stopped.  - Amiodarone started, repeat TEE in 8/21 with no thrombus and DCCV to NSR.  - Atrial fibrillation ablation 3/22 2. 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 Renal cell carcinoma s/p right nephrectomy 4. CKD: Stage 3. 5. Hyperlipidemia.  6. H/o COVID-19 PNA in 1/21.  7. H/o GI bleeding from gastric ulcers in 3/21.  8. Cardiomyopathy: Echo (2/21) with EF 25-30%.  - Cardiolite (2/21) with inferior scar, no ischemia.  - Echo (3/21): EF 30-35%, global hypokinesis, mild MR.  - Echo (6/21): EF 40-45%, RV normal size and systolic function, PASP 36, IVC  dilated.  - TEE (8/21): EF 35-40%, diffuse hypokinesis, mildly decreased RV systolic function.  - LHC/RHC (10/21): 40% D2 stenosis, 40% PDA stenosis; mean RA 18, PA 51/23 mean 32, mean PCWP 22, CI 2.2 Thermo/1.9 Fick, PVR 1.9 WU.  - TEE (2/22): EF 30-35%, diffuse hypokinesis, moderately decreased RV systolic function.  - Echo (8/22): EF 40-45%, diffuse hypokinesis, normal RV.  9. RLE DVT in 2/21 post-COVID-19 PNA.   Social History   Socioeconomic History   Marital status: Married    Spouse name: Not on file   Number of children: Not on file   Years of education: Not on file   Highest education level:  Not on file  Occupational History   Occupation: truck driver  Tobacco Use   Smoking status: Former    Packs/day: 2.00    Years: 35.00    Total pack years: 70.00    Types: Cigarettes    Quit date: 2000    Years since quitting: 23.5   Smokeless tobacco: Never  Vaping Use   Vaping Use: Never used  Substance and Sexual Activity   Alcohol use: Yes    Alcohol/week: 1.0 - 2.0 standard drink of alcohol    Types: 1 - 2 Cans of beer per week    Comment: Socially   Drug use: No   Sexual activity: Not on file  Other Topics Concern   Not on file  Social History Narrative   Lives with wife in a one story home.  Has 3 children.     Works as a Administrator.     Education: 12th grade.   Social Determinants of Health   Financial Resource Strain: Not on file  Food Insecurity: Not on file  Transportation Needs: Not on file  Physical Activity: Not on file  Stress: Not on file  Social Connections: Not on file  Intimate Partner Violence: Not on file   Family History  Problem Relation Age of Onset   Cancer Mother    Heart attack Father    Heart attack Brother    Heart disease Sister    Stroke Paternal Grandmother    ROS: All systems reviewed and negative except as per HPI.   Current Outpatient Medications  Medication Sig Dispense Refill   ACCU-CHEK GUIDE test strip daily. for testing     Accu-Chek Softclix Lancets lancets      acetaminophen (TYLENOL) 500 MG tablet Take 1,000 mg by mouth every 6 (six) hours as needed for mild pain or headache.     ascorbic acid (VITAMIN C) 1000 MG tablet Take 1,000 mg by mouth every evening.      Blood Glucose Monitoring Suppl (ACCU-CHEK GUIDE ME) w/Device KIT See admin instructions.     carvedilol (COREG) 12.5 MG tablet TAKE 1 TABLET (12.5 MG TOTAL) BY MOUTH IN THE MORNING AND AT BEDTIME. 180 tablet 3   ELIQUIS 5 MG TABS tablet TAKE 1 TABLET BY MOUTH TWICE A DAY 180 tablet 3   FARXIGA 10 MG TABS tablet TAKE 1 TABLET BY MOUTH DAILY BEFORE BREAKFAST.  90 tablet 3   Garlic 2122 MG CAPS Take 1,000 mg by mouth every evening.     Glucosamine HCl 1000 MG TABS Take 1,000 mg by mouth every evening.     Krill Oil 500 MG CAPS Take 1 capsule by mouth daily.     Multiple Vitamin (MULTIVITAMIN WITH MINERALS) TABS tablet Take 1 tablet by mouth daily with breakfast.      Omega-3 Fatty Acids (FISH  OIL PO) Take 350 mg by mouth every evening.      sacubitril-valsartan (ENTRESTO) 24-26 MG TAKE 1 TABLET BY MOUTH TWICE A DAY 180 tablet 3   Saw Palmetto, Serenoa repens, (SAW PALMETTO PO) Take 540 mg by mouth every evening.      spironolactone (ALDACTONE) 25 MG tablet Take 1 tablet (25 mg total) by mouth daily. 90 tablet 3   torsemide (DEMADEX) 10 MG tablet Take 10 mg by mouth as needed for fluid.     atorvastatin (LIPITOR) 40 MG tablet Take 1 tablet (40 mg total) by mouth daily. 90 tablet 3   No current facility-administered medications for this encounter.   BP (!) 80/50   Pulse 69   Wt 114.9 kg (253 lb 6.4 oz)   SpO2 95%   BMI 33.43 kg/m  General: NAD Neck: No JVD, no thyromegaly or thyroid nodule.  Lungs: Clear to auscultation bilaterally with normal respiratory effort. CV: Nondisplaced PMI.  Heart regular S1/S2, no S3/S4, no murmur.  No peripheral edema.  No carotid bruit.  Normal pedal pulses.  Abdomen: Soft, nontender, no hepatosplenomegaly, no distention.  Skin: Intact without lesions or rashes.  Neurologic: Alert and oriented x 3.  Psych: Normal affect. Extremities: No clubbing or cyanosis.  HEENT: Normal.   Assessment/Plan: 1. Chronic systolic CHF: Echo in 3/74 with EF 25-30%, echo in 3/21 with EF 30-35%.  Cardiolite with inferior scar vs artifact, no ischemia. Cath in 10/21 with nonobstructive CAD.  Cause of cardiomyopathy is uncertain.  Could be related to atrial fibrillation (though not generally in RVR), cannot rule out cardiomyopathy related to myocarditis (?COVID-19 myocarditis).  Repeat echo in 6/21 showed EF up to 40-45%.  TEE in 8/21  with EF 35-40%.  TEE in 3/22 with EF 30-35% and moderate RV dysfunction.  Echo in 8/22 showed EF 40-45%.  NYHA class I-II symptoms, he is not volume overloaded on exam. Medication titration has been limited by soft blood pressure and CKD. SBP 80s today but he is not symptomatic and BP runs higher at home.  - Recheck BP over the next few days at home.  If SBP remains in 80s, will need to cut back on meds.  If this is isolated, would not make changes.  - Continue Coreg 12.5 mg bid.  - Continue Entresto 24/26 bid. BMET today.   - Continue spironolactone 25 mg daily.  - Continue dapagliflozin 10 mg daily.   - EF is out of range for ICD.  Not CRT candidate with narrow QRS.   - He is not taking any torsemide.   - Repeat echo at followup appt.  2. Atrial fibrillation:  DCCV x 2 on Tikosyn and DCCV x 1 on amiodarone.  He finally had atrial fibrillation ablation in 3/22 and is now in NSR.  He is off amiodarone. NSR today.  - Continue Eliquis.  3. CKD: Stage 3.   - BMET today. - Push hydration - Avoid NSAIDs 4. Hyperlipidemia: Nonobstructive CAD, goal LDL < 70.  - Increase atorvastatin to 40 mg daily, lipids/LFTs in 2 months.   Followup 4 months with echo.    [name] 04/18/2022

## 2022-04-25 ENCOUNTER — Other Ambulatory Visit (HOSPITAL_COMMUNITY): Payer: Self-pay | Admitting: Cardiology

## 2022-05-21 ENCOUNTER — Encounter (HOSPITAL_COMMUNITY): Payer: Self-pay | Admitting: Cardiology

## 2022-05-21 NOTE — Telephone Encounter (Signed)
He should stay on 5 mg bid Eliquis.  Would try saline nasal spray bid to see if keeping nasal passages moist decreases bleeding (often occurs when you get dried out.  If that does not help, would consider ENT referral to see if a superficial vessel needs to be cauterized.

## 2022-06-18 ENCOUNTER — Ambulatory Visit (HOSPITAL_COMMUNITY)
Admission: RE | Admit: 2022-06-18 | Discharge: 2022-06-18 | Disposition: A | Discharge status: Home / Self Care | Payer: Commercial Managed Care - PPO | Source: Ambulatory Visit | Attending: Cardiology | Admitting: Cardiology

## 2022-06-18 DIAGNOSIS — I5042 Chronic combined systolic (congestive) and diastolic (congestive) heart failure: Secondary | ICD-10-CM

## 2022-06-18 LAB — LIPID PANEL
Cholesterol: 134 mg/dL (ref 0–200)
HDL: 42 mg/dL (ref >40)
LDL Cholesterol: 81 mg/dL (ref 0–99)
Total CHOL/HDL Ratio: 3.2 RATIO
Triglycerides: 57 mg/dL (ref <150)
VLDL: 11 mg/dL (ref 0–40)

## 2022-06-18 LAB — HEPATIC FUNCTION PANEL
ALT: 21 U/L (ref 0–44)
AST: 21 U/L (ref 15–41)
Albumin: 3.7 g/dL (ref 3.5–5.0)
Alkaline Phosphatase: 58 U/L (ref 38–126)
Bilirubin, Direct: 0.3 mg/dL — ABNORMAL HIGH (ref 0.0–0.2)
Indirect Bilirubin: 0.9 mg/dL (ref 0.3–0.9)
Total Bilirubin: 1.2 mg/dL (ref 0.3–1.2)
Total Protein: 6.2 g/dL — ABNORMAL LOW (ref 6.5–8.1)

## 2022-06-19 ENCOUNTER — Telehealth (HOSPITAL_COMMUNITY): Payer: Self-pay | Admitting: Surgery

## 2022-06-19 MED ORDER — ATORVASTATIN CALCIUM 80 MG PO TABS: 80.0000 mg | ORAL_TABLET | Freq: Every day | ORAL | 6 refills | Status: DC

## 2022-06-19 NOTE — Telephone Encounter (Signed)
-----   Message from Larey Dresser, MD sent at 06/19/2022  9:41 AM EDT ----- Would like to see LDL < 70.  Increase atorvastatin to 80 mg daily with lipids/LFTs in 2 months.

## 2022-06-19 NOTE — Telephone Encounter (Signed)
Patient's wife called back and I reviewed results and recommendations with her.  She takes care of his pill box and will adjust the Atorvastatin dosage.  I have updated medlist in [facility].  She would like to have him wait until he sees PCP in order to have labs rechecked. I will forward these results to patients PCP per wife's request.

## 2022-06-19 NOTE — Telephone Encounter (Signed)
I attempted to reach patient to review results and recommendations per provider.  I left a message for a return call. 

## 2022-09-02 ENCOUNTER — Other Ambulatory Visit (HOSPITAL_COMMUNITY): Payer: Self-pay | Admitting: Cardiology

## 2022-10-02 ENCOUNTER — Telehealth (HOSPITAL_COMMUNITY): Payer: Self-pay | Admitting: Cardiology

## 2022-10-02 MED ORDER — EZETIMIBE 10 MG PO TABS: 10.0000 mg | ORAL_TABLET | Freq: Every day | ORAL | 3 refills | Status: DC

## 2022-10-02 NOTE — Telephone Encounter (Signed)
Abnormal labs reviceved from PCP Cholesterol 130 Tri 90 LDL 70 HDL 43  Per Allena Katz NP Goal LDL less than 70 Continue atorvastatin add zetia 10 mg one tab daily    LMOM

## 2022-10-04 ENCOUNTER — Encounter (HOSPITAL_COMMUNITY): Payer: Self-pay | Admitting: Cardiology

## 2022-10-05 ENCOUNTER — Other Ambulatory Visit (HOSPITAL_COMMUNITY): Payer: Self-pay | Admitting: Cardiology

## 2022-10-06 ENCOUNTER — Other Ambulatory Visit (HOSPITAL_COMMUNITY): Payer: Self-pay | Admitting: Cardiology

## 2022-10-17 NOTE — Telephone Encounter (Signed)
Addressed in [place] messages/duplicate encounter

## 2022-10-30 ENCOUNTER — Other Ambulatory Visit (HOSPITAL_COMMUNITY): Payer: Self-pay | Admitting: Cardiology

## 2022-11-02 ENCOUNTER — Other Ambulatory Visit (HOSPITAL_COMMUNITY): Payer: Self-pay | Admitting: Cardiology

## 2022-11-14 ENCOUNTER — Encounter (HOSPITAL_COMMUNITY): Payer: Self-pay | Admitting: Cardiology

## 2022-11-14 ENCOUNTER — Ambulatory Visit (HOSPITAL_COMMUNITY)
Admission: RE | Admit: 2022-11-14 | Discharge: 2022-11-14 | Disposition: A | Discharge status: Home / Self Care | Payer: Commercial Managed Care - PPO | Source: Ambulatory Visit | Attending: Family Medicine | Admitting: Family Medicine

## 2022-11-14 ENCOUNTER — Ambulatory Visit (HOSPITAL_BASED_OUTPATIENT_CLINIC_OR_DEPARTMENT_OTHER)
Admission: RE | Admit: 2022-11-14 | Discharge: 2022-11-14 | Disposition: A | Discharge status: Home / Self Care | Payer: Commercial Managed Care - PPO | Source: Ambulatory Visit | Attending: Cardiology | Admitting: Cardiology

## 2022-11-14 VITALS — BP 106/70 | HR 60 | Wt 256.8 lb

## 2022-11-14 DIAGNOSIS — Z85528 Personal history of other malignant neoplasm of kidney: Secondary | ICD-10-CM | POA: Diagnosis not present

## 2022-11-14 DIAGNOSIS — I428 Other cardiomyopathies: Secondary | ICD-10-CM | POA: Diagnosis not present

## 2022-11-14 DIAGNOSIS — Z8711 Personal history of peptic ulcer disease: Secondary | ICD-10-CM | POA: Insufficient documentation

## 2022-11-14 DIAGNOSIS — I480 Paroxysmal atrial fibrillation: Secondary | ICD-10-CM | POA: Insufficient documentation

## 2022-11-14 DIAGNOSIS — I5022 Chronic systolic (congestive) heart failure: Secondary | ICD-10-CM | POA: Diagnosis not present

## 2022-11-14 DIAGNOSIS — Z79899 Other long term (current) drug therapy: Secondary | ICD-10-CM | POA: Diagnosis not present

## 2022-11-14 DIAGNOSIS — I5042 Chronic combined systolic (congestive) and diastolic (congestive) heart failure: Secondary | ICD-10-CM | POA: Diagnosis not present

## 2022-11-14 DIAGNOSIS — E1122 Type 2 diabetes mellitus with diabetic chronic kidney disease: Secondary | ICD-10-CM | POA: Insufficient documentation

## 2022-11-14 DIAGNOSIS — N183 Chronic kidney disease, stage 3 unspecified: Secondary | ICD-10-CM | POA: Diagnosis not present

## 2022-11-14 DIAGNOSIS — I2510 Atherosclerotic heart disease of native coronary artery without angina pectoris: Secondary | ICD-10-CM | POA: Diagnosis not present

## 2022-11-14 DIAGNOSIS — Z8616 Personal history of COVID-19: Secondary | ICD-10-CM | POA: Diagnosis not present

## 2022-11-14 DIAGNOSIS — Z7901 Long term (current) use of anticoagulants: Secondary | ICD-10-CM | POA: Diagnosis not present

## 2022-11-14 DIAGNOSIS — E785 Hyperlipidemia, unspecified: Secondary | ICD-10-CM | POA: Insufficient documentation

## 2022-11-14 DIAGNOSIS — N529 Male erectile dysfunction, unspecified: Secondary | ICD-10-CM | POA: Insufficient documentation

## 2022-11-14 LAB — ECHOCARDIOGRAM COMPLETE
AR max vel: 3.43 cm2
AV Area VTI: 3.48 cm2
AV Area mean vel: 3.16 cm2
AV Mean grad: 2 mmHg
AV Peak grad: 3.9 mmHg
Ao pk vel: 0.99 m/s
Area-P 1/2: 2.82 cm2
S' Lateral: 4.5 cm

## 2022-11-14 LAB — COMPREHENSIVE METABOLIC PANEL
ALT: 29 U/L (ref 0–44)
AST: 23 U/L (ref 15–41)
Albumin: 3.9 g/dL (ref 3.5–5.0)
Alkaline Phosphatase: 63 U/L (ref 38–126)
Anion gap: 9 (ref 5–15)
BUN: 26 mg/dL — ABNORMAL HIGH (ref 8–23)
CO2: 22 mmol/L (ref 22–32)
Calcium: 9.1 mg/dL (ref 8.9–10.3)
Chloride: 106 mmol/L (ref 98–111)
Creatinine, Ser: 1.82 mg/dL — ABNORMAL HIGH (ref 0.61–1.24)
GFR, Estimated: 38 mL/min — ABNORMAL LOW (ref >60)
Glucose, Bld: 103 mg/dL — ABNORMAL HIGH (ref 70–99)
Potassium: 4.6 mmol/L (ref 3.5–5.1)
Sodium: 137 mmol/L (ref 135–145)
Total Bilirubin: 1 mg/dL (ref 0.3–1.2)
Total Protein: 6.6 g/dL (ref 6.5–8.1)

## 2022-11-14 LAB — CBC
HCT: 41.1 % (ref 39.0–52.0)
Hemoglobin: 13.7 g/dL (ref 13.0–17.0)
MCH: 32.7 pg (ref 26.0–34.0)
MCHC: 33.3 g/dL (ref 30.0–36.0)
MCV: 98.1 fL (ref 80.0–100.0)
Platelets: 164 10*3/uL (ref 150–400)
RBC: 4.19 MIL/uL — ABNORMAL LOW (ref 4.22–5.81)
RDW: 12.8 % (ref 11.5–15.5)
WBC: 7.6 10*3/uL (ref 4.0–10.5)
nRBC: 0 % (ref 0.0–0.2)

## 2022-11-14 LAB — LIPID PANEL
Cholesterol: 107 mg/dL (ref 0–200)
HDL: 42 mg/dL (ref >40)
LDL Cholesterol: 54 mg/dL (ref 0–99)
Total CHOL/HDL Ratio: 2.5 RATIO
Triglycerides: 55 mg/dL (ref <150)
VLDL: 11 mg/dL (ref 0–40)

## 2022-11-14 MED ORDER — SILDENAFIL CITRATE 50 MG PO TABS: 50.0000 mg | ORAL_TABLET | Freq: Every day | ORAL | 3 refills | Status: AC | PRN

## 2022-11-14 NOTE — Patient Instructions (Signed)
Medication Changes:  Take Viagra 50 mg AS NEEDED, make sure not to take any nitroglycerine when you take this  Lab Work:  Labs done today, your results will be available in MyChart, we will contact you for abnormal readings.  Testing/Procedures:  none  Referrals:  none  Special Instructions // Education:  Do the following things EVERYDAY: Weigh yourself in the morning before breakfast. Write it down and keep it in a log. Take your medicines as prescribed Eat low salt foods--Limit salt (sodium) to 2000 mg per day.  Stay as active as you can everyday Limit all fluids for the day to less than 2 liters   Follow-Up in: 4 months  At the [facility], you and your health needs are our priority. We have a designated team specialized in the treatment of Heart Failure. This Care Team includes your primary Heart Failure Specialized Cardiologist (physician), Advanced Practice Providers (APPs- Physician Assistants and Nurse Practitioners), and Pharmacist who all work together to provide you with the care you need, when you need it.   You may see any of the following providers on your designated Care Team at your next follow up:  Dr. Glori Bickers Dr. [name] Dr. Roxana Hires, NP Lyda Jester, [place] [facility] [place], [place] Forestine Na, NP Audry Riles, PharmD   Please be sure to bring in all your medications bottles to every appointment.   Need to Contact us:  If you have any questions or concerns before your next appointment please send [place] a message through [place] or call our office at [phone].    TO LEAVE A MESSAGE FOR THE NURSE SELECT OPTION 2, PLEASE LEAVE A MESSAGE INCLUDING: YOUR NAME DATE OF BIRTH CALL BACK NUMBER REASON FOR CALL**this is important as we prioritize the call backs  YOU WILL RECEIVE A CALL BACK THE SAME DAY AS LONG AS YOU CALL BEFORE 4:00 PM

## 2022-11-14 NOTE — Progress Notes (Signed)
  Echocardiogram 2D Echocardiogram has been performed.  Ronny Flurry 11/14/2022, 2:57 PM

## 2022-11-15 ENCOUNTER — Encounter (HOSPITAL_COMMUNITY): Payer: Self-pay | Admitting: *Deleted

## 2022-11-15 NOTE — Progress Notes (Signed)
PCP: [name], MD Cardiology: Dr. Oval Linsey EP: Dr. Rayann Heman HF Cardiology: Dr. Aundra Dubin  77 y.o. with history of COVID-19 PNA, atrial fibrillation, and CHF was referred by Dr. Oval Linsey for evaluation of CHF.  Patient has history of renal cell carcinoma with right nephrectomy and diabetes, has CKD stage 3 as well.  He was feeling good until 1/21, when he developed COVID-19 PNA.  He was not hospitalized, but had a severe bout with it.  In 2/21, he was admitted with GI bleeding from gastric ulcers and new atrial fibrillation. He had EGD with clipping of ulcers.  Eventually, he was startedon Eliquis and had DCCV to NSR.  He additionally was noted to have RLE DVT in 2/21.  Echo in 2/21 hospitalization showed EF 25-30%.  Lexiscan Cardiolite showed inferior scar versus artifact and no ischemia.  Repeat echo in 3/21 showed EF still 30-35%.   He had ERAF after 3/21 DCCV and was re-admitted in 5/21 for Tikosyn initiation.  He had repeat DCCV to NSR this admission.  He was only on Tikosyn 125 mcg bid at time of discharge.  He went back into atrial fibrillation again and I cardioverted him to NSR later in 5/21.   Echo in 6/21 showed improved EF 40-45%, RV normal.    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  Tikosyn was stopped, Coreg was increased to 12.5 mg bid.    TEE in 8/21 showed EF 35-40%, global HK, mildly decreased RV function, no LAA thrombus.  Patient was cardioverted to NSR.   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    In 3/22, he had atrial fibrillation ablation. TEE at that time showed EF 30-35%, moderate RV dysfunction.   Echo in 8/22 showed EF 40-45%, diffuse hypokinesis, normal RV.  Echo was done today and reviewed, EF remains 40-45%, mild LV dilation, mildly decreased RV systolic function.   He returns for followup of CHF and atrial  fibrillation.  He is in NSR today.  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  No exertional dyspnea or chest pain.  Excellent exercise tolerance.  No palpitations.  He has not had to use torsemide.    Labs (5/21): K 4.5, creatinine 1.73 => 1.9, digoxin 0.9 Labs (6/21): K 5.2, creatinine 1.76 Labs (8/21): K 4.8, creatinine 1.9 Labs (5/22): digoxin 0.6, K 4.5, creatinine 2.09, hgb 14.2, LDL 94, BNP 461 Labs (7/22): K 4.8, creatinine 2.03 Labs (10/22): K 4.5, creatinine 2.06 Labs (12/22): K 4.9, creatinine 2.45 Labs (5/23): K 5.3, creatinine 1.77, LDL 84 Labs (7/23): K 4.6, creatinine 2.42 Labs (9/23): LDL 81  PMH: 1. Atrial fibrillation: Paroxysmal.  - DCCV to NSR in 3/21, ERAF.   - Admitted in 5/21 for Tikosyn intiation and repeat DCCV to NSR.  - LAA thrombus in 7/21, atrial fibrillation ablation not done and Tikosyn stopped.  - Amiodarone started, repeat TEE in 8/21 with no thrombus and DCCV to NSR.  - Atrial fibrillation ablation 3/22 2. 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 Renal cell carcinoma s/p right nephrectomy 4. CKD: Stage 3. 5. Hyperlipidemia.  6. H/o COVID-19 PNA in 1/21.  7. H/o GI bleeding from gastric ulcers in 3/21.  8. Cardiomyopathy: Echo (2/21) with EF 25-30%.  - Cardiolite (2/21) with inferior scar, no ischemia.  - Echo (3/21): EF 30-35%, global hypokinesis, mild MR.  - Echo (6/21): EF  40-45%, RV normal size and systolic function, PASP 36, IVC dilated.  - TEE (8/21): EF 35-40%, diffuse hypokinesis, mildly decreased RV systolic function.  - LHC/RHC (10/21): 40% D2 stenosis, 40% PDA stenosis; mean RA 18, PA 51/23 mean 32, mean PCWP 22, CI 2.2 Thermo/1.9 Fick, PVR 1.9 WU.  - TEE (2/22): EF 30-35%, diffuse hypokinesis, moderately decreased RV systolic function.  - Echo (8/22): EF 40-45%, diffuse hypokinesis, normal RV.  - Echo (2/24): EF 40-45%, mild LV dilation, mildly decreased RV systolic function.  9. RLE DVT in 2/21 post-COVID-19 PNA.   Social History    Socioeconomic History   Marital status: Married    Spouse name: Not on file   Number of children: Not on file   Years of education: Not on file   Highest education level: Not on file  Occupational History   Occupation: truck driver  Tobacco Use   Smoking status: Former    Packs/day: 2.00    Years: 35.00    Total pack years: 70.00    Types: Cigarettes    Quit date: 2000    Years since quitting: 24.1   Smokeless tobacco: Never  Vaping Use   Vaping Use: Never used  Substance and Sexual Activity   Alcohol use: Yes    Alcohol/week: 1.0 - 2.0 standard drink of alcohol    Types: 1 - 2 Cans of beer per week    Comment: Socially   Drug use: No   Sexual activity: Not on file  Other Topics Concern   Not on file  Social History Narrative   Lives with wife in a one story home.  Has 3 children.     Works as a Administrator.     Education: 12th grade.   Social Determinants of Health   Financial Resource Strain: Not on file  Food Insecurity: Not on file  Transportation Needs: Not on file  Physical Activity: Not on file  Stress: Not on file  Social Connections: Not on file  Intimate Partner Violence: Not on file   Family History  Problem Relation Age of Onset   Cancer Mother    Heart attack Father    Heart attack Brother    Heart disease Sister    Stroke Paternal Grandmother    ROS: All systems reviewed and negative except as per HPI.   Current Outpatient Medications  Medication Sig Dispense Refill   ACCU-CHEK GUIDE test strip daily. for testing     Accu-Chek Softclix Lancets lancets      acetaminophen (TYLENOL) 500 MG tablet Take 1,000 mg by mouth every 6 (six) hours as needed for mild pain or headache.     apixaban (ELIQUIS) 5 MG TABS tablet Take 5 mg by mouth daily in the afternoon.     ascorbic acid (VITAMIN C) 1000 MG tablet Take 1,000 mg by mouth every evening.      atorvastatin (LIPITOR) 80 MG tablet Take 1 tablet (80 mg total) by mouth daily. 30 tablet 6   Blood  Glucose Monitoring Suppl (ACCU-CHEK GUIDE ME) w/Device KIT See admin instructions.     carvedilol (COREG) 12.5 MG tablet TAKE 1 TABLET (12.5 MG TOTAL) BY MOUTH IN THE MORNING AND AT BEDTIME. 180 tablet 3   dapagliflozin propanediol (FARXIGA) 10 MG TABS tablet TAKE 1 TABLET BY MOUTH EVERY DAY BEFORE BREAKFAST 90 tablet 0   ENTRESTO 24-26 MG TAKE 1 TABLET BY MOUTH TWICE A DAY 60 tablet 11   ezetimibe (ZETIA) 10 MG tablet Take 1  tablet (10 mg total) by mouth daily. 90 tablet 3   Garlic 0175 MG CAPS Take 1,000 mg by mouth every evening.     Glucosamine HCl 1000 MG TABS Take 1,000 mg by mouth every evening.     Krill Oil 500 MG CAPS Take 1 capsule by mouth daily.     Multiple Vitamin (MULTIVITAMIN WITH MINERALS) TABS tablet Take 1 tablet by mouth daily with breakfast.      Omega-3 Fatty Acids (FISH OIL PO) Take 350 mg by mouth every evening.      Saw Palmetto, Serenoa repens, (SAW PALMETTO PO) Take 540 mg by mouth every evening.      sildenafil (VIAGRA) 50 MG tablet Take 1 tablet (50 mg total) by mouth daily as needed for erectile dysfunction. 10 tablet 3   spironolactone (ALDACTONE) 25 MG tablet TAKE 1 TABLET (25 MG TOTAL) BY MOUTH DAILY. 90 tablet 3   torsemide (DEMADEX) 10 MG tablet Take 10 mg by mouth as needed for fluid.     No current facility-administered medications for this encounter.   BP 106/70   Pulse 60   Wt 116.5 kg (256 lb 12.8 oz)   SpO2 97%   BMI 33.88 kg/m  General: NAD Neck: No JVD, no thyromegaly or thyroid nodule.  Lungs: Clear to auscultation bilaterally with normal respiratory effort. CV: Nondisplaced PMI.  Heart regular S1/S2, no S3/S4, no murmur.  No peripheral edema.  No carotid bruit.  Normal pedal pulses.  Abdomen: Soft, nontender, no hepatosplenomegaly, no distention.  Skin: Intact without lesions or rashes.  Neurologic: Alert and oriented x 3.  Psych: Normal affect. Extremities: No clubbing or cyanosis.  HEENT: Normal.   Assessment/Plan: 1. Chronic systolic  CHF: Nonischemic cardiomyopathy.  Echo in 2/21 with EF 25-30%, echo in 3/21 with EF 30-35%.  Cardiolite with inferior scar vs artifact, no ischemia. Cath in 10/21 with nonobstructive CAD.  Cause of cardiomyopathy is uncertain.  Could be related to atrial fibrillation (though not generally in RVR), cannot rule out cardiomyopathy related to myocarditis (?COVID-19 myocarditis).  Repeat echo in 6/21 showed EF up to 40-45%.  TEE in 8/21 with EF 35-40%.  TEE in 3/22 with EF 30-35% and moderate RV dysfunction.  Echo in 8/22 showed EF 40-45%.  Echo today was stable with EF 40-45% (personally reviewed).  NYHA class I-II symptoms, he is not volume overloaded on exam. Medication titration has been limited by soft blood pressure and CKD. SBP in 100s generally.  - Continue Coreg 12.5 mg bid.  - Continue Entresto 24/26 bid. BMET today.   - Continue spironolactone 25 mg daily.  - Continue dapagliflozin 10 mg daily.   - EF is out of range for ICD.  Not CRT candidate with narrow QRS.   - He is not taking any torsemide.   2. Atrial fibrillation:  DCCV x 2 on Tikosyn and DCCV x 1 on amiodarone.  He finally had atrial fibrillation ablation in 3/22 and is now in NSR.  He is off amiodarone. NSR today.  - Continue Eliquis, needs to take bid (has only been taking qd).  3. CKD: Stage 3.   - BMET today. - Avoid NSAIDs 4. Hyperlipidemia: Nonobstructive CAD, goal LDL < 70.  - Continue atorvastatin, check lipids today.  5. Erectile dysfunction: I will let him try Viagra 50 mg qhs.   Followup 4 months with APP  [name] 11/15/2022

## 2022-12-04 ENCOUNTER — Other Ambulatory Visit (HOSPITAL_COMMUNITY): Payer: Self-pay | Admitting: Cardiology

## 2022-12-12 ENCOUNTER — Other Ambulatory Visit (HOSPITAL_COMMUNITY): Payer: Self-pay | Admitting: Cardiology

## 2023-01-29 ENCOUNTER — Other Ambulatory Visit (HOSPITAL_COMMUNITY): Payer: Self-pay | Admitting: Cardiology

## 2023-01-29 ENCOUNTER — Other Ambulatory Visit (HOSPITAL_COMMUNITY): Payer: Self-pay

## 2023-01-29 ENCOUNTER — Encounter (HOSPITAL_COMMUNITY): Payer: Self-pay | Admitting: Cardiology

## 2023-01-30 ENCOUNTER — Telehealth (HOSPITAL_COMMUNITY): Payer: Self-pay | Admitting: Pharmacy Technician

## 2023-01-30 MED ORDER — DAPAGLIFLOZIN PROPANEDIOL 10 MG PO TABS: 10.0000 mg | ORAL_TABLET | Freq: Every day | ORAL | 0 refills | Status: DC

## 2023-01-30 NOTE — Telephone Encounter (Signed)
Patient Advocate Encounter   Received notification from [employer] that prior authorization for Marcelline Deist is required.   PA submitted on CoverMyMeds Key [id number] Status is pending   Will continue to follow.

## 2023-01-31 ENCOUNTER — Other Ambulatory Visit (HOSPITAL_COMMUNITY): Payer: Self-pay

## 2023-01-31 MED ORDER — FARXIGA 10 MG PO TABS: 10.0000 mg | ORAL_TABLET | Freq: Every day | ORAL | 3 refills | Status: DC

## 2023-01-31 NOTE — Telephone Encounter (Signed)
Advanced Heart Failure Patient Advocate Encounter  Patients insurance denied PA for [place]. Reasoning, generic Marcelline Deist is excluded. Venida Jarvis or London Pepper is preferred. Co-pay, $47. Sent 90 day RX request to [facility] ([facility] to [facility]. Sent patient mychart message with update.  Archer Asa, CPhT

## 2023-02-04 ENCOUNTER — Other Ambulatory Visit (HOSPITAL_COMMUNITY): Payer: Self-pay

## 2023-02-07 ENCOUNTER — Telehealth (HOSPITAL_COMMUNITY): Payer: Self-pay | Admitting: Pharmacy Technician

## 2023-02-07 NOTE — Telephone Encounter (Signed)
Patient Advocate Encounter   Received notification from [employer] that prior authorization for Melvin Ramirez is required.   PA submitted on CoverMyMeds Key [id number] Status is pending   Will continue to follow.

## 2023-02-07 NOTE — Telephone Encounter (Signed)
Advanced Heart Failure Patient Advocate Encounter  Prior Authorization for Marcelline Deist has been approved.    PA# [id number] Effective dates: 02/06/23 through 02/06/24  Called and updated the patient's wife.  Archer Asa, CPhT

## 2023-03-14 ENCOUNTER — Encounter (HOSPITAL_COMMUNITY): Payer: Self-pay

## 2023-03-14 ENCOUNTER — Ambulatory Visit (HOSPITAL_COMMUNITY)
Admission: RE | Admit: 2023-03-14 | Discharge: 2023-03-14 | Disposition: A | Discharge status: Home / Self Care | Payer: Commercial Managed Care - PPO | Source: Ambulatory Visit | Attending: Physician Assistant | Admitting: Physician Assistant

## 2023-03-14 VITALS — BP 116/70 | HR 59 | Wt 256.4 lb

## 2023-03-14 DIAGNOSIS — E782 Mixed hyperlipidemia: Secondary | ICD-10-CM

## 2023-03-14 DIAGNOSIS — Z8616 Personal history of COVID-19: Secondary | ICD-10-CM | POA: Insufficient documentation

## 2023-03-14 DIAGNOSIS — E1122 Type 2 diabetes mellitus with diabetic chronic kidney disease: Secondary | ICD-10-CM | POA: Insufficient documentation

## 2023-03-14 DIAGNOSIS — N1832 Chronic kidney disease, stage 3b: Secondary | ICD-10-CM | POA: Insufficient documentation

## 2023-03-14 DIAGNOSIS — I2510 Atherosclerotic heart disease of native coronary artery without angina pectoris: Secondary | ICD-10-CM | POA: Insufficient documentation

## 2023-03-14 DIAGNOSIS — Z7901 Long term (current) use of anticoagulants: Secondary | ICD-10-CM | POA: Insufficient documentation

## 2023-03-14 DIAGNOSIS — I480 Paroxysmal atrial fibrillation: Secondary | ICD-10-CM | POA: Diagnosis not present

## 2023-03-14 DIAGNOSIS — I5022 Chronic systolic (congestive) heart failure: Secondary | ICD-10-CM | POA: Diagnosis not present

## 2023-03-14 DIAGNOSIS — Z87891 Personal history of nicotine dependence: Secondary | ICD-10-CM | POA: Insufficient documentation

## 2023-03-14 DIAGNOSIS — Z79899 Other long term (current) drug therapy: Secondary | ICD-10-CM | POA: Diagnosis not present

## 2023-03-14 DIAGNOSIS — E785 Hyperlipidemia, unspecified: Secondary | ICD-10-CM | POA: Insufficient documentation

## 2023-03-14 DIAGNOSIS — Z86718 Personal history of other venous thrombosis and embolism: Secondary | ICD-10-CM | POA: Insufficient documentation

## 2023-03-14 DIAGNOSIS — I428 Other cardiomyopathies: Secondary | ICD-10-CM | POA: Diagnosis not present

## 2023-03-14 DIAGNOSIS — Z7984 Long term (current) use of oral hypoglycemic drugs: Secondary | ICD-10-CM | POA: Insufficient documentation

## 2023-03-14 LAB — COMPREHENSIVE METABOLIC PANEL
ALT: 24 U/L (ref 0–44)
AST: 25 U/L (ref 15–41)
Albumin: 3.6 g/dL (ref 3.5–5.0)
Alkaline Phosphatase: 67 U/L (ref 38–126)
Anion gap: 7 (ref 5–15)
BUN: 17 mg/dL (ref 8–23)
CO2: 23 mmol/L (ref 22–32)
Calcium: 8.7 mg/dL — ABNORMAL LOW (ref 8.9–10.3)
Chloride: 108 mmol/L (ref 98–111)
Creatinine, Ser: 1.96 mg/dL — ABNORMAL HIGH (ref 0.61–1.24)
GFR, Estimated: 35 mL/min — ABNORMAL LOW (ref >60)
Glucose, Bld: 100 mg/dL — ABNORMAL HIGH (ref 70–99)
Potassium: 4.8 mmol/L (ref 3.5–5.1)
Sodium: 138 mmol/L (ref 135–145)
Total Bilirubin: 1.3 mg/dL — ABNORMAL HIGH (ref 0.3–1.2)
Total Protein: 6.3 g/dL — ABNORMAL LOW (ref 6.5–8.1)

## 2023-03-14 LAB — BRAIN NATRIURETIC PEPTIDE: B Natriuretic Peptide: 210.6 pg/mL — ABNORMAL HIGH (ref 0.0–100.0)

## 2023-03-14 MED ORDER — ENTRESTO 24-26 MG PO TABS: 1.0000 | ORAL_TABLET | Freq: Two times a day (BID) | ORAL | 6 refills | Status: DC

## 2023-03-14 MED ORDER — FARXIGA 10 MG PO TABS: 10.0000 mg | ORAL_TABLET | Freq: Every day | ORAL | 6 refills | Status: DC

## 2023-03-14 MED ORDER — TORSEMIDE 10 MG PO TABS: 10.0000 mg | ORAL_TABLET | ORAL | 6 refills | Status: DC | PRN

## 2023-03-14 MED ORDER — EZETIMIBE 10 MG PO TABS: 10.0000 mg | ORAL_TABLET | Freq: Every day | ORAL | 6 refills | Status: DC

## 2023-03-14 MED ORDER — SPIRONOLACTONE 25 MG PO TABS: 25.0000 mg | ORAL_TABLET | Freq: Every day | ORAL | 6 refills | Status: DC

## 2023-03-14 MED ORDER — ATORVASTATIN CALCIUM 80 MG PO TABS: 80.0000 mg | ORAL_TABLET | Freq: Every day | ORAL | 6 refills | Status: DC

## 2023-03-14 MED ORDER — CARVEDILOL 12.5 MG PO TABS: 12.5000 mg | ORAL_TABLET | Freq: Two times a day (BID) | ORAL | 6 refills | Status: DC

## 2023-03-14 MED ORDER — SPIRONOLACTONE 25 MG PO TABS: 25.0000 mg | ORAL_TABLET | Freq: Every day | ORAL | 6 refills | Status: AC

## 2023-03-14 MED ORDER — APIXABAN 5 MG PO TABS: 5.0000 mg | ORAL_TABLET | Freq: Two times a day (BID) | ORAL | 6 refills | Status: DC

## 2023-03-14 NOTE — Patient Instructions (Addendum)
CHANGE Eliquis to 5 mg twice a day  As requested all cardiac medications prescriptions have been printed. Please take prescriptions to a pharmacy of your choice  Labs today We will only contact you if something comes back abnormal or we need to make some changes. Otherwise no news is good news!  Your physician wants you to follow-up in: 6 months  in the Advanced Practitioners (PA/NP) Clinic   You will receive a reminder letter in the mail two months in advance. If you don't receive a letter, please call our office to schedule the follow-up appointment.   Do the following things EVERYDAY: Weigh yourself in the morning before breakfast. Write it down and keep it in a log. Take your medicines as prescribed Eat low salt foods--Limit salt (sodium) to 2000 mg per day.  Stay as active as you can everyday Limit all fluids for the day to less than 2 liters  At the [facility] Clinic, you and your health needs are our priority. As part of our continuing mission to provide you with exceptional heart care, we have created designated Provider Care Teams. These Care Teams include your primary Cardiologist (physician) and Advanced Practice Providers (APPs- Physician Assistants and Nurse Practitioners) who all work together to provide you with the care you need, when you need it.   You may see any of the following providers on your designated Care Team at your next follow up: Dr Arvilla Meres Dr Marca Ancona Dr. Marcos Eke, NP Robbie Lis, [place] [facility] [place], [place] Brynda Peon, NP Karle Plumber, PharmD   Please be sure to bring in all your medications bottles to every appointment.    Thank you for choosing [facility]   If you have any questions or concerns before your next appointment please send [place] a message through [place] or call our office at [phone].    TO LEAVE A MESSAGE FOR THE NURSE SELECT  OPTION 2, PLEASE LEAVE A MESSAGE INCLUDING: YOUR NAME DATE OF BIRTH CALL BACK NUMBER REASON FOR CALL**this is important as we prioritize the call backs  YOU WILL RECEIVE A CALL BACK THE SAME DAY AS LONG AS YOU CALL BEFORE 4:00 PM

## 2023-03-14 NOTE — Progress Notes (Signed)
PCP: Tally Joe, MD Cardiology: Dr. Duke Salvia EP: Dr. Johney Frame HF Cardiology: Dr. Shirlee Latch  77 y.o. with history of COVID-19 PNA, atrial fibrillation, and CHF was referred by Dr. Duke Salvia for evaluation of CHF.  Patient has history of renal cell carcinoma with right nephrectomy and diabetes, has CKD stage 3 as well.  He was feeling good until 1/21, when he developed COVID-19 PNA.  He was not hospitalized, but had a severe bout with it.  In 2/21, he was admitted with GI bleeding from gastric ulcers and new atrial fibrillation. He had EGD with clipping of ulcers.  Eventually, he was started on Eliquis and had DCCV to NSR.  He additionally was noted to have RLE DVT in 2/21.  Echo in 2/21 hospitalization showed EF 25-30%.  Lexiscan Cardiolite showed inferior scar versus artifact and no ischemia.  Repeat echo in 3/21 showed EF still 30-35%.   He had ERAF after 3/21 DCCV and was re-admitted in 5/21 for Tikosyn initiation.  He had repeat DCCV to NSR this admission.  He was only on Tikosyn 125 mcg bid at time of discharge.  He went back into atrial fibrillation again and I cardioverted him to NSR later in 5/21.   Echo in 6/21 showed improved EF 40-45%, RV normal.    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  Tikosyn was stopped, Coreg was increased to 12.5 mg bid.    TEE in 8/21 showed EF 35-40%, global HK, mildly decreased RV function, no LAA thrombus.  Patient was cardioverted to NSR.   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    In 3/22, he had atrial fibrillation ablation. TEE at that time showed EF 30-35%, moderate RV dysfunction.   Echo in 8/22 showed EF 40-45%, diffuse hypokinesis, normal RV.  Echo 02/24, EF remains 40-45%, mild LV dilation, mildly decreased RV systolic function.   He is here today for HF follow-up.  Doing well from HF perspective.  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 No dyspnea, orthopnea, PND or lower extremity edema. Has been taking medications as prescribed except for Eliquis which he has just been taking once a day d/t bruising and occasional nosebleeds.  No tobacco (quit 1999), drinks a beer on occasion.   Labs (5/21): K 4.5, creatinine 1.73 => 1.9, digoxin 0.9 Labs (6/21): K 5.2, creatinine 1.76 Labs (8/21): K 4.8, creatinine 1.9 Labs (5/22): digoxin 0.6, K 4.5, creatinine 2.09, hgb 14.2, LDL 94, BNP 461 Labs (7/22): K 4.8, creatinine 2.03 Labs (10/22): K 4.5, creatinine 2.06 Labs (12/22): K 4.9, creatinine 2.45 Labs (5/23): K 5.3, creatinine 1.77, LDL 84 Labs (7/23): K 4.6, creatinine 2.42 Labs (9/23): LDL 81 Labs (02/24): Cr 1.82, K 4.6, LDL 54, Hgb 13.7  PMH: 1. Atrial fibrillation: Paroxysmal.  - DCCV to NSR in 3/21, ERAF.   - Admitted in 5/21 for Tikosyn intiation and repeat DCCV to NSR.  - LAA thrombus in 7/21, atrial fibrillation ablation not done and Tikosyn stopped.  - Amiodarone started, repeat TEE in 8/21 with no thrombus and DCCV to NSR.  - Atrial fibrillation ablation 3/22 2. 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 Renal cell carcinoma s/p right nephrectomy 4. CKD: Stage 3. 5. Hyperlipidemia.  6. H/o COVID-19 PNA in 1/21.  7. H/o GI bleeding from gastric ulcers in 3/21.  8. Cardiomyopathy: Echo (2/21) with EF 25-30%.  - Cardiolite (2/21) with inferior scar, no ischemia.  - Echo (3/21): EF 30-35%, global hypokinesis, mild MR.  - Echo (6/21): EF 40-45%, RV normal size and systolic function, PASP 36, IVC dilated.  - TEE (8/21): EF 35-40%, diffuse hypokinesis, mildly decreased RV systolic function.  - LHC/RHC (10/21): 40% D2 stenosis, 40% PDA stenosis; mean RA 18, PA 51/23 mean 32, mean PCWP 22, CI 2.2 Thermo/1.9 Fick, PVR 1.9 WU.  - TEE (2/22): EF 30-35%, diffuse hypokinesis, moderately decreased RV systolic  function.  - Echo (8/22): EF 40-45%, diffuse hypokinesis, normal RV.  - Echo (2/24): EF 40-45%, mild LV dilation, mildly decreased RV systolic function.  9. RLE DVT in 2/21 post-COVID-19 PNA.   Social History   Socioeconomic History   Marital status: Married    Spouse name: Not on file   Number of children: Not on file   Years of education: Not on file   Highest education level: Not on file  Occupational History   Occupation: truck driver  Tobacco Use   Smoking status: Former    Packs/day: 2.00    Years: 35.00    Additional pack years: 0.00    Total pack years: 70.00    Types: Cigarettes    Quit date: 2000    Years since quitting: 24.4   Smokeless tobacco: Never  Vaping Use   Vaping Use: Never used  Substance and Sexual Activity   Alcohol use: Yes    Alcohol/week: 1.0 - 2.0 standard drink of alcohol    Types: 1 - 2 Cans of beer per week    Comment: Socially   Drug use: No   Sexual activity: Not on file  Other Topics Concern   Not on file  Social History Narrative   Lives with wife in a one story home.  Has 3 children.     Works as a Naval architect.     Education: 12th grade.   Social Determinants of Health   Financial Resource Strain: Not on file  Food Insecurity: Not on file  Transportation Needs: Not on file  Physical Activity: Not on file  Stress: Not on file  Social Connections: Not on file  Intimate Partner Violence: Not on file   Family History  Problem Relation Age of Onset   Cancer Mother    Heart attack Father    Heart attack Brother    Heart disease Sister    Stroke Paternal Grandmother    ROS: All systems reviewed and negative except as per HPI.   Current Outpatient Medications  Medication Sig Dispense Refill   ACCU-CHEK GUIDE test strip daily. for testing     Accu-Chek Softclix Lancets lancets      acetaminophen (TYLENOL) 500 MG tablet Take 1,000 mg by mouth every 6 (six) hours as needed for mild pain or headache.     ascorbic acid (VITAMIN  C) 1000 MG tablet Take 1,000 mg by mouth every evening.      Blood Glucose Monitoring Suppl (ACCU-CHEK GUIDE ME) w/Device KIT See admin instructions.     Garlic 1000 MG CAPS Take 1,000 mg by mouth every evening.     Glucosamine HCl 1000 MG TABS Take 1,000 mg by mouth every evening.     Krill Oil 500 MG CAPS Take 1 capsule by mouth daily.     Multiple Vitamin (MULTIVITAMIN WITH MINERALS) TABS tablet Take 1 tablet by mouth daily with breakfast.      Omega-3 Fatty  Acids (FISH OIL PO) Take 350 mg by mouth every evening.      Saw Palmetto, Serenoa repens, (SAW PALMETTO PO) Take 540 mg by mouth every evening.      sildenafil (VIAGRA) 50 MG tablet Take 1 tablet (50 mg total) by mouth daily as needed for erectile dysfunction. 10 tablet 3   apixaban (ELIQUIS) 5 MG TABS tablet Take 1 tablet (5 mg total) by mouth 2 (two) times daily. 60 tablet 6   atorvastatin (LIPITOR) 80 MG tablet Take 1 tablet (80 mg total) by mouth daily. 60 tablet 6   carvedilol (COREG) 12.5 MG tablet Take 1 tablet (12.5 mg total) by mouth in the morning and at bedtime. 60 tablet 6   ezetimibe (ZETIA) 10 MG tablet Take 1 tablet (10 mg total) by mouth daily. 30 tablet 6   FARXIGA 10 MG TABS tablet Take 1 tablet (10 mg total) by mouth daily. 30 tablet 6   sacubitril-valsartan (ENTRESTO) 24-26 MG Take 1 tablet by mouth 2 (two) times daily. 60 tablet 6   spironolactone (ALDACTONE) 25 MG tablet Take 1 tablet (25 mg total) by mouth daily. 30 tablet 6   torsemide (DEMADEX) 10 MG tablet Take 1 tablet (10 mg total) by mouth as needed (swelling fluid). 30 tablet 6   No current facility-administered medications for this encounter.   BP 116/70   Pulse (!) 59   Wt 116.3 kg (256 lb 6.4 oz)   SpO2 95%   BMI 33.83 kg/m  General:  Well appearing. Wife present. HEENT: normal Neck: supple. no JVD. Carotids 2+ bilat; no bruits.  Cor: PMI nondisplaced. Regular rate & rhythm. No rubs, gallops or murmurs. Lungs: clear Abdomen: soft, nontender,  nondistended.  Extremities: no cyanosis, clubbing, rash, edema Neuro: alert & orientedx3. Affect pleasant   Assessment/Plan: 1. Chronic systolic CHF: Nonischemic cardiomyopathy.  Echo in 2/21 with EF 25-30%, echo in 3/21 with EF 30-35%.  Cardiolite with inferior scar vs artifact, no ischemia. Cath in 10/21 with nonobstructive CAD.  Cause of cardiomyopathy is uncertain.  Could be related to atrial fibrillation (though not generally in RVR), cannot rule out cardiomyopathy related to myocarditis (?COVID-19 myocarditis).  Repeat echo in 6/21 showed EF up to 40-45%.  TEE in 8/21 with EF 35-40%.  TEE in 3/22 with EF 30-35% and moderate RV dysfunction.  Echo in 8/22 showed EF 40-45%.  Echo 02/24 with EF 40-45%. - NYHA class I-II symptoms. Volume looks good on exam. Not needing Torsemide regularly.  - Medication titration has been limited by soft blood pressure and CKD. SBP 116 today, but in 100s generally.  - Continue Coreg 12.5 mg bid.  - Continue Entresto 24/26 bid. Do not think he would tolerate higher dose. - Continue spironolactone 25 mg daily.  - Continue dapagliflozin 10 mg daily.   - Patient and wife wondered if he could come off any of his HF medications. Reviewed indications for 4 pillars of GDMT today. - EF is out of range for ICD.  Not CRT candidate with narrow QRS.   2. Atrial fibrillation:  DCCV x 2 on Tikosyn and DCCV x 1 on amiodarone.  He finally had atrial fibrillation ablation in 3/22.  He is off amiodarone.  - Sinus brady on ECG today. - Continue Eliquis, needs to take BID (only taking qd).  3. CKD: Stage 3b.   - Labs today - Avoid NSAIDs 4. Hyperlipidemia: Nonobstructive CAD, goal LDL < 70.  - Continue atorvastatin, LDL 54 in 02/24   Follow-up  4 months with APP  [facility], Amarri Michaelson N 03/14/2023

## 2023-03-15 ENCOUNTER — Encounter (HOSPITAL_COMMUNITY): Payer: Self-pay | Admitting: Cardiology

## 2023-03-15 MED ORDER — ENTRESTO 24-26 MG PO TABS: 1.0000 | ORAL_TABLET | Freq: Two times a day (BID) | ORAL | 6 refills | Status: DC

## 2023-03-25 DIAGNOSIS — I4891 Unspecified atrial fibrillation: Secondary | ICD-10-CM | POA: Diagnosis not present

## 2023-03-25 DIAGNOSIS — N529 Male erectile dysfunction, unspecified: Secondary | ICD-10-CM | POA: Diagnosis not present

## 2023-03-25 DIAGNOSIS — Z8719 Personal history of other diseases of the digestive system: Secondary | ICD-10-CM | POA: Diagnosis not present

## 2023-03-25 DIAGNOSIS — N401 Enlarged prostate with lower urinary tract symptoms: Secondary | ICD-10-CM | POA: Diagnosis not present

## 2023-03-25 DIAGNOSIS — N1832 Chronic kidney disease, stage 3b: Secondary | ICD-10-CM | POA: Diagnosis not present

## 2023-03-25 DIAGNOSIS — K219 Gastro-esophageal reflux disease without esophagitis: Secondary | ICD-10-CM | POA: Diagnosis not present

## 2023-03-25 DIAGNOSIS — E7800 Pure hypercholesterolemia, unspecified: Secondary | ICD-10-CM | POA: Diagnosis not present

## 2023-03-25 DIAGNOSIS — I82431 Acute embolism and thrombosis of right popliteal vein: Secondary | ICD-10-CM | POA: Diagnosis not present

## 2023-03-25 DIAGNOSIS — E1169 Type 2 diabetes mellitus with other specified complication: Secondary | ICD-10-CM | POA: Diagnosis not present

## 2023-03-25 DIAGNOSIS — D6869 Other thrombophilia: Secondary | ICD-10-CM | POA: Diagnosis not present

## 2023-03-25 DIAGNOSIS — D696 Thrombocytopenia, unspecified: Secondary | ICD-10-CM | POA: Diagnosis not present

## 2023-03-25 DIAGNOSIS — I5022 Chronic systolic (congestive) heart failure: Secondary | ICD-10-CM | POA: Diagnosis not present

## 2023-04-20 ENCOUNTER — Other Ambulatory Visit (HOSPITAL_COMMUNITY): Payer: Self-pay | Admitting: Physician Assistant

## 2023-05-13 DIAGNOSIS — Z85528 Personal history of other malignant neoplasm of kidney: Secondary | ICD-10-CM | POA: Diagnosis not present

## 2023-05-13 DIAGNOSIS — N471 Phimosis: Secondary | ICD-10-CM | POA: Diagnosis not present

## 2023-05-13 DIAGNOSIS — R350 Frequency of micturition: Secondary | ICD-10-CM | POA: Diagnosis not present

## 2023-05-13 DIAGNOSIS — N401 Enlarged prostate with lower urinary tract symptoms: Secondary | ICD-10-CM | POA: Diagnosis not present

## 2023-10-03 ENCOUNTER — Other Ambulatory Visit (HOSPITAL_COMMUNITY): Payer: Self-pay | Admitting: Physician Assistant

## 2023-10-03 ENCOUNTER — Encounter (HOSPITAL_COMMUNITY): Payer: Self-pay | Admitting: Cardiology

## 2023-10-04 ENCOUNTER — Other Ambulatory Visit (HOSPITAL_COMMUNITY): Payer: Self-pay

## 2023-10-04 MED ORDER — ENTRESTO 24-26 MG PO TABS: 1.0000 | ORAL_TABLET | Freq: Two times a day (BID) | ORAL | 11 refills | Status: AC

## 2023-10-04 MED ORDER — APIXABAN 5 MG PO TABS: 5.0000 mg | ORAL_TABLET | Freq: Two times a day (BID) | ORAL | 11 refills | Status: DC

## 2023-10-04 MED ORDER — APIXABAN 5 MG PO TABS: 5.0000 mg | ORAL_TABLET | Freq: Two times a day (BID) | ORAL | 11 refills | Status: AC

## 2023-10-22 ENCOUNTER — Other Ambulatory Visit (HOSPITAL_COMMUNITY): Payer: Self-pay | Admitting: Physician Assistant

## 2023-11-04 DIAGNOSIS — N529 Male erectile dysfunction, unspecified: Secondary | ICD-10-CM | POA: Diagnosis not present

## 2023-11-04 DIAGNOSIS — I5022 Chronic systolic (congestive) heart failure: Secondary | ICD-10-CM | POA: Diagnosis not present

## 2023-11-04 DIAGNOSIS — Z23 Encounter for immunization: Secondary | ICD-10-CM | POA: Diagnosis not present

## 2023-11-04 DIAGNOSIS — N401 Enlarged prostate with lower urinary tract symptoms: Secondary | ICD-10-CM | POA: Diagnosis not present

## 2023-11-04 DIAGNOSIS — E7800 Pure hypercholesterolemia, unspecified: Secondary | ICD-10-CM | POA: Diagnosis not present

## 2023-11-04 DIAGNOSIS — I4891 Unspecified atrial fibrillation: Secondary | ICD-10-CM | POA: Diagnosis not present

## 2023-11-04 DIAGNOSIS — E1169 Type 2 diabetes mellitus with other specified complication: Secondary | ICD-10-CM | POA: Diagnosis not present

## 2023-11-04 DIAGNOSIS — K219 Gastro-esophageal reflux disease without esophagitis: Secondary | ICD-10-CM | POA: Diagnosis not present

## 2023-11-04 DIAGNOSIS — Z0000 Encounter for general adult medical examination without abnormal findings: Secondary | ICD-10-CM | POA: Diagnosis not present

## 2023-11-04 DIAGNOSIS — D696 Thrombocytopenia, unspecified: Secondary | ICD-10-CM | POA: Diagnosis not present

## 2023-11-04 DIAGNOSIS — N1832 Chronic kidney disease, stage 3b: Secondary | ICD-10-CM | POA: Diagnosis not present

## 2023-11-04 DIAGNOSIS — Z8719 Personal history of other diseases of the digestive system: Secondary | ICD-10-CM | POA: Diagnosis not present

## 2023-11-17 ENCOUNTER — Other Ambulatory Visit (HOSPITAL_COMMUNITY): Payer: Self-pay | Admitting: Physician Assistant

## 2024-01-21 ENCOUNTER — Telehealth (HOSPITAL_COMMUNITY): Payer: Self-pay | Admitting: *Deleted

## 2024-01-21 NOTE — Telephone Encounter (Signed)
 Called to confirm/remind patient of their appointment at the [facility] on 01/10/24***.   Appointment:   [x] Confirmed  [] Left mess   [] No answer/No voice mail  [] Phone not in service  Patient reminded to bring all medications and/or complete list.  Confirmed patient has transportation. Gave directions, instructed to utilize valet parking.

## 2024-01-22 ENCOUNTER — Encounter (HOSPITAL_COMMUNITY): Payer: Self-pay

## 2024-01-22 ENCOUNTER — Ambulatory Visit (HOSPITAL_COMMUNITY)
Admission: RE | Admit: 2024-01-22 | Discharge: 2024-01-22 | Disposition: A | Discharge status: Home / Self Care | Source: Ambulatory Visit | Attending: Cardiology | Admitting: Cardiology

## 2024-01-22 VITALS — BP 108/62 | HR 53 | Wt 250.2 lb

## 2024-01-22 DIAGNOSIS — I428 Other cardiomyopathies: Secondary | ICD-10-CM | POA: Insufficient documentation

## 2024-01-22 DIAGNOSIS — Z87891 Personal history of nicotine dependence: Secondary | ICD-10-CM | POA: Insufficient documentation

## 2024-01-22 DIAGNOSIS — Z7984 Long term (current) use of oral hypoglycemic drugs: Secondary | ICD-10-CM | POA: Diagnosis not present

## 2024-01-22 DIAGNOSIS — I480 Paroxysmal atrial fibrillation: Secondary | ICD-10-CM | POA: Diagnosis not present

## 2024-01-22 DIAGNOSIS — Z7901 Long term (current) use of anticoagulants: Secondary | ICD-10-CM | POA: Insufficient documentation

## 2024-01-22 DIAGNOSIS — I5042 Chronic combined systolic (congestive) and diastolic (congestive) heart failure: Secondary | ICD-10-CM

## 2024-01-22 DIAGNOSIS — E1122 Type 2 diabetes mellitus with diabetic chronic kidney disease: Secondary | ICD-10-CM | POA: Diagnosis not present

## 2024-01-22 DIAGNOSIS — N1832 Chronic kidney disease, stage 3b: Secondary | ICD-10-CM | POA: Insufficient documentation

## 2024-01-22 DIAGNOSIS — Z8616 Personal history of COVID-19: Secondary | ICD-10-CM | POA: Diagnosis not present

## 2024-01-22 DIAGNOSIS — Z79899 Other long term (current) drug therapy: Secondary | ICD-10-CM | POA: Insufficient documentation

## 2024-01-22 DIAGNOSIS — I2510 Atherosclerotic heart disease of native coronary artery without angina pectoris: Secondary | ICD-10-CM | POA: Insufficient documentation

## 2024-01-22 LAB — CBC
HCT: 42.5 % (ref 39.0–52.0)
Hemoglobin: 13.8 g/dL (ref 13.0–17.0)
MCH: 32.1 pg (ref 26.0–34.0)
MCHC: 32.5 g/dL (ref 30.0–36.0)
MCV: 98.8 fL (ref 80.0–100.0)
Platelets: 158 10*3/uL (ref 150–400)
RBC: 4.3 MIL/uL (ref 4.22–5.81)
RDW: 13.2 % (ref 11.5–15.5)
WBC: 7.8 10*3/uL (ref 4.0–10.5)
nRBC: 0 % (ref 0.0–0.2)

## 2024-01-22 LAB — LIPID PANEL
Cholesterol: 95 mg/dL (ref 0–200)
HDL: 38 mg/dL — ABNORMAL LOW (ref >40)
LDL Cholesterol: 43 mg/dL (ref 0–99)
Total CHOL/HDL Ratio: 2.5 ratio
Triglycerides: 72 mg/dL (ref <150)
VLDL: 14 mg/dL (ref 0–40)

## 2024-01-22 LAB — COMPREHENSIVE METABOLIC PANEL WITH GFR
ALT: 21 U/L (ref 0–44)
AST: 22 U/L (ref 15–41)
Albumin: 3.7 g/dL (ref 3.5–5.0)
Alkaline Phosphatase: 67 U/L (ref 38–126)
Anion gap: 9 (ref 5–15)
BUN: 25 mg/dL — ABNORMAL HIGH (ref 8–23)
CO2: 25 mmol/L (ref 22–32)
Calcium: 9.1 mg/dL (ref 8.9–10.3)
Chloride: 104 mmol/L (ref 98–111)
Creatinine, Ser: 1.97 mg/dL — ABNORMAL HIGH (ref 0.61–1.24)
GFR, Estimated: 35 mL/min — ABNORMAL LOW (ref >60)
Glucose, Bld: 107 mg/dL — ABNORMAL HIGH (ref 70–99)
Potassium: 4.9 mmol/L (ref 3.5–5.1)
Sodium: 138 mmol/L (ref 135–145)
Total Bilirubin: 1.4 mg/dL — ABNORMAL HIGH (ref 0.0–1.2)
Total Protein: 6.3 g/dL — ABNORMAL LOW (ref 6.5–8.1)

## 2024-01-22 NOTE — Progress Notes (Signed)
 [facility] Progress Note   PCP: Tally Joe, MD Cardiology: Dr. Duke Salvia EP: Dr. Johney Frame HF Cardiology: Dr. Shirlee Latch  Reason for Visit: f/u for chronic systolic heart failure   78 y.o. with history of COVID-19 PNA, atrial fibrillation, and CHF was referred by Dr. Duke Salvia for evaluation of CHF.  Patient has history of renal cell carcinoma with right nephrectomy and diabetes, has CKD stage 3 as well.  He was feeling good until 1/21, when he developed COVID-19 PNA.  He was not hospitalized, but had a severe bout with it.  In 2/21, he was admitted with GI bleeding from gastric ulcers and new atrial fibrillation. He had EGD with clipping of ulcers.  Eventually, he was started on Eliquis and had DCCV to NSR.  He additionally was noted to have RLE DVT in 2/21.  Echo in 2/21 hospitalization showed EF 25-30%.  Lexiscan Cardiolite showed inferior scar versus artifact and no ischemia.  Repeat echo in 3/21 showed EF still 30-35%.   He had ERAF after 3/21 DCCV and was re-admitted in 5/21 for Tikosyn initiation.  He had repeat DCCV to NSR this admission.  He was only on Tikosyn 125 mcg bid at time of discharge.  He went back into atrial fibrillation again and he was cardioverted to NSR later in 5/21.   Echo in 6/21 showed improved EF 40-45%, RV normal.    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  Tikosyn was stopped, Coreg was increased to 12.5 mg bid.    TEE in 8/21 showed EF 35-40%, global HK, mildly decreased RV function, no LAA thrombus.  Patient was cardioverted to NSR.   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    In 3/22, he had atrial fibrillation ablation. TEE at that time showed EF 30-35%, moderate RV dysfunction.   Echo in 8/22 showed EF 40-45%, diffuse hypokinesis, normal RV.   Echo 02/24, EF remains 40-45%, mild LV dilation,  mildly decreased RV systolic function.   Here today for f/u. Here w/ his wife. Doing well. Denies CP. No dyspnea w/ basic ADLs. Still works as Naval architect. EKG shows sinus brady 53 bpm. BP 108/62. Denies fatigue. No orthostatic symptoms. Compliant w/ medications. Notes bruising but no other abnormal bleeding w/ Eliquis.    Labs (5/21): K 4.5, creatinine 1.73 => 1.9, digoxin 0.9 Labs (6/21): K 5.2, creatinine 1.76 Labs (8/21): K 4.8, creatinine 1.9 Labs (5/22): digoxin 0.6, K 4.5, creatinine 2.09, hgb 14.2, LDL 94, BNP 461 Labs (7/22): K 4.8, creatinine 2.03 Labs (10/22): K 4.5, creatinine 2.06 Labs (12/22): K 4.9, creatinine 2.45 Labs (5/23): K 5.3, creatinine 1.77, LDL 84 Labs (7/23): K 4.6, creatinine 2.42 Labs (9/23): LDL 81 Labs (02/24): Cr 1.82, K 4.6, LDL 54, Hgb 13.7  PMH: 1. Atrial fibrillation: Paroxysmal.  - DCCV to NSR in 3/21, ERAF.   - Admitted in 5/21 for Tikosyn intiation and repeat DCCV to NSR.  - LAA thrombus in 7/21, atrial fibrillation ablation not done and Tikosyn stopped.  - Amiodarone started, repeat TEE in 8/21 with no thrombus and DCCV to NSR.  - Atrial fibrillation ablation 3/22 2. 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 Renal cell carcinoma s/p right nephrectomy 4. CKD: Stage 3. 5. Hyperlipidemia.  6. H/o COVID-19 PNA in 1/21.  7. H/o GI bleeding from gastric ulcers in 3/21.  8. Cardiomyopathy: Echo (  2/21) with EF 25-30%.  - Cardiolite (2/21) with inferior scar, no ischemia.  - Echo (3/21): EF 30-35%, global hypokinesis, mild MR.  - Echo (6/21): EF 40-45%, RV normal size and systolic function, PASP 36, IVC dilated.  - TEE (8/21): EF 35-40%, diffuse hypokinesis, mildly decreased RV systolic function.  - LHC/RHC (10/21): 40% D2 stenosis, 40% PDA stenosis; mean RA 18, PA 51/23 mean 32, mean PCWP 22, CI 2.2 Thermo/1.9 Fick, PVR 1.9 WU.  - TEE (2/22): EF 30-35%, diffuse hypokinesis, moderately decreased RV systolic function.  - Echo (8/22): EF 40-45%,  diffuse hypokinesis, normal RV.  - Echo (2/24): EF 40-45%, mild LV dilation, mildly decreased RV systolic function.  9. RLE DVT in 2/21 post-COVID-19 PNA.   Social History   Socioeconomic History   Marital status: Married    Spouse name: Not on file   Number of children: Not on file   Years of education: Not on file   Highest education level: Not on file  Occupational History   Occupation: truck driver  Tobacco Use   Smoking status: Former    Current packs/day: 0.00    Average packs/day: 2.0 packs/day for 35.0 years (70.0 ttl pk-yrs)    Types: Cigarettes    Start date: 86    Quit date: 2000    Years since quitting: 25.3   Smokeless tobacco: Never  Vaping Use   Vaping status: Never Used  Substance and Sexual Activity   Alcohol use: Yes    Alcohol/week: 1.0 - 2.0 standard drink of alcohol    Types: 1 - 2 Cans of beer per week    Comment: Socially   Drug use: No   Sexual activity: Not on file  Other Topics Concern   Not on file  Social History Narrative   Lives with wife in a one story home.  Has 3 children.     Works as a Naval architect.     Education: 12th grade.   Social Drivers of Corporate investment banker Strain: Not on file  Food Insecurity: Not on file  Transportation Needs: Not on file  Physical Activity: Not on file  Stress: Not on file  Social Connections: Not on file  Intimate Partner Violence: Not on file   Family History  Problem Relation Age of Onset   Cancer Mother    Heart attack Father    Heart attack Brother    Heart disease Sister    Stroke Paternal Grandmother    ROS: All systems reviewed and negative except as per HPI.   Current Outpatient Medications  Medication Sig Dispense Refill   ACCU-CHEK GUIDE test strip daily. for testing     Accu-Chek Softclix Lancets lancets      acetaminophen (TYLENOL) 500 MG tablet Take 1,000 mg by mouth every 6 (six) hours as needed for mild pain or headache.     apixaban (ELIQUIS) 5 MG TABS tablet Take  1 tablet (5 mg total) by mouth 2 (two) times daily. 60 tablet 11   ascorbic acid (VITAMIN C) 1000 MG tablet Take 1,000 mg by mouth every evening.      atorvastatin (LIPITOR) 80 MG tablet Take 1 tablet (80 mg total) by mouth daily. NEEDS FOLLOW UP APPOINTMENT FOR MORE REFILLS 30 tablet 0   Blood Glucose Monitoring Suppl (ACCU-CHEK GUIDE ME) w/Device KIT See admin instructions.     carvedilol (COREG) 12.5 MG tablet Take 1 tablet (12.5 mg total) by mouth 2 (two) times daily. NEEDS FOLLOW UP APPOINTMENT  FOR MORE REFILLS 180 tablet 0   ezetimibe (ZETIA) 10 MG tablet Take 1 tablet (10 mg total) by mouth daily. 30 tablet 6   FARXIGA 10 MG TABS tablet TAKE 1 TABLET BY MOUTH EVERY DAY 30 tablet 6   Garlic 1000 MG CAPS Take 1,000 mg by mouth every evening.     Glucosamine HCl 1000 MG TABS Take 1,000 mg by mouth every evening.     Krill Oil 500 MG CAPS Take 1 capsule by mouth daily.     Multiple Vitamin (MULTIVITAMIN WITH MINERALS) TABS tablet Take 1 tablet by mouth daily with breakfast.      Omega-3 Fatty Acids (FISH OIL PO) Take 350 mg by mouth every evening.      sacubitril-valsartan (ENTRESTO) 24-26 MG Take 1 tablet by mouth 2 (two) times daily. 60 tablet 11   Saw Palmetto, Serenoa repens, (SAW PALMETTO PO) Take 540 mg by mouth every evening.      sildenafil (VIAGRA) 50 MG tablet Take 1 tablet (50 mg total) by mouth daily as needed for erectile dysfunction. 10 tablet 3   spironolactone (ALDACTONE) 25 MG tablet Take 1 tablet (25 mg total) by mouth daily. 30 tablet 6   torsemide (DEMADEX) 10 MG tablet TAKE 1 TABLET BY MOUTH AS NEEDED FOR SWELLING OR EDEMA 30 tablet 6   No current facility-administered medications for this encounter.   BP 108/62   Pulse (!) 53   Wt 113.5 kg (250 lb 3.2 oz)   SpO2 96%   BMI 33.01 kg/m  PHYSICAL EXAM: General:  Well appearing. No respiratory difficulty HEENT: normal Neck: supple. no JVD. Carotids 2+ bilat; no bruits. No lymphadenopathy or thyromegaly  appreciated. Cor: PMI nondisplaced. Regular rate & rhythm. No rubs, gallops or murmurs. Lungs: clear Abdomen: soft, nontender, nondistended. No hepatosplenomegaly. No bruits or masses. Good bowel sounds. Extremities: no cyanosis, clubbing, rash, edema Neuro: alert & oriented x 3, cranial nerves grossly intact. moves all 4 extremities w/o difficulty. Affect pleasant.   Wt Readings from Last 3 Encounters:  01/22/24 113.5 kg (250 lb 3.2 oz)  03/14/23 116.3 kg (256 lb 6.4 oz)  11/14/22 116.5 kg (256 lb 12.8 oz)    Assessment/Plan: 1. Chronic systolic CHF: Nonischemic cardiomyopathy.  Echo in 2/21 with EF 25-30%, echo in 3/21 with EF 30-35%.  Cardiolite with inferior scar vs artifact, no ischemia. Cath in 10/21 with nonobstructive CAD.  Cause of cardiomyopathy is uncertain.  Could be related to atrial fibrillation (though not generally in RVR), cannot rule out cardiomyopathy related to myocarditis (?COVID-19 myocarditis).  Repeat echo in 6/21 showed EF up to 40-45%.  TEE in 8/21 with EF 35-40%.  TEE in 3/22 with EF 30-35% and moderate RV dysfunction.  Echo in 8/22 showed EF 40-45%.  Echo 02/24 with EF 40-45%. Stable NYHA Class I-II symptoms. Euvolemic on exam. Low BP and bradycardia limtis med titration today  - Continue Coreg 12.5 mg bid.  - Continue Entresto 24/26 bid.  - Continue spironolactone 25 mg daily.  - Continue dapagliflozin 10 mg daily.   - Torsemide 10 mg prn  - last EF is out of range for ICD.  Not CRT candidate with narrow QRS.   - Plan repeat echo in 6 months  - CMP today  2. Atrial fibrillation:  DCCV x 2 on Tikosyn and DCCV x 1 on amiodarone.  He finally had atrial fibrillation ablation in 3/22.  He is off amiodarone.  - Maintaining NSR/SB - Continue Coreg  - Continue Eliquis  5 mg bid. Check CBC today  - Continue Eliquis, needs to take BID (only taking qd).  3. CKD: Stage 3b.   - on Farxiga  - CMP today   4. Hyperlipidemia: Nonobstructive CAD, goal LDL < 70.  - Continue  atorvastatin - Check LP and HFT test today   F/u in 6 months w/ Echo w/ Dr. Jannette Mend, PA-C  01/22/2024

## 2024-01-22 NOTE — Patient Instructions (Addendum)
 Good to see you today!   No medication changes  Your physician has requested that you have an echocardiogram. Echocardiography is a painless test that uses sound waves to create images of your heart. It provides your doctor with information about the size and shape of your heart and how well your heart's chambers and valves are working. This procedure takes approximately one hour. There are no restrictions for this procedure. Please do NOT wear cologne, perfume, aftershave, or lotions (deodorant is allowed). Please arrive 15 minutes prior to your appointment time.  Please note: We ask at that you not bring children with you during ultrasound (echo/ vascular) testing. Due to room size and safety concerns, children are not allowed in the ultrasound rooms during exams. Our front office staff cannot provide observation of children in our lobby area while testing is being conducted. An adult accompanying a patient to their appointment will only be allowed in the ultrasound room at the discretion of the ultrasound technician under special circumstances. We apologize for any inconvenience.   Labs done today, your results will be available in MyChart, we will contact you for abnormal readings.  Your physician recommends that you schedule a follow-up appointment : 6 months with echocardiogram(October) Call office in August to schedule an appointment  If you have any questions or concerns before your next appointment please send us  a message through [place] or call our office at [phone].    TO LEAVE A MESSAGE FOR THE NURSE SELECT OPTION 2, PLEASE LEAVE A MESSAGE INCLUDING: YOUR NAME DATE OF BIRTH CALL BACK NUMBER REASON FOR CALL**this is important as we prioritize the call backs  YOU WILL RECEIVE A CALL BACK THE SAME DAY AS LONG AS YOU CALL BEFORE 4:00 PM At the [facility], you and your health needs are our priority. As part of our continuing mission to provide you with  exceptional heart care, we have created designated Provider Care Teams. These Care Teams include your primary Cardiologist (physician) and Advanced Practice Providers (APPs- Physician Assistants and Nurse Practitioners) who all work together to provide you with the care you need, when you need it.   You may see any of the following providers on your designated Care Team at your next follow up: Dr Jules Oar Dr Peder Bourdon Dr. Alwin Baars Dr. Arta Lark Amy Marijane Shoulders, NP Ruddy Corral, [place] [facility] [place], [place] Dennise Fitz, NP [place] Lee, NP Shawnee Dellen, NP Luster Salters, PharmD Bevely Brush, PharmD   Please be sure to bring in all your medications bottles to every appointment.    Thank you for choosing [facility]

## 2024-01-30 ENCOUNTER — Other Ambulatory Visit (HOSPITAL_COMMUNITY): Payer: Self-pay

## 2024-01-30 ENCOUNTER — Telehealth (HOSPITAL_COMMUNITY): Payer: Self-pay | Admitting: Pharmacy Technician

## 2024-01-30 NOTE — Telephone Encounter (Signed)
 [facility] Patient Advocate Encounter  Received PA renewal request for Farxiga . Test claim shows 90 day co-pay is $0. No further action needed at this time.  Correne Dillon, CPhT

## 2024-02-18 ENCOUNTER — Other Ambulatory Visit (HOSPITAL_COMMUNITY): Payer: Self-pay | Admitting: Physician Assistant

## 2024-03-03 ENCOUNTER — Other Ambulatory Visit (HOSPITAL_COMMUNITY): Payer: Self-pay

## 2024-03-06 ENCOUNTER — Telehealth (HOSPITAL_COMMUNITY): Payer: Self-pay | Admitting: Pharmacy Technician

## 2024-03-06 ENCOUNTER — Other Ambulatory Visit (HOSPITAL_COMMUNITY): Payer: Self-pay

## 2024-03-06 NOTE — Telephone Encounter (Signed)
 Advanced [facility] Failure Patient Advocate Encounter  Received PA request for Farxiga . Test claim shows the medication goes through [employer] insurance for $0. Called and spoke with the pharmacy. They reprocessed the claim with no issues.  Correne Dillon, CPhT

## 2024-03-09 ENCOUNTER — Other Ambulatory Visit (HOSPITAL_COMMUNITY): Payer: Self-pay

## 2024-05-01 DIAGNOSIS — N529 Male erectile dysfunction, unspecified: Secondary | ICD-10-CM | POA: Diagnosis not present

## 2024-05-01 DIAGNOSIS — I5022 Chronic systolic (congestive) heart failure: Secondary | ICD-10-CM | POA: Diagnosis not present

## 2024-05-01 DIAGNOSIS — N1832 Chronic kidney disease, stage 3b: Secondary | ICD-10-CM | POA: Diagnosis not present

## 2024-05-01 DIAGNOSIS — Z23 Encounter for immunization: Secondary | ICD-10-CM | POA: Diagnosis not present

## 2024-05-01 DIAGNOSIS — N401 Enlarged prostate with lower urinary tract symptoms: Secondary | ICD-10-CM | POA: Diagnosis not present

## 2024-05-01 DIAGNOSIS — E7800 Pure hypercholesterolemia, unspecified: Secondary | ICD-10-CM | POA: Diagnosis not present

## 2024-05-01 DIAGNOSIS — I4891 Unspecified atrial fibrillation: Secondary | ICD-10-CM | POA: Diagnosis not present

## 2024-05-01 DIAGNOSIS — M25552 Pain in left hip: Secondary | ICD-10-CM | POA: Diagnosis not present

## 2024-05-01 DIAGNOSIS — I82431 Acute embolism and thrombosis of right popliteal vein: Secondary | ICD-10-CM | POA: Diagnosis not present

## 2024-05-01 DIAGNOSIS — K219 Gastro-esophageal reflux disease without esophagitis: Secondary | ICD-10-CM | POA: Diagnosis not present

## 2024-05-01 DIAGNOSIS — E1169 Type 2 diabetes mellitus with other specified complication: Secondary | ICD-10-CM | POA: Diagnosis not present

## 2024-05-01 DIAGNOSIS — Z8719 Personal history of other diseases of the digestive system: Secondary | ICD-10-CM | POA: Diagnosis not present

## 2024-06-04 ENCOUNTER — Other Ambulatory Visit (HOSPITAL_COMMUNITY): Payer: Self-pay | Admitting: Physician Assistant

## 2024-06-26 ENCOUNTER — Telehealth (HOSPITAL_COMMUNITY): Payer: Self-pay

## 2024-06-26 NOTE — Telephone Encounter (Signed)
 Error

## 2024-07-02 ENCOUNTER — Other Ambulatory Visit (HOSPITAL_COMMUNITY): Payer: Self-pay

## 2024-07-02 MED ORDER — EZETIMIBE 10 MG PO TABS: 10.0000 mg | ORAL_TABLET | Freq: Every day | ORAL | 3 refills | Status: DC

## 2024-08-05 ENCOUNTER — Ambulatory Visit (HOSPITAL_BASED_OUTPATIENT_CLINIC_OR_DEPARTMENT_OTHER)
Admission: RE | Admit: 2024-08-05 | Discharge: 2024-08-05 | Disposition: A | Discharge status: Home / Self Care | Source: Ambulatory Visit | Attending: Cardiology | Admitting: Cardiology

## 2024-08-05 ENCOUNTER — Encounter (HOSPITAL_COMMUNITY): Payer: Self-pay | Admitting: Cardiology

## 2024-08-05 ENCOUNTER — Ambulatory Visit (HOSPITAL_COMMUNITY): Payer: Self-pay | Admitting: Cardiology

## 2024-08-05 ENCOUNTER — Ambulatory Visit (HOSPITAL_COMMUNITY)
Admission: RE | Admit: 2024-08-05 | Discharge: 2024-08-05 | Disposition: A | Discharge status: Home / Self Care | Source: Ambulatory Visit | Attending: Cardiology | Admitting: Cardiology

## 2024-08-05 VITALS — BP 98/64 | HR 53 | Ht 73.0 in | Wt 256.8 lb

## 2024-08-05 DIAGNOSIS — I5022 Chronic systolic (congestive) heart failure: Secondary | ICD-10-CM | POA: Insufficient documentation

## 2024-08-05 DIAGNOSIS — Z7984 Long term (current) use of oral hypoglycemic drugs: Secondary | ICD-10-CM | POA: Insufficient documentation

## 2024-08-05 DIAGNOSIS — Z85528 Personal history of other malignant neoplasm of kidney: Secondary | ICD-10-CM | POA: Diagnosis not present

## 2024-08-05 DIAGNOSIS — E785 Hyperlipidemia, unspecified: Secondary | ICD-10-CM | POA: Insufficient documentation

## 2024-08-05 DIAGNOSIS — Z905 Acquired absence of kidney: Secondary | ICD-10-CM | POA: Diagnosis not present

## 2024-08-05 DIAGNOSIS — I5042 Chronic combined systolic (congestive) and diastolic (congestive) heart failure: Secondary | ICD-10-CM

## 2024-08-05 DIAGNOSIS — Z7901 Long term (current) use of anticoagulants: Secondary | ICD-10-CM | POA: Diagnosis not present

## 2024-08-05 DIAGNOSIS — Z86718 Personal history of other venous thrombosis and embolism: Secondary | ICD-10-CM | POA: Insufficient documentation

## 2024-08-05 DIAGNOSIS — E1122 Type 2 diabetes mellitus with diabetic chronic kidney disease: Secondary | ICD-10-CM | POA: Diagnosis not present

## 2024-08-05 DIAGNOSIS — N183 Chronic kidney disease, stage 3 unspecified: Secondary | ICD-10-CM | POA: Diagnosis not present

## 2024-08-05 DIAGNOSIS — Z79899 Other long term (current) drug therapy: Secondary | ICD-10-CM | POA: Diagnosis not present

## 2024-08-05 DIAGNOSIS — I480 Paroxysmal atrial fibrillation: Secondary | ICD-10-CM | POA: Insufficient documentation

## 2024-08-05 DIAGNOSIS — I2510 Atherosclerotic heart disease of native coronary artery without angina pectoris: Secondary | ICD-10-CM | POA: Diagnosis not present

## 2024-08-05 DIAGNOSIS — I428 Other cardiomyopathies: Secondary | ICD-10-CM | POA: Diagnosis not present

## 2024-08-05 LAB — BASIC METABOLIC PANEL WITH GFR
Anion gap: 8 (ref 5–15)
BUN: 27 mg/dL — ABNORMAL HIGH (ref 8–23)
CO2: 22 mmol/L (ref 22–32)
Calcium: 8.9 mg/dL (ref 8.9–10.3)
Chloride: 107 mmol/L (ref 98–111)
Creatinine, Ser: 1.97 mg/dL — ABNORMAL HIGH (ref 0.61–1.24)
GFR, Estimated: 35 mL/min — ABNORMAL LOW (ref >60)
Glucose, Bld: 109 mg/dL — ABNORMAL HIGH (ref 70–99)
Potassium: 5.2 mmol/L — ABNORMAL HIGH (ref 3.5–5.1)
Sodium: 137 mmol/L (ref 135–145)

## 2024-08-05 LAB — ECHOCARDIOGRAM COMPLETE
Area-P 1/2: 2.99 cm2
Calc EF: 54.2 %
S' Lateral: 3.8 cm
Single Plane A2C EF: 55.5 %
Single Plane A4C EF: 52.8 %

## 2024-08-05 LAB — BRAIN NATRIURETIC PEPTIDE: B Natriuretic Peptide: 339.1 pg/mL — ABNORMAL HIGH (ref 0.0–100.0)

## 2024-08-05 LAB — HEMOGLOBIN A1C
Hgb A1c MFr Bld: 5.9 % — ABNORMAL HIGH (ref 4.8–5.6)
Mean Plasma Glucose: 122.63 mg/dL

## 2024-08-05 NOTE — Patient Instructions (Signed)
 Medication Changes:  No Changes In Medications at this time.   Lab Work:  Labs done today, your results will be available in MyChart, we will contact you for abnormal readings.  Follow-Up in: 4 months as scheduled with APP clinic   At the [facility], you and your health needs are our priority. We have a designated team specialized in the treatment of Heart Failure. This Care Team includes your primary Heart Failure Specialized Cardiologist (physician), Advanced Practice Providers (APPs- Physician Assistants and Nurse Practitioners), and Pharmacist who all work together to provide you with the care you need, when you need it.   You may see any of the following providers on your designated Care Team at your next follow up:  Dr. Toribio Fuel Dr. Ezra Shuck Dr. Odis Brownie Greig Mosses, NP Caffie Shed, [place] [facility] [place], [place] Beckey Coe, NP Jordan Lee, NP Tinnie Redman, PharmD   Please be sure to bring in all your medications bottles to every appointment.   Need to Contact Us :  If you have any questions or concerns before your next appointment please send us  a message through [place] or call our office at [phone].    TO LEAVE A MESSAGE FOR THE NURSE SELECT OPTION 2, PLEASE LEAVE A MESSAGE INCLUDING: YOUR NAME DATE OF BIRTH CALL BACK NUMBER REASON FOR CALL**this is important as we prioritize the call backs  YOU WILL RECEIVE A CALL BACK THE SAME DAY AS LONG AS YOU CALL BEFORE 4:00 PM

## 2024-08-05 NOTE — Progress Notes (Signed)
 [facility] Progress Note   PCP: Seabron Lenis, MD EP: Dr. Kelsie HF Cardiology: Dr. Rolan  Chief complaint: CHF  78 y.o. with history of COVID-19 PNA, atrial fibrillation, and CHF was referred by Dr. Raford for evaluation of CHF.  Patient has history of renal cell carcinoma with right nephrectomy and diabetes, has CKD stage 3 as well.  He was feeling good until 1/21, when he developed COVID-19 PNA.  He was not hospitalized, but had a severe bout with it.  In 2/21, he was admitted with GI bleeding from gastric ulcers and new atrial fibrillation. He had EGD with clipping of ulcers.  Eventually, he was started on Eliquis  and had DCCV to NSR.  He additionally was noted to have RLE DVT in 2/21.  Echo in 2/21 hospitalization showed EF 25-30%.  Lexiscan  Cardiolite showed inferior scar versus artifact and no ischemia.  Repeat echo in 3/21 showed EF still 30-35%.   He had ERAF after 3/21 DCCV and was re-admitted in 5/21 for Tikosyn  initiation.  He had repeat DCCV to NSR this admission.  He was only on Tikosyn  125 mcg bid at time of discharge.  He went back into atrial fibrillation again and he was cardioverted to NSR later in 5/21.   Echo in 6/21 showed improved EF 40-45%, RV normal.    He was planned for atrial fibrillation ablation in 7/21 but was found to have LA appendage thrombus on TEE, ?compliance with Eliquis .  Tikosyn  was stopped, Coreg  was increased to 12.5 mg bid.    TEE in 8/21 showed EF 35-40%, global HK, mildly decreased RV function, no LAA thrombus.  Patient was cardioverted to NSR.   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    In 3/22, he had atrial fibrillation ablation. TEE at that time showed EF 30-35%, moderate RV dysfunction.   Echo in 8/22 showed EF 40-45%, diffuse hypokinesis, normal RV.   Echo 2/24, EF remains 40-45%, mild LV dilation, mildly decreased RV systolic function.   Echo was done today  and reviewed, EF 40-45% with mild LVH, normal RV, IVC normal.   Here today for followup with his wife.  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  Does some work in [employer] as well.  No exertional dyspnea. He does get tired with push-mowing in his yard.  No chest pain.  BP is low today but he denies lightheadedness.  No orthopnea/PND.  ECG (personally reviewed): sinus brady 50, LAFB  Labs (2/24): Cr 1.82, K 4.6, LDL 54, Hgb 13.7 Labs (4/25): K 4.9, creatinine 1.97, LDL 43  PMH: 1. Atrial fibrillation: Paroxysmal.  - DCCV to NSR in 3/21, ERAF.   - Admitted in 5/21 for Tikosyn  intiation and repeat DCCV to NSR.  - LAA thrombus in 7/21, atrial fibrillation ablation not done and Tikosyn  stopped.  - Amiodarone  started, repeat TEE in 8/21 with no thrombus and DCCV to NSR.  - Atrial fibrillation ablation 3/22 2. 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 Renal cell carcinoma s/p right nephrectomy 4. CKD: Stage 3. 5. Hyperlipidemia.  6. H/o COVID-19 PNA in 1/21.  7. H/o GI bleeding from gastric ulcers in 3/21.  8. Cardiomyopathy: Echo (2/21) with EF 25-30%.  - Cardiolite (2/21) with inferior scar, no ischemia.  - Echo (3/21): EF 30-35%, global hypokinesis, mild MR.  - Echo (6/21): EF 40-45%, RV normal size and systolic function, PASP 36, IVC dilated.  - TEE (8/21): EF 35-40%,  diffuse hypokinesis, mildly decreased RV systolic function.  - LHC/RHC (10/21): 40% D2 stenosis, 40% PDA stenosis; mean RA 18, PA 51/23 mean 32, mean PCWP 22, CI 2.2 Thermo/1.9 Fick, PVR 1.9 WU.  - TEE (2/22): EF 30-35%, diffuse hypokinesis, moderately decreased RV systolic function.  - Echo (8/22): EF 40-45%, diffuse hypokinesis, normal RV.  - Echo (2/24): EF 40-45%, mild LV dilation, mildly decreased RV systolic function.  - Echo (10/25): EF 40-45% with mild LVH, normal RV, IVC normal.  9. RLE DVT in 2/21 post-COVID-19 PNA.   Social History   Socioeconomic History   Marital status: Married    Spouse name:  Not on file   Number of children: Not on file   Years of education: Not on file   Highest education level: Not on file  Occupational History   Occupation: truck driver  Tobacco Use   Smoking status: Former    Current packs/day: 0.00    Average packs/day: 2.0 packs/day for 35.0 years (70.0 ttl pk-yrs)    Types: Cigarettes    Start date: 42    Quit date: 2000    Years since quitting: 25.8   Smokeless tobacco: Never  Vaping Use   Vaping status: Never Used  Substance and Sexual Activity   Alcohol use: Yes    Alcohol/week: 1.0 - 2.0 standard drink of alcohol    Types: 1 - 2 Cans of beer per week    Comment: Socially   Drug use: No   Sexual activity: Not on file  Other Topics Concern   Not on file  Social History Narrative   Lives with wife in a one story home.  Has 3 children.     Works as a naval architect.     Education: 12th grade.   Social Drivers of Corporate Investment Banker Strain: Not on file  Food Insecurity: Not on file  Transportation Needs: Not on file  Physical Activity: Not on file  Stress: Not on file  Social Connections: Not on file  Intimate Partner Violence: Not on file   Family History  Problem Relation Age of Onset   Cancer Mother    Heart attack Father    Heart attack Brother    Heart disease Sister    Stroke Paternal Grandmother    ROS: All systems reviewed and negative except as per HPI.   Current Outpatient Medications  Medication Sig Dispense Refill   ACCU-CHEK GUIDE test strip daily. for testing     Accu-Chek Softclix Lancets lancets      acetaminophen  (TYLENOL ) 500 MG tablet Take 1,000 mg by mouth every 6 (six) hours as needed for mild pain or headache.     apixaban  (ELIQUIS ) 5 MG TABS tablet Take 1 tablet (5 mg total) by mouth 2 (two) times daily. 60 tablet 11   ascorbic acid  (VITAMIN C) 1000 MG tablet Take 1,000 mg by mouth every evening.      atorvastatin  (LIPITOR) 80 MG tablet Take 1 tablet (80 mg total) by mouth daily. NEEDS FOLLOW  UP APPOINTMENT FOR MORE REFILLS 30 tablet 0   Blood Glucose Monitoring Suppl (ACCU-CHEK GUIDE ME) w/Device KIT See admin instructions.     carvedilol  (COREG ) 12.5 MG tablet Take 1 tablet (12.5 mg total) by mouth 2 (two) times daily. NEEDS FOLLOW UP APPOINTMENT FOR MORE REFILLS 180 tablet 0   ezetimibe  (ZETIA ) 10 MG tablet Take 1 tablet (10 mg total) by mouth daily. 30 tablet 3   FARXIGA  10 MG TABS tablet  TAKE 1 TABLET BY MOUTH EVERY DAY 30 tablet 6   Garlic 1000 MG CAPS Take 1,000 mg by mouth every evening.     Glucosamine HCl 1000 MG TABS Take 1,000 mg by mouth every evening.     Krill Oil 500 MG CAPS Take 1 capsule by mouth daily.     Multiple Vitamin (MULTIVITAMIN WITH MINERALS) TABS tablet Take 1 tablet by mouth daily with breakfast.      Omega-3 Fatty Acids (FISH OIL PO) Take 350 mg by mouth every evening.      sacubitril -valsartan  (ENTRESTO ) 24-26 MG Take 1 tablet by mouth 2 (two) times daily. 60 tablet 11   Saw Palmetto , Serenoa repens, (SAW PALMETTO  PO) Take 540 mg by mouth every evening.      sildenafil  (VIAGRA ) 50 MG tablet Take 1 tablet (50 mg total) by mouth daily as needed for erectile dysfunction. 10 tablet 3   spironolactone  (ALDACTONE ) 25 MG tablet Take 1 tablet (25 mg total) by mouth daily. 30 tablet 6   torsemide  (DEMADEX ) 10 MG tablet TAKE 1 TABLET BY MOUTH AS NEEDED FOR SWELLING OR EDEMA 30 tablet 6   tamsulosin  (FLOMAX ) 0.4 MG CAPS capsule 0.4 mg.     No current facility-administered medications for this encounter.   BP 98/64   Pulse (!) 53   Ht 6' 1 (1.854 m)   Wt 116.5 kg (256 lb 12.8 oz)   SpO2 97%   BMI 33.88 kg/m  PHYSICAL EXAM: General: NAD Neck: No JVD, no thyromegaly or thyroid  nodule.  Lungs: Clear to auscultation bilaterally with normal respiratory effort. CV: Nondisplaced PMI.  Heart regular S1/S2, no S3/S4, no murmur.  No peripheral edema.  No carotid bruit.  Normal pedal pulses.  Abdomen: Soft, nontender, no hepatosplenomegaly, no distention.  Skin:  Intact without lesions or rashes.  Neurologic: Alert and oriented x 3.  Psych: Normal affect. Extremities: No clubbing or cyanosis.  HEENT: Normal.    Wt Readings from Last 3 Encounters:  08/05/24 116.5 kg (256 lb 12.8 oz)  01/22/24 113.5 kg (250 lb 3.2 oz)  03/14/23 116.3 kg (256 lb 6.4 oz)    Assessment/Plan: 1. Chronic systolic CHF: Nonischemic cardiomyopathy.  Echo in 2/21 with EF 25-30%, echo in 3/21 with EF 30-35%.  Cardiolite with inferior scar vs artifact, no ischemia. Cath in 10/21 with nonobstructive CAD.  Cause of cardiomyopathy is uncertain.  Could be related to atrial fibrillation (though not generally in RVR), cannot rule out cardiomyopathy related to myocarditis (?COVID-19 myocarditis).  Repeat echo in 6/21 showed EF up to 40-45%.  TEE in 8/21 with EF 35-40%.  TEE in 3/22 with EF 30-35% and moderate RV dysfunction.  Echo in 8/22 showed EF 40-45%.  Echo 2/24 and again in 10/25 showed EF 40-45%. NYHA class I, not volume overloaded on exam.  BP too low to titrate meds.  - Continue Coreg  12.5 mg bid.  - Continue Entresto  24/26 bid.  - Continue spironolactone  25 mg daily.  - Continue dapagliflozin  10 mg daily.   - He has not needed to use prn torsemide .  - EF is out of range for ICD.  Not CRT candidate with narrow QRS.   - BMET/BNP today.  2. Atrial fibrillation:  DCCV x 2 on Tikosyn  and DCCV x 1 on amiodarone .  He finally had atrial fibrillation ablation in 3/22.  He is off amiodarone . He is in NSR today, no palpitations.  - Continue Coreg   - Continue Eliquis  5 mg bid.  3. CKD: Stage  3   - on Farxiga   - BMET today.   4. Hyperlipidemia: Nonobstructive CAD.  - Continue atorvastatin , good lipids in 4/25.   F/u in 4 months with APP.   I spent 31 minutes reviewing records, interviewing/examining patient, directing echo, and managing orders.   Melvin Ramirez,  08/05/2024

## 2024-08-31 NOTE — Telephone Encounter (Signed)
Pt aware of results via wife.

## 2024-09-11 ENCOUNTER — Ambulatory Visit (HOSPITAL_COMMUNITY)
Admission: RE | Admit: 2024-09-11 | Discharge: 2024-09-11 | Disposition: A | Source: Ambulatory Visit | Attending: Internal Medicine

## 2024-09-11 DIAGNOSIS — I5042 Chronic combined systolic (congestive) and diastolic (congestive) heart failure: Secondary | ICD-10-CM

## 2024-09-11 LAB — BASIC METABOLIC PANEL WITH GFR
Anion gap: 8 (ref 5–15)
BUN: 29 mg/dL — ABNORMAL HIGH (ref 8–23)
CO2: 25 mmol/L (ref 22–32)
Calcium: 8.8 mg/dL — ABNORMAL LOW (ref 8.9–10.3)
Chloride: 106 mmol/L (ref 98–111)
Creatinine, Ser: 2.23 mg/dL — ABNORMAL HIGH (ref 0.61–1.24)
GFR, Estimated: 30 mL/min — ABNORMAL LOW (ref >60)
Glucose, Bld: 162 mg/dL — ABNORMAL HIGH (ref 70–99)
Potassium: 4.5 mmol/L (ref 3.5–5.1)
Sodium: 139 mmol/L (ref 135–145)

## 2024-10-29 ENCOUNTER — Other Ambulatory Visit (HOSPITAL_COMMUNITY): Payer: Self-pay | Admitting: Physician Assistant

## 2024-10-29 ENCOUNTER — Encounter (HOSPITAL_COMMUNITY): Payer: Self-pay | Admitting: Cardiology

## 2024-10-29 MED ORDER — EZETIMIBE 10 MG PO TABS: 10.0000 mg | ORAL_TABLET | Freq: Every day | ORAL | 3 refills | Status: DC

## 2024-11-02 ENCOUNTER — Other Ambulatory Visit (HOSPITAL_COMMUNITY): Payer: Self-pay | Admitting: Cardiology

## 2024-11-19 ENCOUNTER — Ambulatory Visit (HOSPITAL_COMMUNITY)

## 2024-12-04 ENCOUNTER — Ambulatory Visit (HOSPITAL_COMMUNITY)
